# Patient Record
Sex: Female | Born: 1995 | Race: White | Hispanic: No | Marital: Single | Smoking: Never smoker
Health system: Southern US, Community
[De-identification: ages and names within clinical notes are randomized; demographics above are authoritative.]

## PROBLEM LIST (undated history)

## (undated) DIAGNOSIS — F419 Anxiety disorder, unspecified: Secondary | ICD-10-CM

## (undated) DIAGNOSIS — R609 Edema, unspecified: Secondary | ICD-10-CM

## (undated) DIAGNOSIS — R0602 Shortness of breath: Secondary | ICD-10-CM

## (undated) DIAGNOSIS — G43909 Migraine, unspecified, not intractable, without status migrainosus: Secondary | ICD-10-CM

## (undated) DIAGNOSIS — M549 Dorsalgia, unspecified: Secondary | ICD-10-CM

## (undated) DIAGNOSIS — F329 Major depressive disorder, single episode, unspecified: Secondary | ICD-10-CM

## (undated) DIAGNOSIS — M2550 Pain in unspecified joint: Secondary | ICD-10-CM

## (undated) DIAGNOSIS — F32A Depression, unspecified: Secondary | ICD-10-CM

## (undated) DIAGNOSIS — M25579 Pain in unspecified ankle and joints of unspecified foot: Secondary | ICD-10-CM

## (undated) HISTORY — DX: Edema, unspecified: R60.9

## (undated) HISTORY — DX: Shortness of breath: R06.02

## (undated) HISTORY — DX: Migraine, unspecified, not intractable, without status migrainosus: G43.909

## (undated) HISTORY — DX: Pain in unspecified joint: M25.50

## (undated) HISTORY — PX: WISDOM TOOTH EXTRACTION: SHX21

## (undated) HISTORY — DX: Major depressive disorder, single episode, unspecified: F32.9

## (undated) HISTORY — DX: Depression, unspecified: F32.A

## (undated) HISTORY — DX: Anxiety disorder, unspecified: F41.9

## (undated) HISTORY — DX: Pain in unspecified ankle and joints of unspecified foot: M25.579

---
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
## 2015-01-21 ENCOUNTER — Other Ambulatory Visit: Payer: Self-pay | Admitting: Family Medicine

## 2015-01-25 ENCOUNTER — Encounter: Payer: Self-pay | Admitting: Diagnostic Neuroimaging

## 2015-01-25 ENCOUNTER — Ambulatory Visit (INDEPENDENT_AMBULATORY_CARE_PROVIDER_SITE_OTHER): Payer: Federal, State, Local not specified - PPO | Admitting: Diagnostic Neuroimaging

## 2015-01-25 VITALS — BP 111/78 | HR 93 | Ht 67.0 in | Wt 248.0 lb

## 2015-01-25 DIAGNOSIS — G43009 Migraine without aura, not intractable, without status migrainosus: Secondary | ICD-10-CM

## 2015-01-25 MED ORDER — TOPIRAMATE 50 MG PO TABS: 50.0000 mg | ORAL_TABLET | Freq: Two times a day (BID) | ORAL | Status: DC

## 2015-01-25 NOTE — Patient Instructions (Signed)
Thank you for coming to see [place] at [facility]. I hope we have been able to provide you high quality care today.  You may receive a patient satisfaction survey over the next few weeks. We would appreciate your feedback and comments so that we may continue to improve ourselves and the health of our patients.  - continue current medication (topiramate)   ~~~~~~~~~~~~~~~~~~~~~~~~~~~~~~~~~~~~~~~~~~~~~~~~~~~~~~~~~~~~~~~~~  DR. PENUMALLI'S GUIDE TO HAPPY AND HEALTHY LIVING These are some of my general health and wellness recommendations. Some of them may apply to you better than others. Please use common sense as you try these suggestions and feel free to ask me any questions.   ACTIVITY/FITNESS Mental, social, emotional and physical stimulation are very important for brain and body health. Try learning a new activity (arts, music, language, sports, games).  Keep moving your body to the best of your abilities. You can do this at home, inside or outside, the park, community center, gym or anywhere you like. Consider a physical therapist or personal trainer to get started. Consider the app Sworkit. Fitness trackers such as smart-watches, smart-phones or Fitbits can help as well.   NUTRITION Eat more plants: colorful vegetables, nuts, seeds and berries.  Eat less sugar, salt, preservatives and processed foods.  Avoid toxins such as cigarettes and alcohol.  Drink water when you are thirsty. Warm water with a slice of lemon is an excellent morning drink to start the day.  Consider these websites for more information The Nutrition Source (https://www.henry-hernandez.biz/) Precision Nutrition (WindowBlog.ch)   RELAXATION Consider practicing mindfulness meditation or other relaxation techniques such as deep breathing, prayer, yoga, tai chi, massage. See website mindful.org or the apps Headspace or Calm to help get  started.   SLEEP Try to get at least 7-8+ hours sleep per day. Regular exercise and reduced caffeine will help you sleep better. Practice good sleep hygeine techniques. See website sleep.org for more information.   PLANNING Prepare estate planning, living will, healthcare POA documents. Sometimes this is best planned with the help of an attorney. Theconversationproject.org and agingwithdignity.org are excellent resources.

## 2015-01-25 NOTE — Progress Notes (Signed)
[facility]  PATIENT: Kylie Ware DOB: [birth year]  REFERRING CLINICIAN: Ruthe Mannan HISTORY FROM: patient REASON FOR VISIT: follow up    HISTORICAL  CHIEF COMPLAINT:  Chief Complaint  Patient presents with  . Migraine    rm 7, "HA related to lack of sleep"  . Follow-up    5 month    HISTORY OF PRESENT ILLNESS:   UPDATE 01/25/15: Since last visit, doing well. Occ has HA (1 every 2 weeks) but milder. Overall stable. Lack of sleep is still a trigger for HA. Currently home for winter break.  UPDATE 08/25/14: Since last visit, HA resolved, since increasing TPX to  BID. Some mild word finding diff. Overall doing well.   PRIOR HPI (07/13/14): 19 year old right-handed female here for evaluation of headaches. March 2016 patient had onset of daily headaches, bitemporal, retro-orbital, pressure and throbbing sensation with blurred vision, dizziness, balance difficulties. Her vision difficulties are described as if she is looking to the car windshield with rain, without using the windshield wipers. She reports some photophobia, phonophobia, nausea. No vomiting. Sometimes she sees "clear sparkles" during the headaches. Headaches may last all day or several hours at a time. She's had some headache at least on a daily basis since March. Patient had one episode of headache in ninth grade, with blurred vision and emergency room evaluation. She is not sure if she was ever diagnosed with migraine headache. She has family history of migraine in her father, who takes topiramate. January 2016 patient had onset of sleep disturbance, insomnia, related to change in her roommate sleeping pattern. Patient was then used to having a roommate, and when she was having to sleep alone, she had more difficulty. Typically she lays down around midnight, falls asleep around 2 AM, wakes up 2-3 times in the night, and then wakes up at 11:00 in the morning or 1:00 in the afternoon. Patient also  reports onset of depression and anxiety symptoms in August 2015. She is on medication and seeing a psychiatrist. No other changes in caffeine, stress, diet or exercise.   REVIEW OF SYSTEMS: Full 14 system review of systems performed and notable only for dizziness headache fatigue insomnia nausea blurred vision.    ALLERGIES: No Known Allergies  HOME MEDICATIONS: Outpatient Prescriptions Prior to Visit  Medication Sig Dispense Refill  . JUNEL 1/20 1-20 MG-MCG tablet TAKE 1 TABLET BY MOUTH DAILY 21 tablet 3  . sertraline (ZOLOFT) 25 MG tablet TAKE 1 TABLET BY MOUTH DAILY 30 tablet 9  . topiramate (TOPAMAX) 50 MG tablet Take 1 tablet (50 mg total) by mouth 2 (two) times daily. 180 tablet 4   No facility-administered medications prior to visit.    PAST MEDICAL HISTORY: Past Medical History  Diagnosis Date  . Migraines   . Depression   . Anxiety     PAST SURGICAL HISTORY: History reviewed. No pertinent past surgical history.  FAMILY HISTORY: Family History  Problem Relation Age of Onset  . Diabetes Father   . Cancer Maternal Grandmother 60    breast CA  . Cancer Mother     breast    SOCIAL HISTORY:  Social History   Social History  . Marital Status: Single    Spouse Name: N/A  . Number of Children: N/A  . Years of Education: N/A   Occupational History  . Not on file.   Social History Main Topics  . Smoking status: Never Smoker   . Smokeless tobacco: Not on file  . Alcohol Use:  No  . Drug Use: No  . Sexual Activity: Not on file   Other Topics Concern  . Not on file   Social History Narrative   Entering her senior year at [employer].   [employer].   Non smoking.      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     PHYSICAL EXAM  Filed Vitals:   01/25/15 1335  BP: 111/78  Pulse: 93  Height:  (1.702 m)  Weight: 248 lb (112.492 kg)   Wt Readings from Last 3 Encounters:  01/25/15 248 lb (112.492 kg) (99 %*, Z = 2.50)  08/25/14 232 lb (105.235 kg) (99 %*,  Z = 2.33)  07/13/14 237 lb 6.4 oz (107.684 kg) (99 %*, Z = 2.38)   * Growth percentiles are based on CDC 2-20 Years data.   Body mass index is 38.83 kg/(m^2).  GENERAL EXAM: Patient is in no distress; well developed, nourished and groomed; neck is supple; SOFT SPOKEN, CALM.  CARDIOVASCULAR: Regular rate and rhythm, no murmurs, no carotid bruits  NEUROLOGIC: MENTAL STATUS: awake, alert, language fluent, comprehension intact, naming intact, fund of knowledge appropriate CRANIAL NERVE: pupils equal and reactive to light, visual fields full to confrontation, extraocular muscles intact, no nystagmus, facial sensation and strength symmetric, hearing intact, palate elevates symmetrically, uvula midline, shoulder shrug symmetric, tongue midline. MOTOR: normal bulk and tone, full strength in the BUE, BLE SENSORY: normal and symmetric to light touch COORDINATION: finger-nose-finger, fine finger movements normal REFLEXES: deep tendon reflexes present and symmetric GAIT/STATION: narrow based gait; able to walk tandem; romberg is negative    DIAGNOSTIC DATA (LABS, IMAGING, TESTING) - I reviewed patient records, labs, notes, testing and imaging myself where available.  No results found for: WBC, HGB, HCT, MCV, PLT    Component Value Date/Time   NA 138 08/02/2012 1440   K 4.3 08/02/2012 1440   CL 104 08/02/2012 1440   CO2 28 08/02/2012 1440   GLUCOSE 89 08/02/2012 1440   BUN 13 08/02/2012 1440   CREATININE 0.60 08/02/2012 1440   CALCIUM 9.7 08/02/2012 1440   PROT 6.8 08/02/2012 1440   ALBUMIN 4.1 08/02/2012 1440   AST 12 08/02/2012 1440   ALT 15 08/02/2012 1440   ALKPHOS 85 08/02/2012 1440   BILITOT 0.2* 08/02/2012 1440   Lab Results  Component Value Date   CHOL 167 08/02/2012   HDL 68 08/02/2012   LDLCALC 86 08/02/2012   TRIG 64 08/02/2012   CHOLHDL 2.5 08/02/2012   Lab Results  Component Value Date   HGBA1C 5.4 08/02/2012   No results found for: VITAMINB12 No results  found for: TSH  11/26/09 CT head - normal [I reviewed images myself and agree with interpretation. -VRP]   08/25/14 Essentially normal MRI brain (with and without) demonstrating: 1. Minimal periventricular capping/gliosis and a single right frontal punctate focus of non-specific gliosis. These findings are non-specific and may be within normal limits.  2. No abnormal enhancing lesions. No acute findings.     ASSESSMENT AND PLAN  19 y.o. year old female here with new onset headaches since March 2016, with migraine features. Doing well on TPX  BID.  Dx: migraine with aura   PLAN: I spent 15 minutes of face to face time with patient. Greater than 50% of time was spent in counseling and coordination of care with patient. In summary we discussed:  - continue topiramate  BID - medicine benefits and side effects reviewed  Meds ordered this encounter  Medications  .  topiramate (TOPAMAX) 50 MG tablet    Sig: Take 1 tablet (50 mg total) by mouth 2 (two) times daily.    Dispense:  180 tablet    Refill:  4   Return in about 6 months (around 07/26/2015).    Suanne MarkerVIKRAM R. Shavonda Wiedman, MD 01/25/2015, 2:09 PM Certified in Neurology, Neurophysiology and Neuroimaging  [facility] [address], [address] [place], [place] [address] [phone]

## 2015-01-30 ENCOUNTER — Other Ambulatory Visit: Payer: Self-pay | Admitting: Family Medicine

## 2015-01-30 ENCOUNTER — Other Ambulatory Visit: Payer: Self-pay | Admitting: Diagnostic Neuroimaging

## 2015-03-15 ENCOUNTER — Other Ambulatory Visit: Payer: Self-pay | Admitting: Family Medicine

## 2015-03-16 NOTE — Telephone Encounter (Signed)
Lm on pts vm and informed her OV is required

## 2015-03-17 ENCOUNTER — Telehealth: Payer: Self-pay | Admitting: Family Medicine

## 2015-03-17 NOTE — Telephone Encounter (Signed)
Spoke to pt and informed her OV is required. Pt unable to schedule at the present time. Pt has not had f/u since 01/2014. #7 sent to pharmacy to last until she can be seen

## 2015-03-17 NOTE — Telephone Encounter (Signed)
Pts father (DPR not signed) left v/m requesting cb about pt not being able to get refill of zoloft until can come back in to office for appt; pt is student at [employer] and will have break in March.

## 2015-03-18 NOTE — Telephone Encounter (Signed)
Spoke to pt and advised father is not on [place]. Informed pt that i had spoken to Dr Dayton Martes, and a follow is required as indicated on her last Rx. Pt advised #7 was sent to pharmacy and once she has scheduled an appt, we can fill Rx until appt date, should she have to come in March. Pt not wanting to schedule an appt

## 2015-03-20 ENCOUNTER — Other Ambulatory Visit: Payer: Self-pay | Admitting: Family Medicine

## 2015-03-29 ENCOUNTER — Encounter: Payer: Self-pay | Admitting: Family Medicine

## 2015-03-29 ENCOUNTER — Ambulatory Visit (INDEPENDENT_AMBULATORY_CARE_PROVIDER_SITE_OTHER): Payer: Federal, State, Local not specified - PPO | Admitting: Family Medicine

## 2015-03-29 VITALS — BP 122/80 | HR 100 | Temp 98.4°F

## 2015-03-29 DIAGNOSIS — F329 Major depressive disorder, single episode, unspecified: Secondary | ICD-10-CM

## 2015-03-29 DIAGNOSIS — Z01419 Encounter for gynecological examination (general) (routine) without abnormal findings: Secondary | ICD-10-CM | POA: Insufficient documentation

## 2015-03-29 DIAGNOSIS — F419 Anxiety disorder, unspecified: Secondary | ICD-10-CM

## 2015-03-29 DIAGNOSIS — Z0000 Encounter for general adult medical examination without abnormal findings: Secondary | ICD-10-CM

## 2015-03-29 DIAGNOSIS — F418 Other specified anxiety disorders: Secondary | ICD-10-CM | POA: Diagnosis not present

## 2015-03-29 DIAGNOSIS — Z23 Encounter for immunization: Secondary | ICD-10-CM

## 2015-03-29 DIAGNOSIS — E669 Obesity, unspecified: Secondary | ICD-10-CM | POA: Diagnosis not present

## 2015-03-29 DIAGNOSIS — F32A Depression, unspecified: Secondary | ICD-10-CM

## 2015-03-29 DIAGNOSIS — Z3009 Encounter for other general counseling and advice on contraception: Secondary | ICD-10-CM

## 2015-03-29 LAB — TSH: TSH: 3.84 u[IU]/mL (ref 0.35–5.50)

## 2015-03-29 LAB — COMPREHENSIVE METABOLIC PANEL
ALBUMIN: 4.1 g/dL (ref 3.5–5.2)
ALK PHOS: 64 U/L (ref 39–117)
ALT: 18 U/L (ref 0–35)
AST: 13 U/L (ref 0–37)
BILIRUBIN TOTAL: 0.2 mg/dL (ref 0.2–1.2)
BUN: 10 mg/dL (ref 6–23)
CO2: 21 mEq/L (ref 19–32)
CREATININE: 0.66 mg/dL (ref 0.40–1.20)
Calcium: 9.4 mg/dL (ref 8.4–10.5)
Chloride: 109 mEq/L (ref 96–112)
GFR: 121.35 mL/min (ref >60.00)
GLUCOSE: 94 mg/dL (ref 70–99)
Potassium: 3.7 mEq/L (ref 3.5–5.1)
SODIUM: 138 meq/L (ref 135–145)
TOTAL PROTEIN: 7.2 g/dL (ref 6.0–8.3)

## 2015-03-29 LAB — CBC WITH DIFFERENTIAL/PLATELET
BASOS ABS: 0 10*3/uL (ref 0.0–0.1)
Basophils Relative: 0.5 % (ref 0.0–3.0)
EOS ABS: 0.2 10*3/uL (ref 0.0–0.7)
EOS PCT: 3.2 % (ref 0.0–5.0)
HCT: 39.3 % (ref 36.0–46.0)
HEMOGLOBIN: 12.9 g/dL (ref 12.0–15.0)
Lymphocytes Relative: 32.7 % (ref 12.0–46.0)
Lymphs Abs: 2.3 10*3/uL (ref 0.7–4.0)
MCHC: 32.9 g/dL (ref 30.0–36.0)
MCV: 83.6 fl (ref 78.0–100.0)
MONO ABS: 0.7 10*3/uL (ref 0.1–1.0)
Monocytes Relative: 9.7 % (ref 3.0–12.0)
NEUTROS PCT: 53.9 % (ref 43.0–77.0)
Neutro Abs: 3.9 10*3/uL (ref 1.4–7.7)
Platelets: 342 10*3/uL (ref 150.0–400.0)
RBC: 4.7 Mil/uL (ref 3.87–5.11)
RDW: 14.5 % (ref 11.5–14.6)
WBC: 7.2 10*3/uL (ref 4.5–10.5)

## 2015-03-29 LAB — LIPID PANEL
CHOLESTEROL: 212 mg/dL — AB (ref 0–200)
HDL: 76.9 mg/dL (ref >39.00)
LDL Cholesterol: 116 mg/dL — ABNORMAL HIGH (ref 0–99)
NONHDL: 135.52
Total CHOL/HDL Ratio: 3
Triglycerides: 98 mg/dL (ref 0.0–149.0)
VLDL: 19.6 mg/dL (ref 0.0–40.0)

## 2015-03-29 MED ORDER — SERTRALINE HCL 25 MG PO TABS: 25.0000 mg | ORAL_TABLET | Freq: Every day | ORAL | Status: DC

## 2015-03-29 NOTE — Addendum Note (Signed)
Addended by: Eual Fines on: 03/29/2015 09:44 AM   Modules accepted: Orders

## 2015-03-29 NOTE — Progress Notes (Signed)
Pre visit review using our clinic review tool, if applicable. No additional management support is needed unless otherwise documented below in the visit note. 

## 2015-03-29 NOTE — Progress Notes (Signed)
Subjective:    Patient ID: Kylie Ware, female    DOB: Jan 24, 1996, 20 y.o.   MRN: [id number]  HPI  Very pleasant 20 yo female here for CPX and follow up of chronic medical conditions.  Received gardasil series- see scanned immunization records under "media" tab.  She is taking Junel OCPs which have helped with her menorrhagia and cramping. Sexually active with her boyfriend.  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 Attending App- she enjoys it.  Obesity- has been an issue her entire life.  Admits to snacking too much and making poor food choices.  Does not like to exercise. Likes playing video games.  Migraines- followed by neurology. Last saw Dr. Marjory Lies on 01/24/15.  Note reviewed.  Advised to continue Topamax 50 mg twice daily for migraine prevention. Migraines are stress induced.  Improved.  Zoloft- symptoms are well controlled.  Denies any anxiety or depression.   Wt Readings from Last 3 Encounters:  01/25/15 248 lb (112.492 kg) (99 %*, Z = 2.50)  08/25/14 232 lb (105.235 kg) (99 %*, Z = 2.33)  07/13/14 237 lb 6.4 oz (107.684 kg) (99 %*, Z = 2.38)   * Growth percentiles are based on CDC 2-20 Years data.     Lab Results  Component Value Date   CHOL 167 08/02/2012   HDL 68 08/02/2012   LDLCALC 86 08/02/2012   TRIG 64 08/02/2012   CHOLHDL 2.5 08/02/2012   Lab Results  Component Value Date   CREATININE 0.60 08/02/2012   Lab Results  Component Value Date   NA 138 08/02/2012   K 4.3 08/02/2012   CL 104 08/02/2012   CO2 28 08/02/2012   Lab Results  Component Value Date   HGBA1C 5.4 08/02/2012    Patient Active Problem List   Diagnosis Date Noted  . Well woman exam 03/29/2015  . Anxiety and depression 01/20/2014  . Encounter for other general counseling or advice on contraception 01/20/2014  . Obesity, unspecified 08/02/2012   Past Medical History  Diagnosis Date  . Migraines   . Depression   . Anxiety    No past surgical history on file. Social  History  Substance Use Topics  . Smoking status: Never Smoker   . Smokeless tobacco: Never Used  . Alcohol Use: No   Family History  Problem Relation Age of Onset  . Diabetes Father   . Cancer Maternal Grandmother 60    breast CA  . Cancer Mother     breast   No Known Allergies Current Outpatient Prescriptions on File Prior to Visit  Medication Sig Dispense Refill  . JUNEL 1/20 1-20 MG-MCG tablet TAKE 1 TABLET BY MOUTH DAILY 21 tablet 3  . sertraline (ZOLOFT) 25 MG tablet Take 1 tablet (25 mg total) by mouth daily. OFFICE VISIT REQUIRED FOR ADDITIONAL REFILLS 7 tablet 0  . topiramate (TOPAMAX) 50 MG tablet TAKE 1 TABLET (50 MG TOTAL) BY MOUTH 2 (TWO) TIMES DAILY. 60 tablet 12   No current facility-administered medications on file prior to visit.   The PMH, PSH, Social History, Family History, Medications, and allergies have been reviewed in [facility], and have been updated if relevant.   Review of Systems  Constitutional: Negative.   HENT: Negative.   Respiratory: Negative.   Cardiovascular: Negative.   Gastrointestinal: Negative.   Endocrine: Negative.   Genitourinary: Negative.   Musculoskeletal: Negative.   Allergic/Immunologic: Negative.   Neurological: Negative.   Hematological: Negative.   Psychiatric/Behavioral: Negative.   All other systems reviewed  and are negative.      Objective:   Physical Exam BP 122/80 mmHg  Pulse 100  Temp(Src) 98.4 F (36.9 C) (Oral)  LMP 03/24/2015  General:  Obese, Well-developed,well-nourished,in no acute distress; alert,appropriate and cooperative throughout examination Head:  normocephalic and atraumatic.   Eyes:  vision grossly intact, pupils equal, pupils round, and pupils reactive to light.   Ears:  R ear normal and L ear normal.   Nose:  no external deformity.   Mouth:  good dentition.   Lungs:  Normal respiratory effort, chest expands symmetrically. Lungs are clear to auscultation, no crackles or wheezes. Heart:  Normal  rate and regular rhythm. S1 and S2 normal without gallop, murmur, click, rub or other extra sounds. Abdomen:  Bowel sounds positive,abdomen soft and non-tender without masses, organomegaly or hernias noted. Msk:  No deformity or scoliosis noted of thoracic or lumbar spine.   Extremities:  No clubbing, cyanosis, edema, or deformity noted with normal full range of motion of all joints.   Neurologic:  alert & oriented X3 and gait normal.   Skin:  Intact without suspicious lesions or rashes Cervical Nodes:  No lymphadenopathy noted Axillary Nodes:  No palpable lymphadenopathy Psych:  Cognition and judgment appear intact. Alert and cooperative with normal attention span and concentration. No apparent delusions, illusions, hallucinations    Assessment & Plan:

## 2015-03-29 NOTE — Assessment & Plan Note (Signed)
Well controlled on current dose of Zoloft. No changes made to rxs today.

## 2015-03-29 NOTE — Addendum Note (Signed)
Addended by: Dianne Dun on: 03/29/2015 09:39 AM   Modules accepted: Orders, SmartSet

## 2015-03-29 NOTE — Assessment & Plan Note (Signed)
Discussed dangers of smoking, alcohol, and drug abuse.  Also discussed sexual activity, pregnancy risk, and STD risk.  Encouraged to get regular exercise.  Labs today.

## 2015-03-29 NOTE — Patient Instructions (Signed)
Great to see you! Happy birthday! 

## 2015-03-30 ENCOUNTER — Encounter: Payer: Self-pay | Admitting: *Deleted

## 2015-05-24 ENCOUNTER — Other Ambulatory Visit: Payer: Self-pay | Admitting: Family Medicine

## 2015-07-01 ENCOUNTER — Ambulatory Visit (INDEPENDENT_AMBULATORY_CARE_PROVIDER_SITE_OTHER): Payer: Federal, State, Local not specified - PPO | Admitting: Family Medicine

## 2015-07-01 ENCOUNTER — Encounter: Payer: Self-pay | Admitting: Family Medicine

## 2015-07-01 VITALS — BP 114/72 | HR 93 | Temp 97.8°F | Wt 259.0 lb

## 2015-07-01 DIAGNOSIS — M62830 Muscle spasm of back: Secondary | ICD-10-CM

## 2015-07-01 MED ORDER — CYCLOBENZAPRINE HCL 5 MG PO TABS
5.0000 mg | ORAL_TABLET | Freq: Three times a day (TID) | ORAL | Status: DC | PRN
Start: 2015-07-01 — End: 2018-07-22

## 2015-07-01 NOTE — Patient Instructions (Signed)
Great to see you. You can take Ibuprofen up to 800 mg three times daily with food for next 1- 2 weeks. Flexeril as needed at bedtime for spasm.

## 2015-07-01 NOTE — Progress Notes (Signed)
SUBJECTIVE:  Kylie Ware is a 20 y.o. female who complains of an injury causing low back pain 1 week(s) ago. The pain is positional with bending or lifting, without radiation down the legs. Mechanism of injury: lifting heavy objects moving from her dorm. Symptoms have been intermittent since that time. Prior history of back problems: recurrent self limited episodes of low back pain in the past. There is no numbness in the legs.  Current Outpatient Prescriptions on File Prior to Visit  Medication Sig Dispense Refill  . JUNEL 1/20 1-20 MG-MCG tablet TAKE 1 TABLET BY MOUTH DAILY 21 tablet 3  . sertraline (ZOLOFT) 25 MG tablet Take 1 tablet (25 mg total) by mouth daily. 30 tablet 6  . topiramate (TOPAMAX) 50 MG tablet TAKE 1 TABLET (50 MG TOTAL) BY MOUTH 2 (TWO) TIMES DAILY. 60 tablet 12   No current facility-administered medications on file prior to visit.    No Known Allergies  Past Medical History  Diagnosis Date  . Migraines   . Depression   . Anxiety     No past surgical history on file.  Family History  Problem Relation Age of Onset  . Diabetes Father   . Cancer Maternal Grandmother 60    breast CA  . Cancer Mother     breast    Social History   Social History  . Marital Status: Single    Spouse Name: N/A  . Number of Children: N/A  . Years of Education: N/A   Occupational History  . Not on file.   Social History Main Topics  . Smoking status: Never Smoker   . Smokeless tobacco: Never Used  . Alcohol Use: No  . Drug Use: No  . Sexual Activity: Not on file   Other Topics Concern  . Not on file   Social History Narrative   Attending App   Sexually active   Non smoking.      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   The PMH, PSH, Social History, Family History, Medications, and [facility], and have been updated if relevant.  OBJECTIVE: BP 114/72 mmHg  Pulse 93  Temp(Src) 97.8 F (36.6 C) (Oral)  Wt 259 lb (117.482 kg)  SpO2  98%  Vital signs as noted above. Patient appears to be in mild to moderate pain, antalgic gait noted. Lumbosacral spine area reveals no local tenderness or mass. Painful and reduced LS ROM noted. Straight leg raise is negative bilaterally.  DTR's, motor strength and sensation normal, including heel and toe gait.  Peripheral pulses are palpable. Lumbar spine X-Ray: not indicated.   ASSESSMENT:  lumbar strain  PLAN: eRx sent for flexeril- discussed sedation precautions. For acute pain, rest, intermittent application of cold packs (later, may switch to heat, but do not sleep on heating pad), analgesics and muscle relaxants are recommended. Discussed longer term treatment plan of prn NSAID's and discussed a home back care exercise program with flexion exercise routine. Proper lifting with avoidance of heavy lifting discussed. Consider Physical Therapy and XRay studies if not improving. Call or return to clinic prn if these symptoms worsen or fail to improve as anticipated.

## 2015-07-01 NOTE — Progress Notes (Signed)
Pre visit review using our clinic review tool, if applicable. No additional management support is needed unless otherwise documented below in the visit note. 

## 2015-07-06 ENCOUNTER — Encounter: Payer: Self-pay | Admitting: Family Medicine

## 2015-07-07 ENCOUNTER — Other Ambulatory Visit: Payer: Self-pay | Admitting: Family Medicine

## 2015-07-07 DIAGNOSIS — M5416 Radiculopathy, lumbar region: Secondary | ICD-10-CM

## 2015-07-12 DIAGNOSIS — M545 Low back pain: Secondary | ICD-10-CM | POA: Diagnosis not present

## 2015-07-21 DIAGNOSIS — G8929 Other chronic pain: Secondary | ICD-10-CM | POA: Diagnosis not present

## 2015-07-21 DIAGNOSIS — M545 Low back pain: Secondary | ICD-10-CM | POA: Diagnosis not present

## 2015-07-26 ENCOUNTER — Encounter: Payer: Self-pay | Admitting: Diagnostic Neuroimaging

## 2015-07-26 ENCOUNTER — Ambulatory Visit (INDEPENDENT_AMBULATORY_CARE_PROVIDER_SITE_OTHER): Payer: Federal, State, Local not specified - PPO | Admitting: Diagnostic Neuroimaging

## 2015-07-26 VITALS — BP 110/70 | HR 96 | Ht 67.0 in | Wt 255.6 lb

## 2015-07-26 DIAGNOSIS — G43009 Migraine without aura, not intractable, without status migrainosus: Secondary | ICD-10-CM | POA: Diagnosis not present

## 2015-07-26 DIAGNOSIS — M5416 Radiculopathy, lumbar region: Secondary | ICD-10-CM | POA: Diagnosis not present

## 2015-07-26 MED ORDER — TOPIRAMATE 50 MG PO TABS: 50.0000 mg | ORAL_TABLET | Freq: Two times a day (BID) | ORAL | Status: DC

## 2015-07-26 NOTE — Progress Notes (Signed)
[facility]  PATIENT: Kylie Ware DOB: 29-Dec-1995  REFERRING CLINICIAN: Ruthe MannanAron, Talia HISTORY FROM: patient REASON FOR VISIT: follow up    HISTORICAL  CHIEF COMPLAINT:  Chief Complaint  Patient presents with  . Migraine    rm 7, "migraines less often now but my exams are over too, they were very stressful, caused nausea, vomiting, but I've worse in the past"  . Follow-up    6 month    HISTORY OF PRESENT ILLNESS:   UPDATE 07/26/15: Since last visit, doing well. Avg 1 HA per month. During classes and finals, HA were worse. Now doing better. Tolerating TPX.  UPDATE 01/25/15: Since last visit, doing well. Occ has HA (1 every 2 weeks) but milder. Overall stable. Lack of sleep is still a trigger for HA. Currently home for winter break.  UPDATE 08/25/14: Since last visit, HA resolved, since increasing TPX to 50mg  BID. Some mild word finding diff. Overall doing well.   PRIOR HPI (07/13/14): 20 year old right-handed female here for evaluation of headaches. March 2016 patient had onset of daily headaches, bitemporal, retro-orbital, pressure and throbbing sensation with blurred vision, dizziness, balance difficulties. Her vision difficulties are described as if she is looking to the car windshield with rain, without using the windshield wipers. She reports some photophobia, phonophobia, nausea. No vomiting. Sometimes she sees "clear sparkles" during the headaches. Headaches may last all day or several hours at a time. She's had some headache at least on a daily basis since March. Patient had one episode of headache in ninth grade, with blurred vision and emergency room evaluation. She is not sure if she was ever diagnosed with migraine headache. She has family history of migraine in her father, who takes topiramate. January 2016 patient had onset of sleep disturbance, insomnia, related to change in her roommate sleeping pattern. Patient was then used to having a roommate, and  when she was having to sleep alone, she had more difficulty. Typically she lays down around midnight, falls asleep around 2 AM, wakes up 2-3 times in the night, and then wakes up at 11:00 in the morning or 1:00 in the afternoon. Patient also reports onset of depression and anxiety symptoms in August 2015. She is on medication and seeing a psychiatrist. No other changes in caffeine, stress, diet or exercise.   REVIEW OF SYSTEMS: Full 14 system review of systems performed and negative except: loss of vision (with HA) memory loss dizzienss speech diff depression anxiety insomnia sleep talking.   ALLERGIES: No Known Allergies  HOME MEDICATIONS: Outpatient Prescriptions Prior to Visit  Medication Sig Dispense Refill  . cyclobenzaprine (FLEXERIL) 5 MG tablet Take 1 tablet (5 mg total) by mouth 3 (three) times daily as needed for muscle spasms. 30 tablet 1  . JUNEL 1/20 1-20 MG-MCG tablet TAKE 1 TABLET BY MOUTH DAILY 21 tablet 3  . sertraline (ZOLOFT) 25 MG tablet Take 1 tablet (25 mg total) by mouth daily. 30 tablet 6  . topiramate (TOPAMAX) 50 MG tablet TAKE 1 TABLET (50 MG TOTAL) BY MOUTH 2 (TWO) TIMES DAILY. 60 tablet 12   No facility-administered medications prior to visit.    PAST MEDICAL HISTORY: Past Medical History  Diagnosis Date  . Migraines   . Depression   . Anxiety     PAST SURGICAL HISTORY: No past surgical history on file.  FAMILY HISTORY: Family History  Problem Relation Age of Onset  . Diabetes Father   . Cancer Maternal Grandmother 60    breast CA  .  Cancer Mother     breast    SOCIAL HISTORY:  Social History   Social History  . Marital Status: Single    Spouse Name: N/A  . Number of Children: N/A  . Years of Education: N/A   Occupational History  . Not on file.   Social History Main Topics  . Smoking status: Never Smoker   . Smokeless tobacco: Never Used  . Alcohol Use: No  . Drug Use: No  . Sexual Activity: Not on file   Other Topics Concern    . Not on file   Social History Narrative   Attending App   Sexually active   Non smoking.      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     PHYSICAL EXAM  Filed Vitals:   07/26/15 1251  BP: 110/70  Pulse: 96  Height: 5\' 7"  (1.702 m)  Weight: 255 lb 9.6 oz (115.939 kg)   Wt Readings from Last 3 Encounters:  07/26/15 255 lb 9.6 oz (115.939 kg)  07/01/15 259 lb (117.482 kg)  01/25/15 248 lb (112.492 kg) (99 %*, Z = 2.50)   * Growth percentiles are based on CDC 2-20 Years data.   Body mass index is 40.02 kg/(m^2).  GENERAL EXAM: Patient is in no distress; well developed, nourished and groomed; neck is supple; SOFT SPOKEN, CALM.  CARDIOVASCULAR: Regular rate and rhythm, no murmurs, no carotid bruits  NEUROLOGIC: MENTAL STATUS: awake, alert, language fluent, comprehension intact, naming intact, fund of knowledge appropriate CRANIAL NERVE: pupils equal and reactive to light, visual fields full to confrontation, extraocular muscles intact, no nystagmus, facial sensation and strength symmetric, hearing intact, palate elevates symmetrically, uvula midline, shoulder shrug symmetric, tongue midline. MOTOR: normal bulk and tone, full strength in the BUE, BLE SENSORY: normal and symmetric to light touch COORDINATION: finger-nose-finger, fine finger movements normal REFLEXES: deep tendon reflexes present and symmetric GAIT/STATION: narrow based gait    DIAGNOSTIC DATA (LABS, IMAGING, TESTING) - I reviewed patient records, labs, notes, testing and imaging myself where available.  Lab Results  Component Value Date   WBC 7.2 03/29/2015   HGB 12.9 03/29/2015   HCT 39.3 03/29/2015   MCV 83.6 03/29/2015   PLT 342.0 03/29/2015      Component Value Date/Time   NA 138 03/29/2015 0954   K 3.7 03/29/2015 0954   CL 109 03/29/2015 0954   CO2 21 03/29/2015 0954   GLUCOSE 94 03/29/2015 0954   BUN 10 03/29/2015 0954   CREATININE 0.66 03/29/2015 0954   CREATININE 0.60 08/02/2012 1440    CALCIUM 9.4 03/29/2015 0954   PROT 7.2 03/29/2015 0954   ALBUMIN 4.1 03/29/2015 0954   AST 13 03/29/2015 0954   ALT 18 03/29/2015 0954   ALKPHOS 64 03/29/2015 0954   BILITOT 0.2 03/29/2015 0954   Lab Results  Component Value Date   CHOL 212* 03/29/2015   HDL 76.90 03/29/2015   LDLCALC 116* 03/29/2015   TRIG 98.0 03/29/2015   CHOLHDL 3 03/29/2015   Lab Results  Component Value Date   HGBA1C 5.4 08/02/2012   No results found for: VITAMINB12 Lab Results  Component Value Date   TSH 3.84 03/29/2015    11/26/09 CT head - normal [I reviewed images myself and agree with interpretation. -VRP]   08/25/14 Essentially normal MRI brain (with and without) demonstrating: 1. Minimal periventricular capping/gliosis and a single right frontal punctate focus of non-specific gliosis. These findings are non-specific and may be within normal limits.  2. No  abnormal enhancing lesions. No acute findings.     ASSESSMENT AND PLAN  20 y.o. year old female here with new onset headaches since March 2016, with migraine features. Doing well on TPX  BID.  Dx:   Migraine without aura and without status migrainosus, not intractable  Lumbar radiculopathy     PLAN: - continue topiramate  BID - medicine benefits and side effects reviewed - advised patient to continue PT for lumbar radiculopathy (to the right leg); also reviewed yoga stretching, nutrition and other techniques to alleviate pain  Meds ordered this encounter  Medications  . topiramate (TOPAMAX) 50 MG tablet    Sig: Take 1 tablet (50 mg total) by mouth 2 (two) times daily.    Dispense:  180 tablet    Refill:  4   Return in about 6 months (around 01/25/2016).    Suanne Marker, MD 07/26/2015, 1:10 PM Certified in Neurology, Neurophysiology and Neuroimaging  [facility] [address], [address] [place], [place] [address] [phone]

## 2015-07-26 NOTE — Patient Instructions (Signed)

## 2015-07-30 DIAGNOSIS — G8929 Other chronic pain: Secondary | ICD-10-CM | POA: Diagnosis not present

## 2015-07-30 DIAGNOSIS — M545 Low back pain: Secondary | ICD-10-CM | POA: Diagnosis not present

## 2015-08-06 DIAGNOSIS — M545 Low back pain: Secondary | ICD-10-CM | POA: Diagnosis not present

## 2015-08-06 DIAGNOSIS — G8929 Other chronic pain: Secondary | ICD-10-CM | POA: Diagnosis not present

## 2015-08-13 DIAGNOSIS — G8929 Other chronic pain: Secondary | ICD-10-CM | POA: Diagnosis not present

## 2015-08-13 DIAGNOSIS — M545 Low back pain: Secondary | ICD-10-CM | POA: Diagnosis not present

## 2015-08-17 DIAGNOSIS — G8929 Other chronic pain: Secondary | ICD-10-CM | POA: Diagnosis not present

## 2015-08-17 DIAGNOSIS — M545 Low back pain: Secondary | ICD-10-CM | POA: Diagnosis not present

## 2015-08-23 ENCOUNTER — Other Ambulatory Visit: Payer: Self-pay | Admitting: Sports Medicine

## 2015-08-23 DIAGNOSIS — M545 Low back pain: Secondary | ICD-10-CM

## 2015-08-24 DIAGNOSIS — M545 Low back pain: Secondary | ICD-10-CM | POA: Diagnosis not present

## 2015-08-24 DIAGNOSIS — G8929 Other chronic pain: Secondary | ICD-10-CM | POA: Diagnosis not present

## 2015-08-31 DIAGNOSIS — M545 Low back pain: Secondary | ICD-10-CM | POA: Diagnosis not present

## 2015-08-31 DIAGNOSIS — G8929 Other chronic pain: Secondary | ICD-10-CM | POA: Diagnosis not present

## 2015-09-01 DIAGNOSIS — M545 Low back pain: Secondary | ICD-10-CM | POA: Diagnosis not present

## 2015-09-01 DIAGNOSIS — G8929 Other chronic pain: Secondary | ICD-10-CM | POA: Diagnosis not present

## 2015-09-07 DIAGNOSIS — M545 Low back pain: Secondary | ICD-10-CM | POA: Diagnosis not present

## 2015-09-08 DIAGNOSIS — M5126 Other intervertebral disc displacement, lumbar region: Secondary | ICD-10-CM | POA: Diagnosis not present

## 2015-09-08 DIAGNOSIS — M545 Low back pain: Secondary | ICD-10-CM | POA: Diagnosis not present

## 2015-10-05 ENCOUNTER — Other Ambulatory Visit: Payer: Self-pay | Admitting: Family Medicine

## 2015-10-16 ENCOUNTER — Other Ambulatory Visit: Payer: Self-pay | Admitting: Family Medicine

## 2015-11-18 DIAGNOSIS — K080 Exfoliation of teeth due to systemic causes: Secondary | ICD-10-CM | POA: Diagnosis not present

## 2016-01-26 ENCOUNTER — Ambulatory Visit (INDEPENDENT_AMBULATORY_CARE_PROVIDER_SITE_OTHER): Payer: Federal, State, Local not specified - PPO | Admitting: Diagnostic Neuroimaging

## 2016-01-26 ENCOUNTER — Encounter: Payer: Self-pay | Admitting: Diagnostic Neuroimaging

## 2016-01-26 VITALS — BP 124/85 | HR 95 | Wt 266.8 lb

## 2016-01-26 DIAGNOSIS — M5416 Radiculopathy, lumbar region: Secondary | ICD-10-CM

## 2016-01-26 DIAGNOSIS — G43009 Migraine without aura, not intractable, without status migrainosus: Secondary | ICD-10-CM | POA: Diagnosis not present

## 2016-01-26 MED ORDER — RIZATRIPTAN BENZOATE 10 MG PO TBDP: 10.0000 mg | ORAL_TABLET | ORAL | 11 refills | Status: DC | PRN

## 2016-01-26 MED ORDER — TOPIRAMATE 50 MG PO TABS: 50.0000 mg | ORAL_TABLET | Freq: Two times a day (BID) | ORAL | 4 refills | Status: DC

## 2016-01-26 NOTE — Progress Notes (Signed)
[facility]  PATIENT: Kylie Ware DOB: 1995/05/15  REFERRING CLINICIAN: Ruthe MannanAron, Talia HISTORY FROM: patient REASON FOR VISIT: follow up    HISTORICAL  CHIEF COMPLAINT:  Chief Complaint  Patient presents with  . Migraine    rm 6, "headaches, migraines are about the same. I get them from lack of sleep and sress."  . Follow-up    6 month    HISTORY OF PRESENT ILLNESS:   UPDATE 01/26/16: Since last visit, now with more HA (now every other day), related to school stress. Overall tolerating TPX. Using ibuprofen or naproxen for migraine rescue, but not as effective anymore.   UPDATE 07/26/15: Since last visit, doing well. Avg 1 HA per month. During classes and finals, HA were worse. Now doing better. Tolerating TPX.  UPDATE 01/25/15: Since last visit, doing well. Occ has HA (1 every 2 weeks) but milder. Overall stable. Lack of sleep is still a trigger for HA. Currently home for winter break.  UPDATE 08/25/14: Since last visit, HA resolved, since increasing TPX to 50mg  BID. Some mild word finding diff. Overall doing well.   PRIOR HPI (07/13/14): 20 year old right-handed female here for evaluation of headaches. March 2016 patient had onset of daily headaches, bitemporal, retro-orbital, pressure and throbbing sensation with blurred vision, dizziness, balance difficulties. Her vision difficulties are described as if she is looking to the car windshield with rain, without using the windshield wipers. She reports some photophobia, phonophobia, nausea. No vomiting. Sometimes she sees "clear sparkles" during the headaches. Headaches may last all day or several hours at a time. She's had some headache at least on a daily basis since March. Patient had one episode of headache in ninth grade, with blurred vision and emergency room evaluation. She is not sure if she was ever diagnosed with migraine headache. She has family history of migraine in her father, who takes topiramate.  January 2016 patient had onset of sleep disturbance, insomnia, related to change in her roommate sleeping pattern. Patient was then used to having a roommate, and when she was having to sleep alone, she had more difficulty. Typically she lays down around midnight, falls asleep around 2 AM, wakes up 2-3 times in the night, and then wakes up at 11:00 in the morning or 1:00 in the afternoon. Patient also reports onset of depression and anxiety symptoms in August 2015. She is on medication and seeing a psychiatrist. No other changes in caffeine, stress, diet or exercise.   REVIEW OF SYSTEMS: Full 14 system review of systems performed and negative except: loss of vision (with HA) memory loss dizzienss speech diff depression anxiety insomnia sleep talking.   ALLERGIES: No Known Allergies  HOME MEDICATIONS: Outpatient Medications Prior to Visit  Medication Sig Dispense Refill  . cyclobenzaprine (FLEXERIL) 5 MG tablet Take 1 tablet (5 mg total) by mouth 3 (three) times daily as needed for muscle spasms. 30 tablet 1  . JUNEL 1/20 1-20 MG-MCG tablet TAKE 1 TABLET BY MOUTH DAILY 21 tablet 5  . naproxen (NAPROSYN) 500 MG tablet 500 mg.  2  . sertraline (ZOLOFT) 25 MG tablet TAKE 1 TABLET BY MOUTH DAILY 90 tablet 1  . topiramate (TOPAMAX) 50 MG tablet Take 1 tablet (50 mg total) by mouth 2 (two) times daily. 180 tablet 4   No facility-administered medications prior to visit.     PAST MEDICAL HISTORY: Past Medical History:  Diagnosis Date  . Anxiety   . Depression   . Migraines     PAST  SURGICAL HISTORY: History reviewed. No pertinent surgical history.  FAMILY HISTORY: Family History  Problem Relation Age of Onset  . Diabetes Father   . Cancer Mother     breast  . Cancer Maternal Grandmother 65    breast CA    SOCIAL HISTORY:  Social History   Social History  . Marital status: Single    Spouse name: N/A  . Number of children: N/A  . Years of education: N/A   Occupational History   . Not on file.   Social History Main Topics  . Smoking status: Never Smoker  . Smokeless tobacco: Never Used  . Alcohol use No  . Drug use: No  . Sexual activity: Not on file   Other Topics Concern  . Not on file   Social History Narrative   Attending App   Sexually active   Non smoking.      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     PHYSICAL EXAM  Vitals:   01/26/16 1132  BP: 124/85  Pulse: 95  Weight: 266 lb 12.8 oz (121 kg)   Wt Readings from Last 3 Encounters:  01/26/16 266 lb 12.8 oz (121 kg)  07/26/15 255 lb 9.6 oz (115.9 kg)  07/01/15 259 lb (117.5 kg)   Body mass index is 41.79 kg/m.  GENERAL EXAM: Patient is in no distress; well developed, nourished and groomed; neck is supple; SOFT SPOKEN, CALM.  CARDIOVASCULAR: Regular rate and rhythm, no murmurs, no carotid bruits  NEUROLOGIC: MENTAL STATUS: awake, alert, language fluent, comprehension intact, naming intact, fund of knowledge appropriate CRANIAL NERVE: pupils equal and reactive to light, visual fields full to confrontation, extraocular muscles intact, no nystagmus, facial sensation and strength symmetric, hearing intact, palate elevates symmetrically, uvula midline, shoulder shrug symmetric, tongue midline. MOTOR: normal bulk and tone, full strength in the BUE, BLE SENSORY: normal and symmetric to light touch COORDINATION: finger-nose-finger, fine finger movements normal REFLEXES: deep tendon reflexes present and symmetric GAIT/STATION: narrow based gait    DIAGNOSTIC DATA (LABS, IMAGING, TESTING) - I reviewed patient records, labs, notes, testing and imaging myself where available.  Lab Results  Component Value Date   WBC 7.2 03/29/2015   HGB 12.9 03/29/2015   HCT 39.3 03/29/2015   MCV 83.6 03/29/2015   PLT 342.0 03/29/2015      Component Value Date/Time   NA 138 03/29/2015 0954   K 3.7 03/29/2015 0954   CL 109 03/29/2015 0954   CO2 21 03/29/2015 0954   GLUCOSE 94 03/29/2015 0954   BUN  10 03/29/2015 0954   CREATININE 0.66 03/29/2015 0954   CREATININE 0.60 08/02/2012 1440   CALCIUM 9.4 03/29/2015 0954   PROT 7.2 03/29/2015 0954   ALBUMIN 4.1 03/29/2015 0954   AST 13 03/29/2015 0954   ALT 18 03/29/2015 0954   ALKPHOS 64 03/29/2015 0954   BILITOT 0.2 03/29/2015 0954   Lab Results  Component Value Date   CHOL 212 (H) 03/29/2015   HDL 76.90 03/29/2015   LDLCALC 116 (H) 03/29/2015   TRIG 98.0 03/29/2015   CHOLHDL 3 03/29/2015   Lab Results  Component Value Date   HGBA1C 5.4 08/02/2012   No results found for: VITAMINB12 Lab Results  Component Value Date   TSH 3.84 03/29/2015    11/26/09 CT head - normal [I reviewed images myself and agree with interpretation. -VRP]   08/25/14 Essentially normal MRI brain (with and without) demonstrating: 1. Minimal periventricular capping/gliosis and a single right frontal punctate focus of  non-specific gliosis. These findings are non-specific and may be within normal limits.  2. No abnormal enhancing lesions. No acute findings.     ASSESSMENT AND PLAN  20 y.o. year old female here with new onset headaches since March 2016, with migraine features. Was doing well on TPX 50mg  twice a day.   Dx:   Migraine without aura and without status migrainosus, not intractable  Lumbar radiculopathy    PLAN: - MIGRAINE PREVENTION: continue topiramate 50mg  twice a day - MIGRAINE RESCUE: ibuprofen as needed; trial of rizatriptan as needed - medicine benefits and side effects reviewed - continue PT for lumbar radiculopathy (to the right leg); also reviewed yoga stretching, nutrition and other techniques to alleviate pain  Meds ordered this encounter  Medications  . topiramate (TOPAMAX) 50 MG tablet    Sig: Take 1 tablet (50 mg total) by mouth 2 (two) times daily.    Dispense:  180 tablet    Refill:  4  . rizatriptan (MAXALT-MLT) 10 MG disintegrating tablet    Sig: Take 1 tablet (10 mg total) by mouth as needed for migraine. May  repeat in 2 hours if needed    Dispense:  9 tablet    Refill:  11   Return in about 6 months (around 07/26/2016).    Suanne MarkerVIKRAM R. Keisha Amer, MD 01/26/2016, 12:09 PM Certified in Neurology, Neurophysiology and Neuroimaging  [facility] [address], [address] [place], [place] [address] [phone]

## 2016-01-26 NOTE — Patient Instructions (Signed)
-   continue topiramate 50mg  twice a day; drink plenty of water  - rizatriptan 10mg  as needed for breakthrough headache; may repeat x 1 after 2 hours; max 2 tabs per day or 8 per month  - To prevent or relieve headaches, try the following:   Cool Compress. Lie down and place a cool compress on your head.   Avoid headache triggers. If certain foods or odors seem to have triggered your migraines in the past, avoid them. A headache diary might help you identify triggers.   Include physical activity in your daily routine.   Manage stress. Find healthy ways to cope with the stressors, such as delegating tasks on your to-do list.   Practice relaxation techniques. Try deep breathing, yoga, massage and visualization.   Eat regularly. Eating regularly scheduled meals and maintaining a healthy diet might help prevent headaches. Also, drink plenty of fluids.   Follow a regular sleep schedule. Sleep deprivation might contribute to headaches  Consider biofeedback. With this mind-body technique, you learn to control certain bodily functions - such as muscle tension, heart rate and blood pressure - to prevent headaches or reduce headache pain.

## 2016-01-27 DIAGNOSIS — G8929 Other chronic pain: Secondary | ICD-10-CM | POA: Diagnosis not present

## 2016-01-27 DIAGNOSIS — M5126 Other intervertebral disc displacement, lumbar region: Secondary | ICD-10-CM | POA: Diagnosis not present

## 2016-01-27 DIAGNOSIS — M545 Low back pain: Secondary | ICD-10-CM | POA: Diagnosis not present

## 2016-02-02 ENCOUNTER — Ambulatory Visit: Payer: Federal, State, Local not specified - PPO | Admitting: Diagnostic Neuroimaging

## 2016-02-03 DIAGNOSIS — G8929 Other chronic pain: Secondary | ICD-10-CM | POA: Diagnosis not present

## 2016-02-03 DIAGNOSIS — M545 Low back pain: Secondary | ICD-10-CM | POA: Diagnosis not present

## 2016-02-09 DIAGNOSIS — M545 Low back pain: Secondary | ICD-10-CM | POA: Diagnosis not present

## 2016-02-09 DIAGNOSIS — G8929 Other chronic pain: Secondary | ICD-10-CM | POA: Diagnosis not present

## 2016-02-11 DIAGNOSIS — G8929 Other chronic pain: Secondary | ICD-10-CM | POA: Diagnosis not present

## 2016-02-11 DIAGNOSIS — M545 Low back pain: Secondary | ICD-10-CM | POA: Diagnosis not present

## 2016-02-15 DIAGNOSIS — M545 Low back pain: Secondary | ICD-10-CM | POA: Diagnosis not present

## 2016-02-15 DIAGNOSIS — G8929 Other chronic pain: Secondary | ICD-10-CM | POA: Diagnosis not present

## 2016-02-17 DIAGNOSIS — M545 Low back pain: Secondary | ICD-10-CM | POA: Diagnosis not present

## 2016-02-17 DIAGNOSIS — G8929 Other chronic pain: Secondary | ICD-10-CM | POA: Diagnosis not present

## 2016-02-21 DIAGNOSIS — M545 Low back pain: Secondary | ICD-10-CM | POA: Diagnosis not present

## 2016-02-21 DIAGNOSIS — G8929 Other chronic pain: Secondary | ICD-10-CM | POA: Diagnosis not present

## 2016-03-17 ENCOUNTER — Other Ambulatory Visit: Payer: Self-pay | Admitting: Family Medicine

## 2016-04-08 ENCOUNTER — Other Ambulatory Visit: Payer: Self-pay | Admitting: Family Medicine

## 2016-04-13 ENCOUNTER — Other Ambulatory Visit: Payer: Self-pay | Admitting: Family Medicine

## 2016-05-05 ENCOUNTER — Other Ambulatory Visit: Payer: Self-pay | Admitting: Family Medicine

## 2016-05-09 ENCOUNTER — Ambulatory Visit (INDEPENDENT_AMBULATORY_CARE_PROVIDER_SITE_OTHER): Payer: Federal, State, Local not specified - PPO | Admitting: Family Medicine

## 2016-05-09 DIAGNOSIS — R1013 Epigastric pain: Secondary | ICD-10-CM | POA: Insufficient documentation

## 2016-05-09 LAB — COMPREHENSIVE METABOLIC PANEL
ALT: 14 U/L (ref 0–35)
AST: 10 U/L (ref 0–37)
Albumin: 3.8 g/dL (ref 3.5–5.2)
Alkaline Phosphatase: 60 U/L (ref 39–117)
BUN: 9 mg/dL (ref 6–23)
CHLORIDE: 109 meq/L (ref 96–112)
CO2: 22 mEq/L (ref 19–32)
Calcium: 9.3 mg/dL (ref 8.4–10.5)
Creatinine, Ser: 0.63 mg/dL (ref 0.40–1.20)
GFR: 126.64 mL/min (ref >60.00)
GLUCOSE: 94 mg/dL (ref 70–99)
POTASSIUM: 3.6 meq/L (ref 3.5–5.1)
SODIUM: 138 meq/L (ref 135–145)
Total Bilirubin: 0.2 mg/dL (ref 0.2–1.2)
Total Protein: 7 g/dL (ref 6.0–8.3)

## 2016-05-09 LAB — CBC WITH DIFFERENTIAL/PLATELET
Basophils Absolute: 0 10*3/uL (ref 0.0–0.1)
Basophils Relative: 0.4 % (ref 0.0–3.0)
EOS PCT: 2.5 % (ref 0.0–5.0)
Eosinophils Absolute: 0.2 10*3/uL (ref 0.0–0.7)
HCT: 38 % (ref 36.0–46.0)
Hemoglobin: 12.4 g/dL (ref 12.0–15.0)
LYMPHS ABS: 4 10*3/uL (ref 0.7–4.0)
Lymphocytes Relative: 45.4 % (ref 12.0–46.0)
MCHC: 32.7 g/dL (ref 30.0–36.0)
MCV: 83.3 fl (ref 78.0–100.0)
MONOS PCT: 6.6 % (ref 3.0–12.0)
Monocytes Absolute: 0.6 10*3/uL (ref 0.1–1.0)
NEUTROS ABS: 4 10*3/uL (ref 1.4–7.7)
NEUTROS PCT: 45.1 % (ref 43.0–77.0)
PLATELETS: 370 10*3/uL (ref 150.0–400.0)
RBC: 4.56 Mil/uL (ref 3.87–5.11)
RDW: 14.3 % (ref 11.5–15.5)
WBC: 8.8 10*3/uL (ref 4.0–10.5)

## 2016-05-09 LAB — LIPASE: Lipase: 29 U/L (ref 11.0–59.0)

## 2016-05-09 LAB — H. PYLORI ANTIBODY, IGG: H PYLORI IGG: NEGATIVE

## 2016-05-09 MED ORDER — ESOMEPRAZOLE MAGNESIUM 40 MG PO CPDR: 40.0000 mg | DELAYED_RELEASE_CAPSULE | Freq: Every day | ORAL | 3 refills | Status: DC

## 2016-05-09 NOTE — Assessment & Plan Note (Signed)
New- ? GERD vs esophageal spasms. Will check labs today- including H pylori and lipase. Start on Nexium nightly x 2 weeks- samples given. The patient indicates understanding of these issues and agrees with the plan. May need endoscopy if symptoms persist. Orders Placed This Encounter  Procedures  . H. pylori antibody, IgG  . Comprehensive metabolic panel  . Lipase  . CBC With Differential

## 2016-05-09 NOTE — Progress Notes (Signed)
Subjective:   Patient ID: Kylie Ware, female    DOB: 1995-11-13, 21 y.o.   MRN: [id number]  Kylie Ware is a pleasant 21 y.o. year old female who presents to clinic today with Acute Visit (episgastric pain)  on 05/09/2016  HPI:  For one month- having epigastric pressure- especially when she lays down at night. Drinking warm liquids is the only thing she has tried for it- it does help at times.  Never wakes her up from sleep.  Denies nausea, vomiting or feeling acid in her throat.  She is not sure if it is worsened by certain foods.  She does feel like it is worsened with stress.  She does not drink ETOH or smoke.  No difficulty swallowing. Current Outpatient Prescriptions on File Prior to Visit  Medication Sig Dispense Refill  . cyclobenzaprine (FLEXERIL) 5 MG tablet Take 1 tablet (5 mg total) by mouth 3 (three) times daily as needed for muscle spasms. 30 tablet 1  . JUNEL 1/20 1-20 MG-MCG tablet TAKE 1 TABLET BY MOUTH DAILY. COMPLETE PHYSICAL EXAM REQUIRED FOR ADDITIONAL REFILLS 21 tablet 0  . naproxen (NAPROSYN) 500 MG tablet 500 mg.  2  . rizatriptan (MAXALT-MLT) 10 MG disintegrating tablet Take 1 tablet (10 mg total) by mouth as needed for migraine. May repeat in 2 hours if needed 9 tablet 11  . sertraline (ZOLOFT) 25 MG tablet TAKE 1 TABLET BY MOUTH DAILY 90 tablet 0  . topiramate (TOPAMAX) 50 MG tablet Take 1 tablet (50 mg total) by mouth 2 (two) times daily. 180 tablet 4   No current facility-administered medications on file prior to visit.     No Known Allergies  Past Medical History:  Diagnosis Date  . Anxiety   . Depression   . Migraines     No past surgical history on file.  Family History  Problem Relation Age of Onset  . Diabetes Father   . Cancer Mother     breast  . Cancer Maternal Grandmother 44    breast CA    Social History   Social History  . Marital status: Single    Spouse name: N/A  . Number of children: N/A  . Years of  education: N/A   Occupational History  . Not on file.   Social History Main Topics  . Smoking status: Never Smoker  . Smokeless tobacco: Never Used  . Alcohol use No  . Drug use: No  . Sexual activity: Not on file   Other Topics Concern  . Not on file   Social History Narrative   Attending App   Sexually active   Non smoking.      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   The PMH, PSH, Social History, Family History, Medications, and allergies have been reviewed in [facility], and have been updated if relevant.   Review of Systems  Constitutional: Negative.   HENT: Negative.   Gastrointestinal: Positive for abdominal pain. Negative for abdominal distention, anal bleeding, blood in stool, constipation, diarrhea, nausea, rectal pain and vomiting.  Skin: Negative.   Neurological: Negative.   Hematological: Negative.   Psychiatric/Behavioral: Negative.   All other systems reviewed and are negative.      Objective:    BP 116/80   Pulse 80   Wt 275 lb (124.7 kg)   SpO2 97%   BMI 43.07 kg/m    Physical Exam  Constitutional: She is oriented to person, place, and time. She appears well-developed and well-nourished. No distress.  HENT:  Head: Normocephalic and atraumatic.  Eyes: Conjunctivae are normal.  Cardiovascular: Normal rate.   Pulmonary/Chest: Effort normal.  Abdominal: Soft. Bowel sounds are normal. She exhibits no distension and no mass. There is no tenderness. There is no rebound and no guarding.  Musculoskeletal: Normal range of motion. She exhibits no edema.  Neurological: She is alert and oriented to person, place, and time. No cranial nerve deficit.  Skin: Skin is warm and dry. She is not diaphoretic.  Psychiatric: She has a normal mood and affect. Her behavior is normal. Judgment and thought content normal.  Nursing note and vitals reviewed.         Assessment & Plan:   Epigastric pain - Plan: H. pylori antibody, IgG, Comprehensive metabolic panel, Lipase,  CBC With Differential No Follow-up on file.

## 2016-05-09 NOTE — Addendum Note (Signed)
Addended by: Alvina Chou on: 05/09/2016 12:13 PM   Modules accepted: Orders

## 2016-05-09 NOTE — Patient Instructions (Signed)
Good to see you.I will call you with your lab results and you can see them online.  Please take nexium every evening before bed for 2 weeks.

## 2016-06-18 ENCOUNTER — Other Ambulatory Visit: Payer: Self-pay | Admitting: Family Medicine

## 2016-06-28 ENCOUNTER — Encounter: Payer: Self-pay | Admitting: Family Medicine

## 2016-06-28 ENCOUNTER — Ambulatory Visit (INDEPENDENT_AMBULATORY_CARE_PROVIDER_SITE_OTHER): Payer: Federal, State, Local not specified - PPO | Admitting: Family Medicine

## 2016-06-28 VITALS — BP 110/86 | HR 68 | Ht 67.0 in | Wt 272.0 lb

## 2016-06-28 DIAGNOSIS — F32A Depression, unspecified: Secondary | ICD-10-CM

## 2016-06-28 DIAGNOSIS — F419 Anxiety disorder, unspecified: Secondary | ICD-10-CM | POA: Diagnosis not present

## 2016-06-28 DIAGNOSIS — F329 Major depressive disorder, single episode, unspecified: Secondary | ICD-10-CM

## 2016-06-28 DIAGNOSIS — Z0000 Encounter for general adult medical examination without abnormal findings: Secondary | ICD-10-CM

## 2016-06-28 DIAGNOSIS — Z01419 Encounter for gynecological examination (general) (routine) without abnormal findings: Secondary | ICD-10-CM

## 2016-06-28 MED ORDER — NORETHINDRONE ACET-ETHINYL EST 1-20 MG-MCG PO TABS: 1.0000 | ORAL_TABLET | Freq: Every day | ORAL | 11 refills | Status: DC

## 2016-06-28 NOTE — Assessment & Plan Note (Signed)
-   Well controlled on zoloft

## 2016-06-28 NOTE — Assessment & Plan Note (Signed)
Reviewed preventive care protocols, scheduled due services, and updated immunizations Discussed nutrition, exercise, diet, and healthy lifestyle.  Declines Pap today.

## 2016-06-28 NOTE — Progress Notes (Signed)
Subjective:    Patient ID: Kylie KhanElizabeth Ware, female    DOB: 1995/10/20, 21 y.o.   MRN: [id number]  HPI  Very pleasant 21 yo female here for CPX and follow up of chronic medical conditions.   She is taking Junel OCPs which have helped with her menorrhagia and cramping. Sexually active with her boyfriend.  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  Obesity- has been an issue her entire life.  Admits to snacking too much and making poor food choices.  Does not like to exercise. Likes playing video games.  Migraines- followed by neurology. Last saw Dr. Marjory LiesPenumalli on 01/26/16.  Note reviewed.  Advised to continue Topamax 50 mg twice daily for migraine prevention.  Trial of rizatriptan as needed Migraines are stress induced.  Improved.  Zoloft- symptoms are well controlled.  Denies any anxiety or depression.   Wt Readings from Last 3 Encounters:  06/28/16 272 lb (123.4 kg)  05/09/16 275 lb (124.7 kg)  01/26/16 266 lb 12.8 oz (121 kg)     Lab Results  Component Value Date   CHOL 212 (H) 03/29/2015   HDL 76.90 03/29/2015   LDLCALC 116 (H) 03/29/2015   TRIG 98.0 03/29/2015   CHOLHDL 3 03/29/2015   Lab Results  Component Value Date   CREATININE 0.63 05/09/2016   Lab Results  Component Value Date   NA 138 05/09/2016   K 3.6 05/09/2016   CL 109 05/09/2016   CO2 22 05/09/2016   Lab Results  Component Value Date   HGBA1C 5.4 08/02/2012    Patient Active Problem List   Diagnosis Date Noted  . Epigastric pain 05/09/2016  . Anxiety and depression 01/20/2014  . Encounter for other general counseling or advice on contraception 01/20/2014   Past Medical History:  Diagnosis Date  . Anxiety   . Depression   . Migraines    No past surgical history on file. Social History  Substance Use Topics  . Smoking status: Never Smoker  . Smokeless tobacco: Never Used  . Alcohol use No   Family History  Problem Relation Age of Onset  . Diabetes Father   .  Cancer Mother        breast  . Cancer Maternal Grandmother [phone]       breast CA   No Known Allergies Current Outpatient Prescriptions on File Prior to Visit  Medication Sig Dispense Refill  . cyclobenzaprine (FLEXERIL) 5 MG tablet Take 1 tablet (5 mg total) by mouth 3 (three) times daily as needed for muscle spasms. 30 tablet 1  . esomeprazole (NEXIUM) 40 MG capsule Take 1 capsule (40 mg total) by mouth daily. 30 capsule 3  . JUNEL 1/20 1-20 MG-MCG tablet TAKE 1 TABLET BY MOUTH DAILY. COMPLETE PHYSICAL EXAM REQUIRED FOR ADDITIONAL REFILLS 21 tablet 0  . naproxen (NAPROSYN) 500 MG tablet 500 mg.  2  . rizatriptan (MAXALT-MLT) 10 MG disintegrating tablet Take 1 tablet (10 mg total) by mouth as needed for migraine. May repeat in 2 hours if needed 9 tablet 11  . sertraline (ZOLOFT) 25 MG tablet TAKE 1 TABLET BY MOUTH DAILY 90 tablet 0  . topiramate (TOPAMAX) 50 MG tablet Take 1 tablet (50 mg total) by mouth 2 (two) times daily. 180 tablet 4   No current facility-administered medications on file prior to visit.    The PMH, PSH, Social History, Family History, Medications, and allergies [facility], and have been updated if relevant.  Review of Systems  Constitutional: Negative.   HENT: Negative.   Respiratory: Negative.   Cardiovascular: Negative.   Gastrointestinal: Negative.   Endocrine: Negative.   Genitourinary: Negative.   Musculoskeletal: Negative.   Allergic/Immunologic: Negative.   Neurological: Negative.   Hematological: Negative.   Psychiatric/Behavioral: Negative.   All other systems reviewed and are negative.      Objective:   Physical Exam BP 110/86   Pulse 68   Ht 5\' 7"  (1.702 m)   Wt 272 lb (123.4 kg)   SpO2 98%   BMI 42.60 kg/m   General:  Obese, Well-developed,well-nourished,in no acute distress; alert,appropriate and cooperative throughout examination Head:  normocephalic and atraumatic.   Eyes:  vision grossly intact, pupils equal, pupils  round, and pupils reactive to light.   Ears:  R ear normal and L ear normal.   Nose:  no external deformity.   Mouth:  good dentition.   Lungs:  Normal respiratory effort, chest expands symmetrically. Lungs are clear to auscultation, no crackles or wheezes. Heart:  Normal rate and regular rhythm. S1 and S2 normal without gallop, murmur, click, rub or other extra sounds. Abdomen:  Bowel sounds positive,abdomen soft and non-tender without masses, organomegaly or hernias noted. Msk:  No deformity or scoliosis noted of thoracic or lumbar spine.   Extremities:  No clubbing, cyanosis, edema, or deformity noted with normal full range of motion of all joints.   Neurologic:  alert & oriented X3 and gait normal.   Skin:  Intact without suspicious lesions or rashes Cervical Nodes:  No lymphadenopathy noted Axillary Nodes:  No palpable lymphadenopathy Psych:  Cognition and judgment appear intact. Alert and cooperative with normal attention span and concentration. No apparent delusions, illusions, hallucinations    Assessment & Plan:

## 2016-06-28 NOTE — Patient Instructions (Signed)
Great to see you!  Please make an appointment for a pap smear this year.

## 2016-07-13 ENCOUNTER — Other Ambulatory Visit: Payer: Self-pay | Admitting: Family Medicine

## 2016-07-13 NOTE — Telephone Encounter (Signed)
Last refill 04/13/16 #90, last OV 06/28/16

## 2016-07-26 ENCOUNTER — Ambulatory Visit: Payer: Federal, State, Local not specified - PPO | Admitting: Diagnostic Neuroimaging

## 2016-08-29 ENCOUNTER — Encounter: Payer: Self-pay | Admitting: Diagnostic Neuroimaging

## 2016-08-29 ENCOUNTER — Ambulatory Visit (INDEPENDENT_AMBULATORY_CARE_PROVIDER_SITE_OTHER): Payer: Federal, State, Local not specified - PPO | Admitting: Diagnostic Neuroimaging

## 2016-08-29 VITALS — BP 126/82 | HR 84 | Ht 67.0 in | Wt 277.2 lb

## 2016-08-29 DIAGNOSIS — G43009 Migraine without aura, not intractable, without status migrainosus: Secondary | ICD-10-CM | POA: Diagnosis not present

## 2016-08-29 MED ORDER — DICLOFENAC POTASSIUM(MIGRAINE) 50 MG PO PACK: 50.0000 mg | PACK | ORAL | 3 refills | Status: DC | PRN

## 2016-08-29 MED ORDER — PROPRANOLOL HCL 20 MG PO TABS: 20.0000 mg | ORAL_TABLET | Freq: Two times a day (BID) | ORAL | 6 refills | Status: DC

## 2016-08-29 NOTE — Patient Instructions (Signed)
MIGRAINE PREVENTION - stop topiramate  - start propranolol 20mg  twice a day - consider aimovig [place] injection in future  MIGRAINE RESCUE - start diclofenac 50mg  as needed for migraine - continue rizatriptan as needed

## 2016-08-29 NOTE — Progress Notes (Signed)
[facility]  PATIENT: Kylie Ware DOB: Jun 23, 1995  REFERRING CLINICIAN: Ruthe MannanAron, Talia HISTORY FROM: patient REASON FOR VISIT: follow up    HISTORICAL  CHIEF COMPLAINT:  Chief Complaint  Patient presents with  . Follow-up  . Migraine    Pt continues with migraines (last week 2).  Had one yesterday level 6-7, took medication maxalt and did not help.  Today Level 2.  Is taking topiramate 25mg  po daily only, due to she was having memory issues with larger dose.  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     HISTORY OF PRESENT ILLNESS:   UPDATE 08/29/16: Since last visit, was doing well with HA, but then noted more memory issues, and therefore decr her TPX to 25mg  at bedtime. Now HA are 2x / week.   UPDATE 01/26/16: Since last visit, now with more HA (now every other day), related to school stress. Overall tolerating TPX. Using ibuprofen or naproxen for migraine rescue, but not as effective anymore.   UPDATE 07/26/15: Since last visit, doing well. Avg 1 HA per month. During classes and finals, HA were worse. Now doing better. Tolerating TPX.  UPDATE 01/25/15: Since last visit, doing well. Occ has HA (1 every 2 weeks) but milder. Overall stable. Lack of sleep is still a trigger for HA. Currently home for winter break.  UPDATE 08/25/14: Since last visit, HA resolved, since increasing TPX to 50mg  BID. Some mild word finding diff. Overall doing well.   PRIOR HPI (07/13/14): 21 year old right-handed female here for evaluation of headaches. March 2016 patient had onset of daily headaches, bitemporal, retro-orbital, pressure and throbbing sensation with blurred vision, dizziness, balance difficulties. Her vision difficulties are described as if she is looking to the car windshield with rain, without using the windshield wipers. She reports some photophobia, phonophobia, nausea. No vomiting. Sometimes she sees "clear sparkles" during the headaches. Headaches may last all day or  several hours at a time. She's had some headache at least on a daily basis since March. Patient had one episode of headache in ninth grade, with blurred vision and emergency room evaluation. She is not sure if she was ever diagnosed with migraine headache. She has family history of migraine in her father, who takes topiramate. January 2016 patient had onset of sleep disturbance, insomnia, related to change in her roommate sleeping pattern. Patient was then used to having a roommate, and when she was having to sleep alone, she had more difficulty. Typically she lays down around midnight, falls asleep around 2 AM, wakes up 2-3 times in the night, and then wakes up at 11:00 in the morning or 1:00 in the afternoon. Patient also reports onset of depression and anxiety symptoms in August 2015. She is on medication and seeing a psychiatrist. No other changes in caffeine, stress, diet or exercise.   REVIEW OF SYSTEMS: Full 14 system review of systems performed and negative except: loss of vision (with HA) memory loss dizziness speech diff depression anxiety.   ALLERGIES: No Known Allergies  HOME MEDICATIONS: Outpatient Medications Prior to Visit  Medication Sig Dispense Refill  . cyclobenzaprine (FLEXERIL) 5 MG tablet Take 1 tablet (5 mg total) by mouth 3 (three) times daily as needed for muscle spasms. 30 tablet 1  . naproxen (NAPROSYN) 500 MG tablet 500 mg.  2  . norethindrone-ethinyl estradiol (JUNEL 1/20) 1-20 MG-MCG tablet Take 1 tablet by mouth daily. 21 tablet 11  . rizatriptan (MAXALT-MLT) 10 MG disintegrating tablet Take 1 tablet (10 mg total)  by mouth as needed for migraine. May repeat in 2 hours if needed 9 tablet 11  . sertraline (ZOLOFT) 25 MG tablet TAKE 1 TABLET BY MOUTH DAILY 90 tablet 0  . topiramate (TOPAMAX) 50 MG tablet Take 1 tablet (50 mg total) by mouth 2 (two) times daily. 180 tablet 4  . esomeprazole (NEXIUM) 40 MG capsule Take 1 capsule (40 mg total) by mouth daily. (Patient not  taking: Reported on 08/29/2016) 30 capsule 3   No facility-administered medications prior to visit.     PAST MEDICAL HISTORY: Past Medical History:  Diagnosis Date  . Anxiety   . Depression   . Migraines     PAST SURGICAL HISTORY: No past surgical history on file.  FAMILY HISTORY: Family History  Problem Relation Age of Onset  . Diabetes Father   . Cancer Mother        breast  . Cancer Maternal Grandmother 35       breast CA    SOCIAL HISTORY:  Social History   Social History  . Marital status: Single    Spouse name: N/A  . Number of children: N/A  . Years of education: N/A   Occupational History  . Not on file.   Social History Main Topics  . Smoking status: Never Smoker  . Smokeless tobacco: Never Used  . Alcohol use No  . Drug use: No  . Sexual activity: Not on file   Other Topics Concern  . Not on file   Social History Narrative   Attending App   Sexually active   Non smoking.      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     PHYSICAL EXAM  Vitals:   08/29/16 1450  BP: 126/82  Pulse: 84  Weight: 277 lb 3.2 oz (125.7 kg)  Height: 5\' 7"  (1.702 m)   Wt Readings from Last 3 Encounters:  08/29/16 277 lb 3.2 oz (125.7 kg)  06/28/16 272 lb (123.4 kg)  05/09/16 275 lb (124.7 kg)   Body mass index is 43.42 kg/m.  GENERAL EXAM: Patient is in no distress; well developed, nourished and groomed; neck is supple; SOFT SPOKEN, CALM.  CARDIOVASCULAR: Regular rate and rhythm, no murmurs, no carotid bruits  NEUROLOGIC: MENTAL STATUS: awake, alert, language fluent, comprehension intact, naming intact, fund of knowledge appropriate CRANIAL NERVE: pupils equal and reactive to light, visual fields full to confrontation, extraocular muscles intact, no nystagmus, facial sensation and strength symmetric, hearing intact, palate elevates symmetrically, uvula midline, shoulder shrug symmetric, tongue midline. MOTOR: normal bulk and tone, full strength in the BUE,  BLE SENSORY: normal and symmetric to light touch COORDINATION: finger-nose-finger, fine finger movements normal REFLEXES: deep tendon reflexes present and symmetric GAIT/STATION: narrow based gait    DIAGNOSTIC DATA (LABS, IMAGING, TESTING) - I reviewed patient records, labs, notes, testing and imaging myself where available.  Lab Results  Component Value Date   WBC 8.8 05/09/2016   HGB 12.4 05/09/2016   HCT 38.0 05/09/2016   MCV 83.3 05/09/2016   PLT 370.0 05/09/2016      Component Value Date/Time   NA 138 05/09/2016 0944   K 3.6 05/09/2016 0944   CL 109 05/09/2016 0944   CO2 22 05/09/2016 0944   GLUCOSE 94 05/09/2016 0944   BUN 9 05/09/2016 0944   CREATININE 0.63 05/09/2016 0944   CREATININE 0.60 08/02/2012 1440   CALCIUM 9.3 05/09/2016 0944   PROT 7.0 05/09/2016 0944   ALBUMIN 3.8 05/09/2016 0944   AST 10  05/09/2016 0944   ALT 14 05/09/2016 0944   ALKPHOS 60 05/09/2016 0944   BILITOT 0.2 05/09/2016 0944   Lab Results  Component Value Date   CHOL 212 (H) 03/29/2015   HDL 76.90 03/29/2015   LDLCALC 116 (H) 03/29/2015   TRIG 98.0 03/29/2015   CHOLHDL 3 03/29/2015   Lab Results  Component Value Date   HGBA1C 5.4 08/02/2012   No results found for: VITAMINB12 Lab Results  Component Value Date   TSH 3.84 03/29/2015    11/26/09 CT head - normal [I reviewed images myself and agree with interpretation. -[facility]P]   08/25/14 Essentially normal MRI brain (with and without) demonstrating: 1. Minimal periventricular capping/gliosis and a single right frontal punctate focus of non-specific gliosis. These findings are non-specific and may be within normal limits.  2. No abnormal enhancing lesions. No acute findings.     ASSESSMENT AND PLAN  21 y.o. year old female here with new onset headaches since March 2016, with migraine features. Was doing well on TPX 50mg  twice a day in the past, but now more side effects.   Meds tried and failed: topiramate, rizatriptan,  ibuprofen   Dx:   Migraine without aura and without status migrainosus, not intractable    PLAN:  I spent 25 minutes of face to face time with patient. Greater than 50% of time was spent in counseling and coordination of care with patient. In summary we discussed:   MIGRAINE PREVENTION - stop topiramate  - start propranolol 20mg  twice a day - consider aimovig [place] injection in future  MIGRAINE RESCUE - start diclofenac 50mg  as needed for migraine - continue rizatriptan as needed - medicine benefits and side effects reviewed  RIGHT LUMBAR RADICULOPATHY - continue PT for lumbar radiculopathy (to the right leg); also reviewed yoga stretching, nutrition and other techniques to alleviate pain  Meds ordered this encounter  Medications  . propranolol (INDERAL) 20 MG tablet    Sig: Take 1 tablet (20 mg total) by mouth 2 (two) times daily.    Dispense:  60 tablet    Refill:  6  . Diclofenac Potassium (CAMBIA) 50 MG PACK    Sig: Take 50 mg by mouth as needed (for migraine; max 1 packet per day).    Dispense:  9 each    Refill:  3   Return in about 6 months (around 03/01/2017).    Suanne Marker, MD 08/29/2016, 3:45 PM Certified in Neurology, Neurophysiology and Neuroimaging  [facility] [address] [place], [place] [address] [phone]

## 2016-08-30 ENCOUNTER — Telehealth: Payer: Self-pay | Admitting: *Deleted

## 2016-08-30 MED ORDER — DICLOFENAC POTASSIUM 50 MG PO TABS: 50.0000 mg | ORAL_TABLET | Freq: Three times a day (TID) | ORAL | 3 refills | Status: DC

## 2016-08-30 NOTE — Telephone Encounter (Signed)
[facility] [place] [place] [facility] camb[place].  Too expensive  $305. 45.  Please advise.

## 2016-08-30 NOTE — Telephone Encounter (Signed)
Spoke to pt and let her know that sent in same medication but as a tablet.  Explained that can use when has migraine coming on (may take 1 tablet tid).  Gave only 30 tablets with 3 refills.  She verbalized understanding.

## 2016-10-14 DIAGNOSIS — J029 Acute pharyngitis, unspecified: Secondary | ICD-10-CM | POA: Diagnosis not present

## 2016-10-15 ENCOUNTER — Other Ambulatory Visit: Payer: Self-pay | Admitting: Family Medicine

## 2016-11-19 DIAGNOSIS — R197 Diarrhea, unspecified: Secondary | ICD-10-CM | POA: Diagnosis not present

## 2017-02-07 DIAGNOSIS — K080 Exfoliation of teeth due to systemic causes: Secondary | ICD-10-CM | POA: Diagnosis not present

## 2017-02-08 ENCOUNTER — Other Ambulatory Visit (HOSPITAL_COMMUNITY)
Admission: RE | Admit: 2017-02-08 | Discharge: 2017-02-08 | Disposition: A | Discharge status: Home / Self Care | Payer: Federal, State, Local not specified - PPO | Source: Ambulatory Visit | Attending: Family Medicine | Admitting: Family Medicine

## 2017-02-08 ENCOUNTER — Encounter: Payer: Self-pay | Admitting: Family Medicine

## 2017-02-08 ENCOUNTER — Ambulatory Visit: Payer: Federal, State, Local not specified - PPO | Admitting: Family Medicine

## 2017-02-08 VITALS — BP 132/86 | HR 100 | Temp 98.4°F | Ht 66.75 in | Wt 287.4 lb

## 2017-02-08 DIAGNOSIS — Z01419 Encounter for gynecological examination (general) (routine) without abnormal findings: Secondary | ICD-10-CM

## 2017-02-08 DIAGNOSIS — Z23 Encounter for immunization: Secondary | ICD-10-CM

## 2017-02-08 NOTE — Progress Notes (Signed)
Subjective:   Patient ID: Kylie Ware, female    DOB: [birth year], 22 y.o.   MRN: [id number]  Kylie Ware is a pleasant 22 y.o. year old female who presents to clinic today with Gynecologic Exam (Patient is here today for a PAP.  She had a CPE on 5.23.2018 without a PAP.  She would also like to get the Tdap vaccine.)  on 02/08/2017  HPI:  She is taking Junel OCPs which have helped with her menorrhagia and cramping. Sexually active with her boyfriend.  Has never had a pap smear.  Current Outpatient Medications on File Prior to Visit  Medication Sig Dispense Refill  . cyclobenzaprine (FLEXERIL) 5 MG tablet Take 1 tablet (5 mg total) by mouth 3 (three) times daily as needed for muscle spasms. 30 tablet 1  . diclofenac (CATAFLAM) 50 MG tablet Take 1 tablet (50 mg total) by mouth 3 (three) times daily. 30 tablet 3  . naproxen (NAPROSYN) 500 MG tablet 500 mg.  2  . norethindrone-ethinyl estradiol (JUNEL 1/20) 1-20 MG-MCG tablet Take 1 tablet by mouth daily. 21 tablet 11  . propranolol (INDERAL) 20 MG tablet Take 1 tablet (20 mg total) by mouth 2 (two) times daily. 60 tablet 6  . rizatriptan (MAXALT-MLT) 10 MG disintegrating tablet Take 1 tablet (10 mg total) by mouth as needed for migraine. May repeat in 2 hours if needed 9 tablet 11  . sertraline (ZOLOFT) 25 MG tablet TAKE 1 TABLET BY MOUTH DAILY 90 tablet 1   No current facility-administered medications on file prior to visit.     No Known Allergies  Past Medical History:  Diagnosis Date  . Anxiety   . Depression   . Migraines     No past surgical history on file.  Family History  Problem Relation Age of Onset  . Diabetes Father   . Cancer Mother        breast  . Cancer Maternal Grandmother 48       breast CA    Social History   Socioeconomic History  . Marital status: Single    Spouse name: Not on file  . Number of children: Not on file  . Years of education: Not on file  . Highest education level: Not on  file  Social Needs  . Financial resource strain: Not on file  . Food insecurity - worry: Not on file  . Food insecurity - inability: Not on file  . Transportation needs - medical: Not on file  . Transportation needs - non-medical: Not on file  Occupational History  . Not on file  Tobacco Use  . Smoking status: Never Smoker  . Smokeless tobacco: Never Used  Substance and Sexual Activity  . Alcohol use: No    Alcohol/week: 0.0 oz  . Drug use: No  . Sexual activity: Not on file  Other Topics Concern  . Not on file  Social History Narrative   Attending App   Sexually active   Non smoking.      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   The PMH, PSH, Social History, Family History, Medications, and allergies have been reviewed in [facility], and have been updated if relevant.  Review of Systems  Genitourinary: Negative.  Negative for decreased urine volume, difficulty urinating, dyspareunia, dysuria, enuresis, flank pain, frequency, genital sores, hematuria, menstrual problem, pelvic pain, urgency, vaginal bleeding, vaginal discharge and vaginal pain.  All other systems reviewed and are negative.      Objective:  BP 132/86 (BP Location: Left Arm, Patient Position: Sitting, Cuff Size: Large)   Pulse 100   Temp 98.4 F (36.9 C) (Oral)   Ht 5' 6.75" (1.695 m)   Wt 287 lb 6.4 oz (130.4 kg)   LMP 11/19/2016   SpO2 97%   BMI 45.35 kg/m    Physical Exam   General:  Well-developed,well-nourished,in no acute distress; alert,appropriate and cooperative throughout examination Head:  normocephalic and atraumatic.   Nose:  no external deformity.   Mouth:  good dentition.   Neck:  No deformities, masses, or tenderness noted. Breasts:  No mass, nodules, thickening, tenderness, bulging, retraction, inflamation, nipple discharge or skin changes noted.   Abdomen:  Bowel sounds positive,abdomen soft and non-tender without masses, organomegaly or hernias noted. Rectal:  no external abnormalities.    Genitalia:  Pelvic Exam:        External: normal female genitalia without lesions or masses        Vagina: normal without lesions or masses        Cervix: normal without lesions or masses        Adnexa: normal bimanual exam without masses or fullness        Uterus: normal by palpation        Pap smear: performed Extremities:  No clubbing, cyanosis, edema, or deformity noted with normal full range of motion of all joints.   Neurologic:  alert & oriented X3 and gait normal.   Skin:  Intact without suspicious lesions or rashes Psych:  Cognition and judgment appear intact. Alert and cooperative with normal attention span and concentration. No apparent delusions, illusions, hallucinations       Assessment & Plan:   Need for Tdap vaccination - Plan: Tdap vaccine greater than or equal to 7yo IM  Encounter for gynecological examination without abnormal finding - Plan: Cytology - PAP No Follow-up on file.

## 2017-02-08 NOTE — Patient Instructions (Signed)
Great to see you. Happy New Year and have a Happy Birthday!

## 2017-02-09 DIAGNOSIS — M5126 Other intervertebral disc displacement, lumbar region: Secondary | ICD-10-CM | POA: Diagnosis not present

## 2017-02-09 DIAGNOSIS — M545 Low back pain: Secondary | ICD-10-CM | POA: Diagnosis not present

## 2017-02-09 LAB — CYTOLOGY - PAP
ADEQUACY: ABSENT
Bacterial vaginitis: NEGATIVE
CANDIDA VAGINITIS: NEGATIVE
CHLAMYDIA, DNA PROBE: NEGATIVE
DIAGNOSIS: NEGATIVE
HPV: NOT DETECTED
Neisseria Gonorrhea: NEGATIVE
Trichomonas: NEGATIVE

## 2017-02-12 LAB — CERVICOVAGINAL ANCILLARY ONLY: Herpes: NEGATIVE

## 2017-02-13 ENCOUNTER — Ambulatory Visit: Payer: Federal, State, Local not specified - PPO | Admitting: Diagnostic Neuroimaging

## 2017-02-13 ENCOUNTER — Encounter: Payer: Self-pay | Admitting: Diagnostic Neuroimaging

## 2017-02-13 VITALS — BP 146/94 | HR 89 | Ht 66.75 in | Wt 287.8 lb

## 2017-02-13 DIAGNOSIS — G43009 Migraine without aura, not intractable, without status migrainosus: Secondary | ICD-10-CM | POA: Diagnosis not present

## 2017-02-13 DIAGNOSIS — M545 Low back pain: Secondary | ICD-10-CM | POA: Diagnosis not present

## 2017-02-13 MED ORDER — PROPRANOLOL HCL 20 MG PO TABS: 20.0000 mg | ORAL_TABLET | Freq: Two times a day (BID) | ORAL | 4 refills | Status: DC

## 2017-02-13 MED ORDER — DICLOFENAC POTASSIUM 50 MG PO TABS: 50.0000 mg | ORAL_TABLET | Freq: Every day | ORAL | 3 refills | Status: DC | PRN

## 2017-02-13 NOTE — Progress Notes (Signed)
[facility]  PATIENT: Kylie Ware DOB: 1995-10-21  REFERRING CLINICIAN: Ruthe MannanAron, Talia HISTORY FROM: patient REASON FOR VISIT: follow up    HISTORICAL  CHIEF COMPLAINT:  Chief Complaint  Patient presents with  . Follow-up  . Migraine    Stopped propranolol for while since not sleeping well due to stressors, is back on now. has migraine level 3 now.  Diclofenac works better then PACCAR Incmaxalt.      HISTORY OF PRESENT ILLNESS:   UPDATE (02/13/17, VRP): Since last visit, was doing well with addition of propranolol 20mg  twice a day and HA resolved, until exams during December 2018. Also forgot to take propranolol for 2 weeks during stressful time and HA worsened. Now back on propranolol and doing well. Tolerating diclofenac (effective). Aggravating factors are stress and lack of sleep. Has mild HA today.   UPDATE 08/29/16: Since last visit, was doing well with HA, but then noted more memory issues, and therefore decr her TPX to 25mg  at bedtime. Now HA are 2x / week.   UPDATE 01/26/16: Since last visit, now with more HA (now every other day), related to school stress. Overall tolerating TPX. Using ibuprofen or naproxen for migraine rescue, but not as effective anymore.   UPDATE 07/26/15: Since last visit, doing well. Avg 1 HA per month. During classes and finals, HA were worse. Now doing better. Tolerating TPX.  UPDATE 01/25/15: Since last visit, doing well. Occ has HA (1 every 2 weeks) but milder. Overall stable. Lack of sleep is still a trigger for HA. Currently home for winter break.  UPDATE 08/25/14: Since last visit, HA resolved, since increasing TPX to 50mg  BID. Some mild word finding diff. Overall doing well.   PRIOR HPI (07/13/14): 22 year old right-handed female here for evaluation of headaches. March 2016 patient had onset of daily headaches, bitemporal, retro-orbital, pressure and throbbing sensation with blurred vision, dizziness, balance difficulties. Her vision  difficulties are described as if she is looking to the car windshield with rain, without using the windshield wipers. She reports some photophobia, phonophobia, nausea. No vomiting. Sometimes she sees "clear sparkles" during the headaches. Headaches may last all day or several hours at a time. She's had some headache at least on a daily basis since March. Patient had one episode of headache in ninth grade, with blurred vision and emergency room evaluation. She is not sure if she was ever diagnosed with migraine headache. She has family history of migraine in her father, who takes topiramate. January 2016 patient had onset of sleep disturbance, insomnia, related to change in her roommate sleeping pattern. Patient was then used to having a roommate, and when she was having to sleep alone, she had more difficulty. Typically she lays down around midnight, falls asleep around 2 AM, wakes up 2-3 times in the night, and then wakes up at 11:00 in the morning or 1:00 in the afternoon. Patient also reports onset of depression and anxiety symptoms in August 2015. She is on medication and seeing a psychiatrist. No other changes in caffeine, stress, diet or exercise.   REVIEW OF SYSTEMS: Full 14 system review of systems performed and negative except: memory loss depression dizziness blurred vision.    ALLERGIES: No Known Allergies  HOME MEDICATIONS: Outpatient Medications Prior to Visit  Medication Sig Dispense Refill  . cyclobenzaprine (FLEXERIL) 5 MG tablet Take 1 tablet (5 mg total) by mouth 3 (three) times daily as needed for muscle spasms. 30 tablet 1  . diclofenac (CATAFLAM) 50 MG tablet Take  1 tablet (50 mg total) by mouth 3 (three) times daily. 30 tablet 3  . naproxen (NAPROSYN) 500 MG tablet 500 mg.  2  . norethindrone-ethinyl estradiol (JUNEL 1/20) 1-20 MG-MCG tablet Take 1 tablet by mouth daily. 21 tablet 11  . propranolol (INDERAL) 20 MG tablet Take 1 tablet (20 mg total) by mouth 2 (two) times daily.  60 tablet 6  . rizatriptan (MAXALT-MLT) 10 MG disintegrating tablet Take 1 tablet (10 mg total) by mouth as needed for migraine. May repeat in 2 hours if needed 9 tablet 11  . sertraline (ZOLOFT) 25 MG tablet TAKE 1 TABLET BY MOUTH DAILY 90 tablet 1   No facility-administered medications prior to visit.     PAST MEDICAL HISTORY: Past Medical History:  Diagnosis Date  . Anxiety   . Depression   . Migraines     PAST SURGICAL HISTORY: No past surgical history on file.  FAMILY HISTORY: Family History  Problem Relation Age of Onset  . Diabetes Father   . Cancer Mother        breast  . Cancer Maternal Grandmother 90       breast CA    SOCIAL HISTORY:  Social History   Socioeconomic History  . Marital status: Single    Spouse name: Not on file  . Number of children: Not on file  . Years of education: Not on file  . Highest education level: Not on file  Social Needs  . Financial resource strain: Not on file  . Food insecurity - worry: Not on file  . Food insecurity - inability: Not on file  . Transportation needs - medical: Not on file  . Transportation needs - non-medical: Not on file  Occupational History  . Not on file  Tobacco Use  . Smoking status: Never Smoker  . Smokeless tobacco: Never Used  Substance and Sexual Activity  . Alcohol use: No    Alcohol/week: 0.0 oz  . Drug use: No  . Sexual activity: Not on file  Other Topics Concern  . Not on file  Social History Narrative   Attending App   Sexually active   Non smoking.      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     PHYSICAL EXAM  Vitals:   02/13/17 1007  BP: (!) 146/94  Pulse: 89  Weight: 287 lb 12.8 oz (130.5 kg)  Height: 5' 6.75" (1.695 m)   Wt Readings from Last 3 Encounters:  02/13/17 287 lb 12.8 oz (130.5 kg)  02/08/17 287 lb 6.4 oz (130.4 kg)  08/29/16 277 lb 3.2 oz (125.7 kg)   Body mass index is 45.41 kg/m.  GENERAL EXAM: Patient is in no distress; well developed, nourished and  groomed; neck is supple; SOFT SPOKEN, CALM.  CARDIOVASCULAR: Regular rate and rhythm, no murmurs, no carotid bruits  NEUROLOGIC: MENTAL STATUS: awake, alert, language fluent, comprehension intact, naming intact, fund of knowledge appropriate CRANIAL NERVE: pupils equal and reactive to light, visual fields full to confrontation, extraocular muscles intact, no nystagmus, facial sensation and strength symmetric, hearing intact, palate elevates symmetrically, uvula midline, shoulder shrug symmetric, tongue midline. MOTOR: normal bulk and tone, full strength in the BUE, BLE SENSORY: normal and symmetric to light touch COORDINATION: finger-nose-finger, fine finger movements normal REFLEXES: deep tendon reflexes present and symmetric GAIT/STATION: narrow based gait    DIAGNOSTIC DATA (LABS, IMAGING, TESTING) - I reviewed patient records, labs, notes, testing and imaging myself where available.  Lab Results  Component Value Date  WBC 8.8 05/09/2016   HGB 12.4 05/09/2016   HCT 38.0 05/09/2016   MCV 83.3 05/09/2016   PLT 370.0 05/09/2016      Component Value Date/Time   NA 138 05/09/2016 0944   K 3.6 05/09/2016 0944   CL 109 05/09/2016 0944   CO2 22 05/09/2016 0944   GLUCOSE 94 05/09/2016 0944   BUN 9 05/09/2016 0944   CREATININE 0.63 05/09/2016 0944   CREATININE 0.60 08/02/2012 1440   CALCIUM 9.3 05/09/2016 0944   PROT 7.0 05/09/2016 0944   ALBUMIN 3.8 05/09/2016 0944   AST 10 05/09/2016 0944   ALT 14 05/09/2016 0944   ALKPHOS 60 05/09/2016 0944   BILITOT 0.2 05/09/2016 0944   Lab Results  Component Value Date   CHOL 212 (H) 03/29/2015   HDL 76.90 03/29/2015   LDLCALC 116 (H) 03/29/2015   TRIG 98.0 03/29/2015   CHOLHDL 3 03/29/2015   Lab Results  Component Value Date   HGBA1C 5.4 08/02/2012   No results found for: VITAMINB12 Lab Results  Component Value Date   TSH 3.84 03/29/2015    11/26/09 CT head - normal [I reviewed images myself and agree with  interpretation. -VRP]   08/25/14 Essentially normal MRI brain (with and without) demonstrating: 1. Minimal periventricular capping/gliosis and a single right frontal punctate focus of non-specific gliosis. These findings are non-specific and may be within normal limits.  2. No abnormal enhancing lesions. No acute findings.     ASSESSMENT AND PLAN  22 y.o. year old female here with new onset headaches since March 2016, with migraine features. Was doing well on TPX 50mg  twice a day in the past, but now more side effects. Now better with propranolol.   Meds tried and failed: topiramate, rizatriptan, ibuprofen   Dx:   Migraine without aura and without status migrainosus, not intractable    PLAN:  I spent 15 minutes of face to face time with patient. Greater than 50% of time was spent in counseling and coordination of care with patient. In summary we discussed:   MIGRAINE PREVENTION - continue propranolol 20mg  twice a day - consider aimovig Watson injection in future  MIGRAINE RESCUE - diclofenac 50mg  as needed for migraine - medicine benefits and side effects reviewed  Meds ordered this encounter  Medications  . propranolol (INDERAL) 20 MG tablet    Sig: Take 1 tablet (20 mg total) by mouth 2 (two) times daily.    Dispense:  180 tablet    Refill:  4  . diclofenac (CATAFLAM) 50 MG tablet    Sig: Take 1 tablet (50 mg total) by mouth daily as needed (for headache).    Dispense:  30 tablet    Refill:  3   Return in about 6 months (around 08/13/2017) for with NP/PA or Lauris Keepers.    Suanne Marker, MD 02/13/2017, 10:31 AM Certified in Neurology, Neurophysiology and Neuroimaging  [facility] [address] [place], [place] [address] [phone]

## 2017-02-16 DIAGNOSIS — M545 Low back pain: Secondary | ICD-10-CM | POA: Diagnosis not present

## 2017-05-01 ENCOUNTER — Other Ambulatory Visit: Payer: Self-pay | Admitting: Family Medicine

## 2017-08-07 ENCOUNTER — Ambulatory Visit: Payer: Federal, State, Local not specified - PPO | Admitting: Adult Health

## 2017-08-07 DIAGNOSIS — K080 Exfoliation of teeth due to systemic causes: Secondary | ICD-10-CM | POA: Diagnosis not present

## 2017-08-08 ENCOUNTER — Encounter: Payer: Self-pay | Admitting: Adult Health

## 2017-08-08 ENCOUNTER — Ambulatory Visit: Payer: Federal, State, Local not specified - PPO | Admitting: Adult Health

## 2017-08-08 VITALS — BP 137/83 | HR 93 | Ht 67.0 in | Wt 281.0 lb

## 2017-08-08 DIAGNOSIS — G43009 Migraine without aura, not intractable, without status migrainosus: Secondary | ICD-10-CM

## 2017-08-08 MED ORDER — DICLOFENAC POTASSIUM 50 MG PO TABS: 50.0000 mg | ORAL_TABLET | Freq: Every day | ORAL | 3 refills | Status: DC | PRN

## 2017-08-08 NOTE — Progress Notes (Signed)
I reviewed note and agree with plan.   Suanne MarkerVIKRAM R. Faye Strohman, MD 08/08/2017, 2:38 PM Certified in Neurology, Neurophysiology and Neuroimaging  [facility] [address], [address] [place], [place] [phone]

## 2017-08-08 NOTE — Patient Instructions (Signed)
Your Plan:  Continue propranolol and Diclofenac If your symptoms worsen or you develop new symptoms please let us know.       Thank you for coming to see us at [facility]. I hope we have been able to provide you high quality care today.  You may receive a patient satisfaction survey over the next few weeks. We would appreciate your feedback and comments so that we may continue to improve ourselves and the health of our patients.

## 2017-08-08 NOTE — Progress Notes (Signed)
Kylie Ware: Kylie Ware DOB: [birth year]  REASON FOR VISIT: follow up HISTORY FROM: Kylie Ware  HISTORY OF PRESENT ILLNESS: Today 08/08/17: Kylie Ware is a 22 year old female with a history of migraine headaches.  She returns today for follow-up.  She is currently on propranolol and diclofenac.  She reports that she has not had a migraine in several months.  She states that she has to take diclofenac 1-2 times a week that she will occasionally wake up with a dull headache.  He states that this is due because she does not sleep for 4 hours some nights due to her work schedule.  She is currently in school at [employer].  She returns today for evaluation.   UPDATE (02/13/17, VRP): Since last visit, was doing well with addition of propranolol 20mg  twice a day and HA resolved, until exams during December 2018. Also forgot to take propranolol for 2 weeks during stressful time and HA worsened. Now back on propranolol and doing well. Tolerating diclofenac (effective). Aggravating factors are stress and lack of sleep. Has mild HA today.   UPDATE 08/29/16: Since last visit, was doing well with HA, but then noted more memory issues, and therefore decr her TPX to 25mg  at bedtime. Now HA are 2x / week.   UPDATE 01/26/16: Since last visit, now with more HA (now every other day), related to school stress. Overall tolerating TPX. Using ibuprofen or naproxen for migraine rescue, but not as effective anymore.   UPDATE 07/26/15: Since last visit, doing well. Avg 1 HA per month. During classes and finals, HA were worse. Now doing better. Tolerating TPX.  UPDATE 01/25/15: Since last visit, doing well. Occ has HA (1 every 2 weeks) but milder. Overall stable. Lack of sleep is still a trigger for HA. Currently home for winter break.  UPDATE 08/25/14: Since last visit, HA resolved, since increasing TPX to 50mg  BID. Some mild word finding diff. Overall doing well.   PRIOR HPI (07/13/14): 22 year old  right-handed female here for evaluation of headaches. March 2016 Kylie Ware had onset of daily headaches, bitemporal, retro-orbital, pressure and throbbing sensation with blurred vision, dizziness, balance difficulties. Her vision difficulties are described as if she is looking to the car windshield with rain, without using the windshield wipers. She reports some photophobia, phonophobia, nausea. No vomiting. Sometimes she sees "clear sparkles" during the headaches. Headaches may last all day or several hours at a time. She's had some headache at least on a daily basis since March. Kylie Ware had one episode of headache in ninth grade, with blurred vision and emergency room evaluation. She is not sure if she was ever diagnosed with migraine headache. She has family history of migraine in her father, who takes topiramate. January 2016 Kylie Ware had onset of sleep disturbance, insomnia, related to change in her roommate sleeping pattern. Kylie Ware was then used to having a roommate, and when she was having to sleep alone, she had more difficulty. Typically she lays down around midnight, falls asleep around 2 AM, wakes up 2-3 times in the night, and then wakes up at 11:00 in the morning or 1:00 in the afternoon. Kylie Ware also reports onset of depression and anxiety symptoms in August 2015. She is on medication and seeing a psychiatrist. No other changes in caffeine, stress, diet or exercise.    REVIEW OF SYSTEMS: Out of a complete 14 system review of symptoms, the Kylie Ware complains only of the following symptoms, and all other reviewed systems are negative.  See HPI  ALLERGIES: No Known Allergies  HOME MEDICATIONS: Outpatient Medications Prior to Visit  Medication Sig Dispense Refill  . cyclobenzaprine (FLEXERIL) 5 MG tablet Take 1 tablet (5 mg total) by mouth 3 (three) times daily as needed for muscle spasms. 30 tablet 1  . diclofenac (CATAFLAM) 50 MG tablet Take 1 tablet (50 mg total) by mouth daily as needed  (for headache). 30 tablet 3  . naproxen (NAPROSYN) 500 MG tablet 500 mg.  2  . norethindrone-ethinyl estradiol (JUNEL 1/20) 1-20 MG-MCG tablet Take 1 tablet by mouth daily. 21 tablet 11  . propranolol (INDERAL) 20 MG tablet Take 1 tablet (20 mg total) by mouth 2 (two) times daily. 180 tablet 4  . sertraline (ZOLOFT) 25 MG tablet TAKE 1 TABLET BY MOUTH DAILY 90 tablet 1   No facility-administered medications prior to visit.     PAST MEDICAL HISTORY: Past Medical History:  Diagnosis Date  . Anxiety   . Depression   . Migraines     PAST SURGICAL HISTORY: No past surgical history on file.  FAMILY HISTORY: Family History  Problem Relation Age of Onset  . Diabetes Father   . Cancer Mother        breast  . Cancer Maternal Grandmother 51       breast CA    SOCIAL HISTORY: Social History   Socioeconomic History  . Marital status: Single    Spouse name: Not on file  . Number of children: Not on file  . Years of education: Not on file  . Highest education level: Not on file  Occupational History  . Not on file  Social Needs  . Financial resource strain: Not on file  . Food insecurity:    Worry: Not on file    Inability: Not on file  . Transportation needs:    Medical: Not on file    Non-medical: Not on file  Tobacco Use  . Smoking status: Never Smoker  . Smokeless tobacco: Never Used  Substance and Sexual Activity  . Alcohol use: No    Alcohol/week: 0.0 oz  . Drug use: No  . Sexual activity: Not on file  Lifestyle  . Physical activity:    Days per week: Not on file    Minutes per session: Not on file  . Stress: Not on file  Relationships  . Social connections:    Talks on phone: Not on file    Gets together: Not on file    Attends religious service: Not on file    Active member of club or organization: Not on file    Attends meetings of clubs or organizations: Not on file    Relationship status: Not on file  . Intimate partner violence:    Fear of current  or ex partner: Not on file    Emotionally abused: Not on file    Physically abused: Not on file    Forced sexual activity: Not on file  Other Topics Concern  . Not on file  Social History Narrative   Attending App   Sexually active   Non smoking.      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      PHYSICAL EXAM  Vitals:   08/08/17 1400  BP: 137/83  Pulse: 93  Weight: 281 lb (127.5 kg)  Height: 5\' 7"  (1.702 m)   Body mass index is 44.01 kg/m.  Generalized: Well developed, in no acute distress   Neurological examination  Mentation: Alert oriented to time, place, history  taking. Follows all commands speech and language fluent Cranial nerve II-XII: Pupils were equal round reactive to light. Extraocular movements were full, visual field were full on confrontational test. Facial sensation and strength were normal. Uvula tongue midline. Head turning and shoulder shrug  were normal and symmetric. Motor: The motor testing reveals 5 over 5 strength of all 4 extremities. Good symmetric motor tone is noted throughout.  Sensory: Sensory testing is intact to soft touch on all 4 extremities. No evidence of extinction is noted.  Coordination: Cerebellar testing reveals good finger-nose-finger and heel-to-shin bilaterally.  Gait and station: Gait is normal. Reflexes: Deep tendon reflexes are symmetric and normal bilaterally.   DIAGNOSTIC DATA (LABS, IMAGING, TESTING) - I reviewed Kylie Ware records, labs, notes, testing and imaging myself where available.  Lab Results  Component Value Date   WBC 8.8 05/09/2016   HGB 12.4 05/09/2016   HCT 38.0 05/09/2016   MCV 83.3 05/09/2016   PLT 370.0 05/09/2016      Component Value Date/Time   NA 138 05/09/2016 0944   K 3.6 05/09/2016 0944   CL 109 05/09/2016 0944   CO2 22 05/09/2016 0944   GLUCOSE 94 05/09/2016 0944   BUN 9 05/09/2016 0944   CREATININE 0.63 05/09/2016 0944   CREATININE 0.60 08/02/2012 1440   CALCIUM 9.3 05/09/2016 0944   PROT 7.0  05/09/2016 0944   ALBUMIN 3.8 05/09/2016 0944   AST 10 05/09/2016 0944   ALT 14 05/09/2016 0944   ALKPHOS 60 05/09/2016 0944   BILITOT 0.2 05/09/2016 0944   Lab Results  Component Value Date   CHOL 212 (H) 03/29/2015   HDL 76.90 03/29/2015   LDLCALC 116 (H) 03/29/2015   TRIG 98.0 03/29/2015   CHOLHDL 3 03/29/2015   Lab Results  Component Value Date   HGBA1C 5.4 08/02/2012   No results found for: [id number] Lab Results  Component Value Date   TSH 3.84 03/29/2015      ASSESSMENT AND PLAN 22 y.o. year old female  has a past medical history of Anxiety, Depression, and Migraines. here with:  1.  Migraine headaches  Overall the Kylie Ware is doing well.  She will continue on propranolol and diclofenac.  She is advised that if her symptoms worsen or she develops new symptoms she should let us know.  She will follow-up in 6 months or sooner if needed.  I spent 15 minutes with the Kylie Ware. 50% of this time was spent discussing her medication   Butch PennyMegan Tamecia Mcdougald, MSN, NP-C 08/08/2017, 1:21 PM [facility] [address], [address] [place], [place] [id number] [phone]

## 2017-11-05 ENCOUNTER — Telehealth: Payer: Self-pay | Admitting: Adult Health

## 2017-11-05 ENCOUNTER — Encounter: Payer: Self-pay | Admitting: Adult Health

## 2017-11-05 NOTE — Telephone Encounter (Signed)
error 

## 2017-11-07 NOTE — Telephone Encounter (Signed)
Used link provided and received the information for letter needed by pt.

## 2017-11-12 NOTE — Telephone Encounter (Signed)
Spoke to pt and made appt for her on Tuesday 11-20-17 when she is home for spring break.  It is at 0800, be here 0730.  She verbalized understanding.

## 2017-11-20 ENCOUNTER — Encounter: Payer: Self-pay | Admitting: Adult Health

## 2017-11-20 ENCOUNTER — Ambulatory Visit: Payer: Federal, State, Local not specified - PPO | Admitting: Adult Health

## 2017-11-20 VITALS — BP 127/85 | HR 84 | Ht 67.0 in | Wt 273.6 lb

## 2017-11-20 DIAGNOSIS — G43009 Migraine without aura, not intractable, without status migrainosus: Secondary | ICD-10-CM | POA: Diagnosis not present

## 2017-11-20 MED ORDER — DICLOFENAC POTASSIUM 50 MG PO TABS: 50.0000 mg | ORAL_TABLET | Freq: Every day | ORAL | 3 refills | Status: DC | PRN

## 2017-11-20 MED ORDER — PROPRANOLOL HCL 20 MG PO TABS: 20.0000 mg | ORAL_TABLET | Freq: Two times a day (BID) | ORAL | 4 refills | Status: DC

## 2017-11-20 NOTE — Patient Instructions (Signed)
Your Plan:  Continue propranolol and Diclofenac Must take propranolol consistently and do not stop abruptly  If your symptoms worsen or you develop new symptoms please let us know.   Thank you for coming to see [place] at [facility]. I hope we have been able to provide you high quality care today.  You may receive a patient satisfaction survey over the next few weeks. We would appreciate your feedback and comments so that we may continue to improve ourselves and the health of our patients.

## 2017-11-20 NOTE — Progress Notes (Signed)
Kylie Ware: Kylie Ware DOB: Nov 22, 1995  REASON FOR VISIT: follow up HISTORY FROM: Kylie Ware  HISTORY OF PRESENT ILLNESS: Today 11/20/17: Kylie Ware is a 22 year old female with a history of migraine headaches.  She returns today for follow-up.  She reports that she is having approximately 3 headaches a week.  She states that her headaches typically occur in the temporal region bilaterally or behind the right eye.  He states that typically at the beginning or near the middle of her headache she will have visual disturbance.  Typically what appears to be a visual loss in her visual field.  She states that she is not completely without vision during this event.  She does have some photophobia and phonophobia.  She will also have nausea and vomiting.  She denies any numbness and tingling in the arms or legs.  She states that she discontinued propranolol approximately 1 month ago.  This is approximately when her headaches worsened.  She states that she stopped the medication because her headaches were doing well.  She returns today for evaluation.   HISTORY 08/08/17: Kylie Ware is a 22 year old female with a history of migraine headaches.  She returns today for follow-up.  She is currently on propranolol and diclofenac.  She reports that she has not had a migraine in several months.  She states that she has to take diclofenac 1-2 times a week that she will occasionally wake up with a dull headache.  He states that this is due because she does not sleep for 4 hours some nights due to her work schedule.  She is currently in school at [employer].  She returns today for evaluation.   UPDATE (02/13/17, VRP): Since last visit,was doing well with addition of propranolol 20mg twice a dayand HA resolved, until exams during December 2018. Also forgot to take propranolol for 2 weeks during stressful time and HA worsened.Now back on propranolol and doing well.Toleratingdiclofenac  (effective).Aggravating factorsare stress and lack of sleep. Has mild HA today.  UPDATE 08/29/16: Since last visit, was doing well with HA, but then noted more memory issues, and therefore decr her TPX to 25mg  at bedtime. Now HA are 2x / week.   UPDATE 01/26/16: Since last visit, now with more HA (now every other day), related to school stress. Overall tolerating TPX. Using ibuprofen or naproxen for migraine rescue, but not as effective anymore.   UPDATE 07/26/15: Since last visit, doing well. Avg 1 HA per month. During classes and finals, HA were worse. Now doing better. Tolerating TPX.  UPDATE 01/25/15: Since last visit, doing well. Occ has HA (1 every 2 weeks) but milder. Overall stable. Lack of sleep is still a trigger for HA. Currently home for winter break.  UPDATE 08/25/14: Since last visit, HA resolved, since increasing TPX to 50mg  BID. Some mild word finding diff. Overall doing well.   PRIOR HPI (07/13/14): 22 year old right-handed female here for evaluation of headaches. March 2016 Kylie Ware had onset of daily headaches, bitemporal, retro-orbital, pressure and throbbing sensation with blurred vision, dizziness, balance difficulties. Her vision difficulties are described as if she is looking to the car windshield with rain, without using the windshield wipers. She reports some photophobia, phonophobia, nausea. No vomiting. Sometimes she sees "clear sparkles" during the headaches. Headaches may last all day or several hours at a time. She's had some headache at least on a daily basis since March. Kylie Ware had one episode of headache in ninth grade, with blurred vision and emergency room evaluation.  She is not sure if she was ever diagnosed with migraine headache. She has family history of migraine in her father, who takes topiramate. January 2016 Kylie Ware had onset of sleep disturbance, insomnia, related to change in her roommate sleeping pattern. Kylie Ware was then used to having a roommate, and  when she was having to sleep alone, she had more difficulty. Typically she lays down around midnight, falls asleep around 2 AM, wakes up 2-3 times in the night, and then wakes up at 11:00 in the morning or 1:00 in the afternoon. Kylie Ware also reports onset of depression and anxiety symptoms in August 2015. She is on medication and seeing a psychiatrist. No other changes in caffeine, stress, diet or exercise.     REVIEW OF SYSTEMS: Out of a complete 14 system review of symptoms, the Kylie Ware complains only of the following symptoms, and all other reviewed systems are negative.  Light, loss of vision, blurred vision, appetite change, nausea, back pain, dizziness, headaches  ALLERGIES: No Known Allergies  HOME MEDICATIONS: Outpatient Medications Prior to Visit  Medication Sig Dispense Refill  . cyclobenzaprine (FLEXERIL) 5 MG tablet Take 1 tablet (5 mg total) by mouth 3 (three) times daily as needed for muscle spasms. 30 tablet 1  . diclofenac (CATAFLAM) 50 MG tablet Take 1 tablet (50 mg total) by mouth daily as needed (for headache). 30 tablet 3  . naproxen (NAPROSYN) 500 MG tablet 500 mg.  2  . norethindrone-ethinyl estradiol (JUNEL 1/20) 1-20 MG-MCG tablet Take 1 tablet by mouth daily. 21 tablet 11  . propranolol (INDERAL) 20 MG tablet Take 1 tablet (20 mg total) by mouth 2 (two) times daily. 180 tablet 4  . sertraline (ZOLOFT) 25 MG tablet TAKE 1 TABLET BY MOUTH DAILY 90 tablet 1   No facility-administered medications prior to visit.     PAST MEDICAL HISTORY: Past Medical History:  Diagnosis Date  . Anxiety   . Depression   . Migraines     PAST SURGICAL HISTORY: History reviewed. No pertinent surgical history.  FAMILY HISTORY: Family History  Problem Relation Age of Onset  . Diabetes Father   . Cancer Mother        breast  . Cancer Maternal Grandmother 44       breast CA    SOCIAL HISTORY: Social History   Socioeconomic History  . Marital status: Single    Spouse  name: Not on file  . Number of children: Not on file  . Years of education: Not on file  . Highest education level: Not on file  Occupational History  . Not on file  Social Needs  . Financial resource strain: Not on file  . Food insecurity:    Worry: Not on file    Inability: Not on file  . Transportation needs:    Medical: Not on file    Non-medical: Not on file  Tobacco Use  . Smoking status: Never Smoker  . Smokeless tobacco: Never Used  Substance and Sexual Activity  . Alcohol use: No    Alcohol/week: 0.0 standard drinks  . Drug use: No  . Sexual activity: Not on file  Lifestyle  . Physical activity:    Days per week: Not on file    Minutes per session: Not on file  . Stress: Not on file  Relationships  . Social connections:    Talks on phone: Not on file    Gets together: Not on file    Attends religious service: Not on file  Active member of club or organization: Not on file    Attends meetings of clubs or organizations: Not on file    Relationship status: Not on file  . Intimate partner violence:    Fear of current or ex partner: Not on file    Emotionally abused: Not on file    Physically abused: Not on file    Forced sexual activity: Not on file  Other Topics Concern  . Not on file  Social History Narrative   Attending App   Sexually active   Non smoking.      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      PHYSICAL EXAM  Vitals:   11/20/17 0745  BP: 127/85  Pulse: 84  Weight: 273 lb 9.6 oz (124.1 kg)  Height: 5\' 7"  (1.702 m)   Body mass index is 42.85 kg/m.  Generalized: Well developed, in no acute distress   Neurological examination  Mentation: Alert oriented to time, place, history taking. Follows all commands speech and language fluent Cranial nerve II-XII: Pupils were equal round reactive to light. Extraocular movements were full, visual field were full on confrontational test. Facial sensation and strength were normal. Uvula tongue midline.  Head turning and shoulder shrug  were normal and symmetric. Motor: The motor testing reveals 5 over 5 strength of all 4 extremities. Good symmetric motor tone is noted throughout.  Sensory: Sensory testing is intact to soft touch on all 4 extremities. No evidence of extinction is noted.  Coordination: Cerebellar testing reveals good finger-nose-finger and heel-to-shin bilaterally.  Gait and station: Gait is normal. Reflexes: Deep tendon reflexes are symmetric and normal bilaterally.   DIAGNOSTIC DATA (LABS, IMAGING, TESTING) - I reviewed Kylie Ware records, labs, notes, testing and imaging myself where available.  Lab Results  Component Value Date   WBC 8.8 05/09/2016   HGB 12.4 05/09/2016   HCT 38.0 05/09/2016   MCV 83.3 05/09/2016   PLT 370.0 05/09/2016      Component Value Date/Time   NA 138 05/09/2016 0944   K 3.6 05/09/2016 0944   CL 109 05/09/2016 0944   CO2 22 05/09/2016 0944   GLUCOSE 94 05/09/2016 0944   BUN 9 05/09/2016 0944   CREATININE 0.63 05/09/2016 0944   CREATININE 0.60 08/02/2012 1440   CALCIUM 9.3 05/09/2016 0944   PROT 7.0 05/09/2016 0944   ALBUMIN 3.8 05/09/2016 0944   AST 10 05/09/2016 0944   ALT 14 05/09/2016 0944   ALKPHOS 60 05/09/2016 0944   BILITOT 0.2 05/09/2016 0944   Lab Results  Component Value Date   CHOL 212 (H) 03/29/2015   HDL 76.90 03/29/2015   LDLCALC 116 (H) 03/29/2015   TRIG 98.0 03/29/2015   CHOLHDL 3 03/29/2015   Lab Results  Component Value Date   HGBA1C 5.4 08/02/2012   No results found for: [id number] Lab Results  Component Value Date   TSH 3.84 03/29/2015      ASSESSMENT AND PLAN 21 y.o. year old female  has a past medical history of Anxiety, Depression, and Migraines. here with:  1.  Migraine headaches  The Kylie Ware will restart propranolol 20 mg twice a day.  She will continue using diclofenac if needed for her headache.  She is advised that she should take medication consistently.  She is also advised against  stopping this medication abruptly.  If her headaches do not improve she will let us know.  At this time I will not provide a letter to her college asking for accommodations  for her headaches.  Her headaches were well controlled when she was taking propranolol consistently.  If she restarts propranolol and her headaches continue we will reconsider.  Kylie Ware voiced understanding.  She will follow-up in 6 months or sooner if needed.   Butch Penny, MSN, NP-C 11/20/2017, 8:03 AM [facility] [address], [address] [place], [place] [address] [phone]

## 2017-11-21 NOTE — Progress Notes (Signed)
I reviewed note and agree with plan.   Suanne Marker, MD 11/21/2017, 7:19 PM Certified in Neurology, Neurophysiology and Neuroimaging  [facility] [address], [address] [place], [place] [address] [phone]

## 2017-12-24 ENCOUNTER — Encounter: Payer: Self-pay | Admitting: Adult Health

## 2017-12-26 ENCOUNTER — Encounter: Payer: Self-pay | Admitting: Adult Health

## 2017-12-26 NOTE — Telephone Encounter (Signed)
Letter printed and signed. Please contact patient.

## 2018-05-27 ENCOUNTER — Telehealth: Payer: Self-pay | Admitting: Family Medicine

## 2018-05-27 NOTE — Telephone Encounter (Signed)
Called pt on behalf of Dr Dayton Martes because it has been a while since last visit and was making sure she knew we were offering virtual appts

## 2018-07-18 ENCOUNTER — Telehealth: Payer: Self-pay

## 2018-07-18 NOTE — Telephone Encounter (Signed)
Spoke with the patient and they have given verbal consent to file insurance and to do a mychart video visit. Mychart video link has been sent to the patient.   

## 2018-07-22 ENCOUNTER — Telehealth (INDEPENDENT_AMBULATORY_CARE_PROVIDER_SITE_OTHER): Payer: Federal, State, Local not specified - PPO | Admitting: Family Medicine

## 2018-07-22 ENCOUNTER — Other Ambulatory Visit: Payer: Self-pay

## 2018-07-22 ENCOUNTER — Encounter: Payer: Self-pay | Admitting: Family Medicine

## 2018-07-22 DIAGNOSIS — G43009 Migraine without aura, not intractable, without status migrainosus: Secondary | ICD-10-CM

## 2018-07-22 NOTE — Progress Notes (Signed)
PATIENT: Kylie Ware DOB: 1995/08/26  REASON FOR VISIT: follow up HISTORY FROM: patient  Virtual Visit via Telephone Note  I connected with Kylie Ware on 07/22/18 at 11:30 AM EDT by telephone and verified that I am speaking with the correct person using two identifiers.   I discussed the limitations, risks, security and privacy concerns of performing an evaluation and management service by telephone and the availability of in person appointments. I also discussed with the patient that there may be a patient responsible charge related to this service. The patient expressed understanding and agreed to proceed.   History of Present Illness:  07/22/18 Kylie Ware is a 23 y.o. female here today for follow up of migraines.  She continues to do well on propranolol 20 mg twice daily.  She is using diclofenac 50 mg as needed for abortive therapy.  She is also taking Zoloft.  She feels that her migraines are well managed.  She rarely has to use abortive therapy.  She states that since the pandemic her stress levels have decreased significantly and she feels this is helped with her headaches.   HISTORY (copied from Kylie Ware note on 11/20/2017)  Ms. Kylie Ware is a 23 year old female with a history of migraine headaches.  She returns today for follow-up.  She reports that she is having approximately 3 headaches a week.  She states that her headaches typically occur in the temporal region bilaterally or behind the right eye.  He states that typically at the beginning or near the middle of her headache she will have visual disturbance. Typically what appears to be a visual loss in her visual field.  She states that she is not completely without vision during this event.  She does have some photophobia and phonophobia.  She will also have nausea and vomiting.  She denies any numbness and tingling in the arms or legs.  She states that she discontinued propranolol approximately 1 month  ago.  This is approximately when her headaches worsened.  She states that she stopped the medication because her headaches were doing well.  She returns today for evaluation.   HISTORY 08/08/17: Kylie Ware is a 23 year old female with a history of migraine headaches. She returns today for follow-up. She is currently on propranololand diclofenac. She reports that she has not had a migraine in several months. She states that she has to take diclofenac 1-2 times a week that she will occasionally wake up with a dull headache. He states that this is due because she does not sleep for 4 hours some nights due to her work schedule. She is currently in school at [employer]. She returns today for evaluation.  UPDATE (02/13/17, VRP): Since last visit,was doing well with addition of propranolol 20mg twice a dayand HA resolved, until exams during December 2018. Also forgot to take propranolol for 2 weeks during stressful time and HA worsened.Now back on propranolol and doing well.Toleratingdiclofenac (effective).Aggravating factorsare stress and lack of sleep. Has mild HA today.  UPDATE 08/29/16: Since last visit, was doing well with HA, but then noted more memory issues, and therefore decr her TPX to 25mg  at bedtime. Now HA are 2x / week.   UPDATE 01/26/16: Since last visit, now with more HA (now every other day), related to school stress. Overall tolerating TPX. Using ibuprofen or naproxen for migraine rescue, but not as effective anymore.   UPDATE 07/26/15: Since last visit, doing well. Avg 1 HA per month. During classes and finals, HA were  worse. Now doing better. Tolerating TPX.  UPDATE 01/25/15: Since last visit, doing well. Occ has HA (1 every 2 weeks) but milder. Overall stable. Lack of sleep is still a trigger for HA. Currently home for winter break.  UPDATE 08/25/14: Since last visit, HA resolved, since increasing TPX to 50mg  BID. Some mild word finding diff. Overall  doing well.   PRIOR HPI (07/13/14): 23 year old right-handed female here for evaluation of headaches. March 2016 patient had onset of daily headaches, bitemporal, retro-orbital, pressure and throbbing sensation with blurred vision, dizziness, balance difficulties. Her vision difficulties are described as if she is looking to the car windshield with rain, without using the windshield wipers. She reports some photophobia, phonophobia, nausea. No vomiting. Sometimes she sees "clear sparkles" during the headaches. Headaches may last all day or several hours at a time. She's had some headache at least on a daily basis since March. Patient had one episode of headache in ninth grade, with blurred vision and emergency room evaluation. She is not sure if she was ever diagnosed with migraine headache. She has family history of migraine in her father, who takes topiramate. January 2016 patient had onset of sleep disturbance, insomnia, related to change in her roommate sleeping pattern. Patient was then used to having a roommate, and when she was having to sleep alone, she had more difficulty. Typically she lays down around midnight, falls asleep around 2 AM, wakes up 2-3 times in the night, and then wakes up at 11:00 in the morning or 1:00 in the afternoon. Patient also reports onset of depression and anxiety symptoms in August 2015. She is on medication and seeing a psychiatrist. No other changes in caffeine, stress, diet or exercise.    Observations/Objective:  Generalized: Well developed, in no acute distress  Mentation: Alert oriented to time, place, history taking. Follows all commands speech and language fluent   Assessment and Plan:  23 y.o. year old female  has a past medical history of Anxiety, Depression, and Migraines. here with    ICD-10-CM   1. Migraine without aura and without status migrainosus, not intractable  G43.009    Kylie Ware continues to do well with propranolol 20 mg twice daily and  diclofenac 50 mg as abortive therapy.  She has had very few migraines since her last visit with us 6 months ago.  We will continue current therapy.  I have encouraged her to continue with a low stress environment and adequate hydration.  She will follow-up with us in 1 year, sooner if needed.  She verbalizes understanding and agreement with this plan.  Appointment has been scheduled.  No orders of the defined types were placed in this encounter.   No orders of the defined types were placed in this encounter.    Follow Up Instructions:  I discussed the assessment and treatment plan with the patient. The patient was provided an opportunity to ask questions and all were answered. The patient agreed with the plan and demonstrated an understanding of the instructions.   The patient was advised to call back or seek an in-person evaluation if the symptoms worsen or if the condition fails to improve as anticipated.  I provided 25 minutes of non-face-to-face time during this encounter.  Patient is located at her place of residence during teleconference.  Video conferencing unavailable due to technological issues.  Provider is located at her place of residence.  Rozell SearingBrittany Duff, CMA helped to facilitate visit.   Shawnie DapperAmy Ivette Castronova, NP

## 2018-08-05 NOTE — Progress Notes (Signed)
I reviewed note and agree with plan.   Penni Bombard, MD 8/85/0277, 4:12 PM Certified in Neurology, Neurophysiology and Neuroimaging  [facility] [address], [place], [place] [address] [phone]

## 2018-08-12 ENCOUNTER — Ambulatory Visit: Payer: Federal, State, Local not specified - PPO | Admitting: Adult Health

## 2019-04-04 DIAGNOSIS — Z20822 Contact with and (suspected) exposure to covid-19: Secondary | ICD-10-CM | POA: Diagnosis not present

## 2019-04-04 DIAGNOSIS — Z20828 Contact with and (suspected) exposure to other viral communicable diseases: Secondary | ICD-10-CM | POA: Diagnosis not present

## 2019-04-07 DIAGNOSIS — Z20822 Contact with and (suspected) exposure to covid-19: Secondary | ICD-10-CM | POA: Diagnosis not present

## 2019-04-07 DIAGNOSIS — Z20828 Contact with and (suspected) exposure to other viral communicable diseases: Secondary | ICD-10-CM | POA: Diagnosis not present

## 2019-05-04 ENCOUNTER — Emergency Department (HOSPITAL_BASED_OUTPATIENT_CLINIC_OR_DEPARTMENT_OTHER): Payer: Federal, State, Local not specified - PPO

## 2019-05-04 ENCOUNTER — Other Ambulatory Visit: Payer: Self-pay

## 2019-05-04 ENCOUNTER — Emergency Department (HOSPITAL_BASED_OUTPATIENT_CLINIC_OR_DEPARTMENT_OTHER)
Admission: EM | Admit: 2019-05-04 | Discharge: 2019-05-05 | Disposition: A | Discharge status: Home / Self Care | Payer: Federal, State, Local not specified - PPO | Attending: Emergency Medicine | Admitting: Emergency Medicine

## 2019-05-04 ENCOUNTER — Encounter (HOSPITAL_BASED_OUTPATIENT_CLINIC_OR_DEPARTMENT_OTHER): Payer: Self-pay | Admitting: Emergency Medicine

## 2019-05-04 DIAGNOSIS — S82892A Other fracture of left lower leg, initial encounter for closed fracture: Secondary | ICD-10-CM | POA: Insufficient documentation

## 2019-05-04 DIAGNOSIS — Z79899 Other long term (current) drug therapy: Secondary | ICD-10-CM | POA: Diagnosis not present

## 2019-05-04 DIAGNOSIS — Y93A1 Activity, exercise machines primarily for cardiorespiratory conditioning: Secondary | ICD-10-CM | POA: Diagnosis not present

## 2019-05-04 DIAGNOSIS — S99912A Unspecified injury of left ankle, initial encounter: Secondary | ICD-10-CM | POA: Diagnosis not present

## 2019-05-04 DIAGNOSIS — Z793 Long term (current) use of hormonal contraceptives: Secondary | ICD-10-CM | POA: Insufficient documentation

## 2019-05-04 DIAGNOSIS — W1789XA Other fall from one level to another, initial encounter: Secondary | ICD-10-CM | POA: Diagnosis not present

## 2019-05-04 DIAGNOSIS — Y929 Unspecified place or not applicable: Secondary | ICD-10-CM | POA: Diagnosis not present

## 2019-05-04 DIAGNOSIS — S82452A Displaced comminuted fracture of shaft of left fibula, initial encounter for closed fracture: Secondary | ICD-10-CM | POA: Diagnosis not present

## 2019-05-04 DIAGNOSIS — Y999 Unspecified external cause status: Secondary | ICD-10-CM | POA: Diagnosis not present

## 2019-05-04 DIAGNOSIS — W19XXXA Unspecified fall, initial encounter: Secondary | ICD-10-CM

## 2019-05-04 DIAGNOSIS — S82842A Displaced bimalleolar fracture of left lower leg, initial encounter for closed fracture: Secondary | ICD-10-CM | POA: Diagnosis not present

## 2019-05-04 DIAGNOSIS — S9305XA Dislocation of left ankle joint, initial encounter: Secondary | ICD-10-CM

## 2019-05-04 DIAGNOSIS — S82832A Other fracture of upper and lower end of left fibula, initial encounter for closed fracture: Secondary | ICD-10-CM | POA: Diagnosis not present

## 2019-05-04 LAB — HCG, SERUM, QUALITATIVE: Preg, Serum: NEGATIVE

## 2019-05-04 MED ORDER — FENTANYL CITRATE (PF) 100 MCG/2ML IJ SOLN
INTRAMUSCULAR | Status: AC | PRN
  Administered 2019-05-04: 100 ug via INTRAVENOUS

## 2019-05-04 MED ORDER — ETOMIDATE 2 MG/ML IV SOLN
10.0000 mg | Freq: Once | INTRAVENOUS | Status: AC
  Administered 2019-05-04: 10 mg via INTRAVENOUS
  Filled 2019-05-04: qty 10

## 2019-05-04 MED ORDER — FENTANYL CITRATE (PF) 100 MCG/2ML IJ SOLN
INTRAMUSCULAR | Status: AC
  Filled 2019-05-04: qty 2

## 2019-05-04 MED ORDER — MORPHINE SULFATE (PF) 4 MG/ML IV SOLN
4.0000 mg | Freq: Once | INTRAVENOUS | Status: AC
  Administered 2019-05-04: 4 mg via INTRAVENOUS
  Filled 2019-05-04: qty 1

## 2019-05-04 MED ORDER — ONDANSETRON HCL 4 MG/2ML IJ SOLN
4.0000 mg | Freq: Once | INTRAMUSCULAR | Status: AC
  Administered 2019-05-04: 21:00:00 4 mg via INTRAVENOUS
  Filled 2019-05-04: qty 2

## 2019-05-04 MED ORDER — ETOMIDATE 2 MG/ML IV SOLN
INTRAVENOUS | Status: AC | PRN
  Administered 2019-05-04: 5 mg via INTRAVENOUS

## 2019-05-04 NOTE — Sedation Documentation (Signed)
Reduction complete, splint underway

## 2019-05-04 NOTE — ED Triage Notes (Signed)
Left ankle pain and deformity after falling off the tread mill.

## 2019-05-04 NOTE — ED Provider Notes (Signed)
[facility] EMERGENCY DEPARTMENT Provider Note   CSN: [id number] Arrival date & time: 05/04/19  2104     History Chief Complaint  Patient presents with  . Ankle Injury    Kylie Ware is a 24 y.o. female with past medical history of anxiety, depression, migraines, who presents today for evaluation of left ankle pain.  She reports that shortly prior to arrival she fell off a treadmill with immediate onset of pain in her left ankle.  She did temporarily have left proximal lower leg pain.  She denies any numbness.  She has been unable to bear weight.  Her last solid intake was at 6 PM when she had dinner.  Drink water at about 8 PM.  She denies striking her head or passing out.  No other injuries reported.  HPI     Past Medical History:  Diagnosis Date  . Anxiety   . Depression   . Migraines     Patient Active Problem List   Diagnosis Date Noted  . Migraine without aura and without status migrainosus, not intractable 07/22/2018  . Visit for routine gyn exam 02/08/2017  . Anxiety and depression 01/20/2014  . Encounter for other general counseling or advice on contraception 01/20/2014    History reviewed. No pertinent surgical history.   OB History   No obstetric history on file.     Family History  Problem Relation Age of Onset  . Diabetes Father   . Cancer Mother        breast  . Cancer Maternal Grandmother 2       breast CA    Social History   Tobacco Use  . Smoking status: Never Smoker  . Smokeless tobacco: Never Used  Substance Use Topics  . Alcohol use: No    Alcohol/week: 0.0 standard drinks  . Drug use: No    Home Medications Prior to Admission medications   Medication Sig Start Date End Date Taking? Authorizing Provider  diclofenac (CATAFLAM) 50 MG tablet Take 1 tablet (50 mg total) by mouth daily as needed (for headache). 11/20/17   Butch Penny, NP  norethindrone-ethinyl estradiol (JUNEL 1/20) 1-20 MG-MCG tablet Take 1 tablet  by mouth daily. 06/28/16   Dianne Dun, MD  oxyCODONE-acetaminophen (PERCOCET/ROXICET) 5-325 MG tablet Take 1 tablet by mouth every 6 (six) hours as needed for severe pain. 05/05/19   Cristina Gong, PA-C  propranolol (INDERAL) 20 MG tablet Take 1 tablet (20 mg total) by mouth 2 (two) times daily. 11/20/17   Butch Penny, NP  sertraline (ZOLOFT) 25 MG tablet TAKE 1 TABLET BY MOUTH DAILY 05/01/17   Dianne Dun, MD    Allergies    Patient has no known allergies.  Review of Systems   Review of Systems  Constitutional: Negative for chills and fever.  Respiratory: Negative for chest tightness and shortness of breath.   Musculoskeletal: Positive for joint swelling. Negative for neck pain.  Neurological: Negative for syncope and headaches.  All other systems reviewed and are negative.   Physical Exam Updated Vital Signs BP 133/85   Pulse (!) 103   Temp 98.9 F (37.2 C) (Oral) Comment: unable to obtain due to ongoing procedure  Resp 20   Ht 5\' 7"  (1.702 m)   Wt 110.2 kg   SpO2 97% Comment: room air  BMI 38.06 kg/m   Physical Exam Vitals and nursing note reviewed.  Constitutional:      General: She is not in acute distress.  Appearance: She is well-developed. She is not diaphoretic.  HENT:     Head: Normocephalic and atraumatic.  Eyes:     General: No scleral icterus.       Right eye: No discharge.        Left eye: No discharge.     Conjunctiva/sclera: Conjunctivae normal.  Cardiovascular:     Rate and Rhythm: Normal rate and regular rhythm.     Pulses: Normal pulses.     Comments: Left foot with 2+ DP pulse, unable to palpate PT pulse secondary to poor patient tolerance.  Capilary refills to toes on left foot is under 2 seconds. Pulmonary:     Effort: Pulmonary effort is normal. No respiratory distress.     Breath sounds: No stridor.  Abdominal:     General: There is no distension.  Musculoskeletal:        General: No deformity.     Cervical back: Normal  range of motion.     Comments: There is obvious deformity of the left ankle with the foot externally rotated.  She is able to move the toes on her left foot.  She has mild tenderness to palpation in the left proximal lower leg.   Skin:    General: Skin is warm and dry.  Neurological:     Mental Status: She is alert.     Sensory: No sensory deficit (Sensation intact to light touch to left ankle and foot.).     Motor: No abnormal muscle tone.  Psychiatric:        Behavior: Behavior normal.     ED Results / Procedures / Treatments   Labs (all labs ordered are listed, but only abnormal results are displayed) Labs Reviewed  HCG, SERUM, QUALITATIVE    EKG None  Radiology DG Tibia/Fibula Left  Result Date: 05/04/2019 CLINICAL DATA:  Treadmill injury, left lower leg deformity EXAM: LEFT TIBIA AND FIBULA - 2 VIEW COMPARISON:  None. FINDINGS: Frontal and lateral views of the left tibia and fibula are obtained. There is an oblique fracture of the distal fibular diaphysis with valgus angulation. There is lateral dislocation of the talus from the ankle mortise. Comminuted fracture is seen involving the posterolateral aspect of the tibial plafond. There is diffuse soft tissue edema surrounding the left ankle. The proximal tibia and fibula are unremarkable. The left knee is well aligned. IMPRESSION: 1. Fracture dislocation of the left ankle. Lateral dislocation of the talus, with fractures of the posterolateral aspect of the tibial plafond as well as the distal fibular diaphysis. Electronically Signed   By: Randa Ngo M.D.   On: 05/04/2019 21:55   DG Ankle 2 Views Left  Result Date: 05/04/2019 CLINICAL DATA:  Status post reduction EXAM: LEFT ANKLE - 2 VIEW COMPARISON:  Films from earlier in the same day. FINDINGS: The distal fibular and tibial fractures are again identified. The talus has been reduced. The fracture angulation has also been reduced. Splinting material is noted in place. IMPRESSION:  Status post reduction of prior fracture dislocation of the ankle. Electronically Signed   By: Inez Catalina M.D.   On: 05/04/2019 23:18   CT Ankle Left Wo Contrast  Result Date: 05/05/2019 CLINICAL DATA:  Fracture dislocation left ankle status post reduction, fell off treadmill EXAM: CT OF THE LEFT ANKLE WITHOUT CONTRAST TECHNIQUE: Multidetector CT imaging of the left ankle was performed according to the standard protocol. Multiplanar CT image reconstructions were also generated. COMPARISON:  05/04/2019 FINDINGS: Bones/Joint/Cartilage There is a comminuted fracture  of the distal fibular diaphysis, in near anatomic alignment. There is a comminuted fracture in the coronal plane through the posterior malleolus, minimally displaced. There is mild persistent lateral translation of the talus relative to the tibial plafond. No dislocation. Ligaments Suboptimally assessed by CT. Muscles and Tendons No evidence of muscular injury. Soft tissues Soft tissue swelling of the distal left lower leg and left ankle. No fluid collection or hematoma. IMPRESSION: 1. Near anatomic alignment of the comminuted distal left fibular diaphyseal fracture. 2. Near anatomic alignment of the comminuted posterior malleolar fracture. 3. Lateral translation of the talus relative to the tibial plafond. No dislocation. 4. Soft tissue edema. Electronically Signed   By: Sharlet Salina M.D.   On: 05/05/2019 00:03    Procedures .Ortho Injury Treatment Performed by: Cristina Gong, PA-C Authorized by: Cristina Gong, PA-C   Consent:    Consent obtained:  Written and verbal   Consent given by:  Patient   Risks discussed:  Fracture, irreducible dislocation, nerve damage, recurrent dislocation, restricted joint movement, stiffness and vascular damage   Alternatives discussed:  No treatment, immobilization, referral and delayed treatmentInjury location: lower leg Location details: left lower leg Injury type:  fracture-dislocation Pre-procedure neurovascular assessment: neurovascularly intact Pre-procedure distal perfusion: normal Pre-procedure neurological function: normal Pre-procedure range of motion: reduced  Anesthesia: Local anesthesia used: no  Patient sedated: Yes. Refer to sedation procedure documentation for details of sedation. Manipulation performed: yes Reduction successful: yes X-ray confirmed reduction: yes Immobilization: splint Splint type: short leg Supplies used: cotton padding,  Ortho-Glass and elastic bandage Post-procedure neurovascular assessment: post-procedure neurovascularly intact Post-procedure distal perfusion: normal Post-procedure neurological function: normal Post-procedure range of motion comment: Splinted    (including critical care time)  Medications Ordered in ED Medications  fentaNYL (SUBLIMAZE) 100 MCG/2ML injection (has no administration in time range)  oxyCODONE-acetaminophen (PERCOCET/ROXICET) 5-325 MG per tablet 1 tablet (has no administration in time range)  acetaminophen (TYLENOL) tablet 650 mg (has no administration in time range)  morphine 4 MG/ML injection 4 mg (4 mg Intravenous Given 05/04/19 2120)  ondansetron (ZOFRAN) injection 4 mg (4 mg Intravenous Given 05/04/19 2120)  etomidate (AMIDATE) injection 10 mg (10 mg Intravenous Given 05/04/19 2250)  etomidate (AMIDATE) injection (5 mg Intravenous Given 05/04/19 2254)  fentaNYL (SUBLIMAZE) injection (100 mcg Intravenous Given 05/04/19 2259)    ED Course  I have reviewed the triage vital signs and the nursing notes.  Pertinent labs & imaging results that were available during my care of the patient were reviewed by me and considered in my medical decision making (see chart for details).    MDM Rules/Calculators/A&P                     Patient is a 24 year old woman who presents today for evaluation of an isolated left lower leg injury after she fell off a treadmill.  On exam she has  obvious deformity of the left ankle.  Sensation intact.  Palpable pulses without evidence of vascular compromise.  X-rays were obtained showing a complex fracture dislocation of the left ankle. I spoke with Dr. Roda Shutters of orthopedics who recommended sedation reduction in the emergency room followed up with splint, postreduction films, CT scan of the ankle and outpatient follow-up. Sedation and reduction were performed, please see procedure notes. Patient tolerated sedation and reduction well. We discussed the importance of conservative care especially elevation.  She is given crutches and instructed to be nonweightbearing.  [facility] [place] PMP is consulted and she  is given a prescription for Percocet to help with pain control at home.  Work note is given. Recommended ibuprofen and Tylenol in addition for pain control.  Patient was able to eat and ambulate with crutches in the emergency room prior to discharge. She is given information on post sedation care and has someone who will be with her for the next 24 hours.  Return precautions were discussed with patient who states their understanding.  At the time of discharge patient denied any unaddressed complaints or concerns.  Patient is agreeable for discharge home.  Note: Portions of this report may have been transcribed using voice recognition software. Every effort was made to ensure accuracy; however, inadvertent computerized transcription errors may be present   Final Clinical Impression(s) / ED Diagnoses Final diagnoses:  Closed fracture of left ankle, initial encounter  Closed dislocation of left ankle, initial encounter  Fall, initial encounter    Rx / DC Orders ED Discharge Orders         Ordered    oxyCODONE-acetaminophen (PERCOCET/ROXICET) 5-325 MG tablet  Every 6 hours PRN     05/05/19 0028           Sundi, Slevin, PA-C 05/05/19 2306    Rolan Bucco, MD 05/09/19 0740

## 2019-05-04 NOTE — Sedation Documentation (Signed)
Pt awake & alert. Reports pain is well controlled.

## 2019-05-04 NOTE — ED Notes (Signed)
Imaging after left ankle reduction.

## 2019-05-04 NOTE — Sedation Documentation (Addendum)
Post reduction XR underway. VSS pt in NSR and alert.

## 2019-05-04 NOTE — ED Provider Notes (Signed)
.  Sedation  Date/Time: 05/04/2019 11:36 PM Performed by: Rolan Bucco, MD Authorized by: Rolan Bucco, MD   Consent:    Consent obtained:  Written   Consent given by:  Patient   Risks discussed:  Allergic reaction, inadequate sedation, vomiting, prolonged sedation necessitating reversal, prolonged hypoxia resulting in organ damage, respiratory compromise necessitating ventilatory assistance and intubation and nausea   Alternatives discussed:  Analgesia without sedation Universal protocol:    Procedure explained and questions answered to patient or proxy's satisfaction: yes     Relevant documents present and verified: yes     Test results available and properly labeled: yes     Imaging studies available: yes     Required blood products, implants, devices, and special equipment available: yes     Site/side marked: yes     Immediately prior to procedure a time out was called: yes     Patient identity confirmation method:  Arm band Indications:    Procedure performed:  Fracture reduction   Procedure necessitating sedation performed by:  Different physician Pre-sedation assessment:    Time since last food or drink:  4 hours   ASA classification: class 1 - normal, healthy patient     Neck mobility: normal     Mouth opening:  3 or more finger widths   Thyromental distance:  3 finger widths   Mallampati score:  II - soft palate, uvula, fauces visible   Pre-sedation assessments completed and reviewed: airway patency, cardiovascular function, hydration status, mental status, nausea/vomiting, pain level, respiratory function and temperature     Pre-sedation assessment completed:  05/04/2019 10:00 PM Immediate pre-procedure details:    Reassessment: Patient reassessed immediately prior to procedure     Reviewed: vital signs     Verified: bag valve mask available, emergency equipment available, intubation equipment available, IV patency confirmed, oxygen available and suction available    Procedure details (see MAR for exact dosages):    Preoxygenation:  Nasal cannula   Sedation:  Etomidate   Intended level of sedation: deep   Analgesia:  Fentanyl   Intra-procedure monitoring:  Blood pressure monitoring, cardiac monitor, continuous capnometry, continuous pulse oximetry, frequent LOC assessments and frequent vital sign checks   Intra-procedure events: none     Total Provider sedation time (minutes):  15 Post-procedure details:    Post-sedation assessment completed:  05/04/2019 11:37 PM   Attendance: Constant attendance by certified staff until patient recovered     Recovery: Patient returned to pre-procedure baseline     Post-sedation assessments completed and reviewed: airway patency, cardiovascular function, hydration status, mental status, nausea/vomiting, pain level and respiratory function     Patient is stable for discharge or admission: yes     Patient tolerance:  Tolerated well, no immediate complications      Rolan Bucco, MD 05/04/19 2338

## 2019-05-05 MED ORDER — ACETAMINOPHEN 325 MG PO TABS
650.0000 mg | ORAL_TABLET | Freq: Once | ORAL | Status: AC
  Administered 2019-05-05: 650 mg via ORAL
  Filled 2019-05-05: qty 2

## 2019-05-05 MED ORDER — OXYCODONE-ACETAMINOPHEN 5-325 MG PO TABS: 1.0000 | ORAL_TABLET | Freq: Four times a day (QID) | ORAL | 0 refills | Status: DC | PRN

## 2019-05-05 MED ORDER — OXYCODONE-ACETAMINOPHEN 5-325 MG PO TABS
1.0000 | ORAL_TABLET | Freq: Once | ORAL | Status: AC
  Administered 2019-05-05: 1 via ORAL
  Filled 2019-05-05: qty 1

## 2019-05-05 NOTE — ED Notes (Signed)
ED Provider at bedside. 

## 2019-05-05 NOTE — Discharge Instructions (Addendum)
It is very important that you elevate your leg.  Your foot needs to be elevated above your heart at all times when possible.  This will help significantly with the swelling which will also help with the heartbeat/throbbing type pain.   Please do not put any weight on your left leg.   Today you received medications that may make you sleepy or impair your ability to make decisions.  For the next 24 hours please do not drive, operate heavy machinery, care for a small child with out another adult present, or perform any activities that may cause harm to you or someone else if you were to fall asleep or be impaired.   You are being prescribed a medication which may make you sleepy. Please follow up of listed precautions for at least 24 hours after taking one dose.

## 2019-05-05 NOTE — ED Notes (Signed)
Per physician's order per procedural sedation patient placed on ETCO2 at 4L [place]. Patient tolerated well.

## 2019-05-07 ENCOUNTER — Ambulatory Visit: Payer: Self-pay

## 2019-05-07 ENCOUNTER — Inpatient Hospital Stay: Payer: Federal, State, Local not specified - PPO | Admitting: Physician Assistant

## 2019-05-07 ENCOUNTER — Ambulatory Visit (INDEPENDENT_AMBULATORY_CARE_PROVIDER_SITE_OTHER): Payer: Federal, State, Local not specified - PPO | Admitting: Orthopaedic Surgery

## 2019-05-07 ENCOUNTER — Encounter (HOSPITAL_BASED_OUTPATIENT_CLINIC_OR_DEPARTMENT_OTHER): Payer: Self-pay | Admitting: Orthopaedic Surgery

## 2019-05-07 ENCOUNTER — Encounter: Payer: Self-pay | Admitting: Physician Assistant

## 2019-05-07 ENCOUNTER — Other Ambulatory Visit: Payer: Self-pay

## 2019-05-07 DIAGNOSIS — M25572 Pain in left ankle and joints of left foot: Secondary | ICD-10-CM | POA: Diagnosis not present

## 2019-05-07 NOTE — Progress Notes (Signed)
Office Visit Note   Patient: Kylie Ware           Date of Birth: [birth year]           MRN: [id number] Visit Date: 05/07/2019              Requested by: Lucille Passy, MD No address on file PCP: Lucille Passy, MD   Assessment & Plan: Visit Diagnoses:  1. Pain in left ankle and joints of left foot     Plan: Impression is left bimalleolar ankle fracture and possible syndesmosis disruption.  After removing the splint and assessing the skin, a new splint was applied.  New x-rays were obtained which still did not exhibit anatomic alignment of the ankle joint.  We once again remove the splint and then applied a plaster splint with pressure.  Dr. Marlou Sa assisted with this.  Post splint x-rays were obtained which showed more of a reduction.  At this point, we have recommended surgical fixation for this type of fracture dislocation.  Risk, benefits and poss complications reviewed.  Rehab and recovery time discussed.  All questions were answered.  We have tentatively scheduled this for next Thursday.  Dr. Marlou Sa would like to have the patient come in Monday for repeat x-rays.  In the meantime, she will elevate as much as possible.  She will continue to be nonweightbearing utilizing crutches.  We will start her on a baby aspirin to take daily until 3 days preoperatively. Follow-Up Instructions: Return in about 5 days (around 05/12/2019) for post-op.   Orders:  Orders Placed This Encounter  Procedures  . XR Ankle Complete Left   No orders of the defined types were placed in this encounter.     Procedures: No procedures performed   Clinical Data: No additional findings.   Subjective: Chief Complaint  Patient presents with  . Left Ankle - Pain    HPI patient is a pleasant 24 year old female who comes in today following an injury to her left ankle.  Injury occurred on 05/04/2019, just 3 days ago.  She was on a treadmill that was not running when the belt rolled causing her to invert the  left ankle.  She was seen in the ED for this.  X-rays demonstrated a fracture dislocation.  She was reduced and placed in a short leg splint.  She comes in today for further evaluation treatment recommendation.  She has been nonweightbearing.  She has been elevating for pain and swelling.  She has been taking oxycodone as needed which does seem to help.  Of note, her brother does have a history of osteogenesis imperfecta.  Review of Systems as detailed in HPI.  All others reviewed and are negative.   Objective: Vital Signs: LMP 04/07/2019 (Exact Date)   Physical Exam well-developed well-nourished female no acute distress.  Alert and oriented x3.  Ortho Exam examination of the left ankle reveals significant swelling throughout.  She does have moderate ecchymosis to the medial aspect.  Her toes are warm and well-perfused.  She has full sensation distally.  Specialty Comments:  No specialty comments available.  Imaging: XR Ankle Complete Left  Result Date: 05/07/2019 X-rays following the plaster splint application show near full reduction of the dislocation.  She does exhibit distal fibula and tibia fractures.    PMFS History: Patient Active Problem List   Diagnosis Date Noted  . Migraine without aura and without status migrainosus, not intractable 07/22/2018  . Visit for routine gyn exam 02/08/2017  .  Anxiety and depression 01/20/2014  . Encounter for other general counseling or advice on contraception 01/20/2014   Past Medical History:  Diagnosis Date  . Anxiety   . Depression   . Migraines     Family History  Problem Relation Age of Onset  . Diabetes Father   . Cancer Mother        breast  . Cancer Maternal Grandmother 43       breast CA    Past Surgical History:  Procedure Laterality Date  . WISDOM TOOTH EXTRACTION     Social History   Occupational History  . Not on file  Tobacco Use  . Smoking status: Never Smoker  . Smokeless tobacco: Never Used  Substance  and Sexual Activity  . Alcohol use: No    Alcohol/week: 0.0 standard drinks  . Drug use: No  . Sexual activity: Not on file

## 2019-05-08 ENCOUNTER — Other Ambulatory Visit: Payer: Self-pay | Admitting: Physician Assistant

## 2019-05-08 ENCOUNTER — Telehealth: Payer: Self-pay | Admitting: Orthopaedic Surgery

## 2019-05-08 MED ORDER — OXYCODONE-ACETAMINOPHEN 5-325 MG PO TABS: 1.0000 | ORAL_TABLET | Freq: Four times a day (QID) | ORAL | 0 refills | Status: DC | PRN

## 2019-05-08 NOTE — Telephone Encounter (Signed)
Called into pharm  

## 2019-05-08 NOTE — Telephone Encounter (Signed)
Pt called in stating she only has 2 percocet's left and wanted to see if we could go ahead and call a refill in for her. Pt would like a call when this is done.   [phone]

## 2019-05-08 NOTE — Telephone Encounter (Signed)
Sent in

## 2019-05-08 NOTE — Telephone Encounter (Signed)
Patient aware.

## 2019-05-12 ENCOUNTER — Telehealth: Payer: Self-pay

## 2019-05-12 ENCOUNTER — Ambulatory Visit: Payer: Federal, State, Local not specified - PPO

## 2019-05-12 ENCOUNTER — Other Ambulatory Visit (HOSPITAL_COMMUNITY)
Admission: RE | Admit: 2019-05-12 | Discharge: 2019-05-12 | Disposition: A | Discharge status: Home / Self Care | Payer: Federal, State, Local not specified - PPO | Source: Ambulatory Visit | Attending: Orthopaedic Surgery | Admitting: Orthopaedic Surgery

## 2019-05-12 ENCOUNTER — Other Ambulatory Visit: Payer: Self-pay

## 2019-05-12 ENCOUNTER — Ambulatory Visit: Payer: Self-pay

## 2019-05-12 DIAGNOSIS — Z20822 Contact with and (suspected) exposure to covid-19: Secondary | ICD-10-CM | POA: Insufficient documentation

## 2019-05-12 DIAGNOSIS — Z01812 Encounter for preprocedural laboratory examination: Secondary | ICD-10-CM | POA: Diagnosis not present

## 2019-05-12 DIAGNOSIS — M25572 Pain in left ankle and joints of left foot: Secondary | ICD-10-CM

## 2019-05-12 LAB — SARS CORONAVIRUS 2 (TAT 6-24 HRS): SARS Coronavirus 2: NEGATIVE

## 2019-05-12 NOTE — Telephone Encounter (Signed)
Patient came into the office today for repeat xrays as you had requested. Can you please review and advise. Thanks.

## 2019-05-12 NOTE — Progress Notes (Signed)
Thanks for seeing her with Mardella Layman while I was gone.

## 2019-05-12 NOTE — Progress Notes (Signed)

## 2019-05-12 NOTE — Progress Notes (Signed)
Patient comes in today for repeat xrays per Dr. August Saucer. He will take a look at them this PM.

## 2019-05-12 NOTE — Telephone Encounter (Signed)
I called her and the x-rays look fine.  She should be good to go for surgery Thursday

## 2019-05-15 ENCOUNTER — Ambulatory Visit (HOSPITAL_COMMUNITY): Payer: Federal, State, Local not specified - PPO

## 2019-05-15 ENCOUNTER — Other Ambulatory Visit: Payer: Self-pay

## 2019-05-15 ENCOUNTER — Ambulatory Visit (HOSPITAL_BASED_OUTPATIENT_CLINIC_OR_DEPARTMENT_OTHER): Payer: Federal, State, Local not specified - PPO | Admitting: Anesthesiology

## 2019-05-15 ENCOUNTER — Ambulatory Visit (HOSPITAL_BASED_OUTPATIENT_CLINIC_OR_DEPARTMENT_OTHER)
Admission: RE | Admit: 2019-05-15 | Discharge: 2019-05-15 | Disposition: A | Discharge status: Home / Self Care | Payer: Federal, State, Local not specified - PPO | Attending: Orthopaedic Surgery | Admitting: Orthopaedic Surgery

## 2019-05-15 ENCOUNTER — Encounter (HOSPITAL_BASED_OUTPATIENT_CLINIC_OR_DEPARTMENT_OTHER)
Admission: RE | Disposition: A | Discharge status: Home / Self Care | Payer: Self-pay | Source: Home / Self Care | Attending: Orthopaedic Surgery

## 2019-05-15 ENCOUNTER — Encounter (HOSPITAL_BASED_OUTPATIENT_CLINIC_OR_DEPARTMENT_OTHER): Payer: Self-pay | Admitting: Orthopaedic Surgery

## 2019-05-15 DIAGNOSIS — Z6838 Body mass index (BMI) 38.0-38.9, adult: Secondary | ICD-10-CM | POA: Insufficient documentation

## 2019-05-15 DIAGNOSIS — E669 Obesity, unspecified: Secondary | ICD-10-CM | POA: Diagnosis not present

## 2019-05-15 DIAGNOSIS — F418 Other specified anxiety disorders: Secondary | ICD-10-CM | POA: Diagnosis not present

## 2019-05-15 DIAGNOSIS — S82832A Other fracture of upper and lower end of left fibula, initial encounter for closed fracture: Secondary | ICD-10-CM | POA: Diagnosis not present

## 2019-05-15 DIAGNOSIS — Z419 Encounter for procedure for purposes other than remedying health state, unspecified: Secondary | ICD-10-CM

## 2019-05-15 DIAGNOSIS — S82842A Displaced bimalleolar fracture of left lower leg, initial encounter for closed fracture: Secondary | ICD-10-CM

## 2019-05-15 DIAGNOSIS — G8918 Other acute postprocedural pain: Secondary | ICD-10-CM | POA: Diagnosis not present

## 2019-05-15 DIAGNOSIS — X58XXXA Exposure to other specified factors, initial encounter: Secondary | ICD-10-CM | POA: Diagnosis not present

## 2019-05-15 DIAGNOSIS — S93432A Sprain of tibiofibular ligament of left ankle, initial encounter: Secondary | ICD-10-CM

## 2019-05-15 DIAGNOSIS — G43009 Migraine without aura, not intractable, without status migrainosus: Secondary | ICD-10-CM | POA: Diagnosis not present

## 2019-05-15 HISTORY — DX: Displaced bimalleolar fracture of left lower leg, initial encounter for closed fracture: S82.842A

## 2019-05-15 HISTORY — DX: Dorsalgia, unspecified: M54.9

## 2019-05-15 HISTORY — PX: ORIF ANKLE FRACTURE: SHX5408

## 2019-05-15 SURGERY — OPEN REDUCTION INTERNAL FIXATION (ORIF) ANKLE FRACTURE
Anesthesia: Regional | Site: Ankle | Laterality: Left

## 2019-05-15 MED ORDER — ONDANSETRON HCL 4 MG PO TABS: 4.0000 mg | ORAL_TABLET | Freq: Three times a day (TID) | ORAL | 0 refills | Status: DC | PRN

## 2019-05-15 MED ORDER — DEXAMETHASONE SODIUM PHOSPHATE 10 MG/ML IJ SOLN
INTRAMUSCULAR | Status: DC | PRN
  Administered 2019-05-15: 5 mg via INTRAVENOUS

## 2019-05-15 MED ORDER — ONDANSETRON HCL 4 MG/2ML IJ SOLN
INTRAMUSCULAR | Status: DC | PRN
  Administered 2019-05-15: 4 mg via INTRAVENOUS

## 2019-05-15 MED ORDER — FENTANYL CITRATE (PF) 100 MCG/2ML IJ SOLN
INTRAMUSCULAR | Status: AC
  Filled 2019-05-15: qty 2

## 2019-05-15 MED ORDER — PROPOFOL 10 MG/ML IV BOLUS
INTRAVENOUS | Status: DC | PRN
  Administered 2019-05-15: 200 mg via INTRAVENOUS

## 2019-05-15 MED ORDER — LACTATED RINGERS IV SOLN: INTRAVENOUS | Status: DC

## 2019-05-15 MED ORDER — CEFAZOLIN SODIUM-DEXTROSE 2-4 GM/100ML-% IV SOLN
2.0000 g | INTRAVENOUS | Status: AC
  Administered 2019-05-15: 2 g via INTRAVENOUS

## 2019-05-15 MED ORDER — OXYCODONE HCL ER 10 MG PO T12A: 10.0000 mg | EXTENDED_RELEASE_TABLET | Freq: Two times a day (BID) | ORAL | 0 refills | Status: AC

## 2019-05-15 MED ORDER — FENTANYL CITRATE (PF) 100 MCG/2ML IJ SOLN
50.0000 ug | INTRAMUSCULAR | Status: DC | PRN
  Administered 2019-05-15: 50 ug via INTRAVENOUS

## 2019-05-15 MED ORDER — MIDAZOLAM HCL 2 MG/2ML IJ SOLN
1.0000 mg | INTRAMUSCULAR | Status: DC | PRN
  Administered 2019-05-15: 2 mg via INTRAVENOUS

## 2019-05-15 MED ORDER — ASPIRIN EC 81 MG PO TBEC: 81.0000 mg | DELAYED_RELEASE_TABLET | Freq: Every day | ORAL | 0 refills | Status: DC

## 2019-05-15 MED ORDER — OXYCODONE HCL 5 MG/5ML PO SOLN: 5.0000 mg | Freq: Once | ORAL | Status: DC | PRN

## 2019-05-15 MED ORDER — DEXAMETHASONE SODIUM PHOSPHATE 10 MG/ML IJ SOLN
INTRAMUSCULAR | Status: DC | PRN
  Administered 2019-05-15: 10 mg

## 2019-05-15 MED ORDER — CEFAZOLIN SODIUM-DEXTROSE 2-4 GM/100ML-% IV SOLN
INTRAVENOUS | Status: AC
  Filled 2019-05-15: qty 100

## 2019-05-15 MED ORDER — HYDROMORPHONE HCL 1 MG/ML IJ SOLN: 0.2500 mg | INTRAMUSCULAR | Status: DC | PRN

## 2019-05-15 MED ORDER — ROPIVACAINE HCL 5 MG/ML IJ SOLN
INTRAMUSCULAR | Status: DC | PRN
  Administered 2019-05-15: 40 mL via PERINEURAL

## 2019-05-15 MED ORDER — OXYCODONE HCL 5 MG PO TABS: 5.0000 mg | ORAL_TABLET | Freq: Three times a day (TID) | ORAL | 0 refills | Status: DC | PRN

## 2019-05-15 MED ORDER — MEPERIDINE HCL 25 MG/ML IJ SOLN: 6.2500 mg | INTRAMUSCULAR | Status: DC | PRN

## 2019-05-15 MED ORDER — PROMETHAZINE HCL 25 MG/ML IJ SOLN: 6.2500 mg | INTRAMUSCULAR | Status: DC | PRN

## 2019-05-15 MED ORDER — FENTANYL CITRATE (PF) 100 MCG/2ML IJ SOLN
INTRAMUSCULAR | Status: DC | PRN
  Administered 2019-05-15 (×2): 50 ug via INTRAVENOUS

## 2019-05-15 MED ORDER — EPHEDRINE SULFATE 50 MG/ML IJ SOLN
INTRAMUSCULAR | Status: DC | PRN
  Administered 2019-05-15: 5 mg via INTRAVENOUS
  Administered 2019-05-15: 10 mg via INTRAVENOUS
  Administered 2019-05-15: 5 mg via INTRAVENOUS

## 2019-05-15 MED ORDER — ACETAMINOPHEN 500 MG PO TABS: 1000.0000 mg | ORAL_TABLET | Freq: Once | ORAL | Status: DC

## 2019-05-15 MED ORDER — 0.9 % SODIUM CHLORIDE (POUR BTL) OPTIME
TOPICAL | Status: DC | PRN
  Administered 2019-05-15: 3000 mL

## 2019-05-15 MED ORDER — LIDOCAINE HCL (CARDIAC) PF 100 MG/5ML IV SOSY
PREFILLED_SYRINGE | INTRAVENOUS | Status: DC | PRN
  Administered 2019-05-15: 20 mg via INTRAVENOUS

## 2019-05-15 MED ORDER — ACETAMINOPHEN 500 MG PO TABS
ORAL_TABLET | ORAL | Status: AC
  Filled 2019-05-15: qty 2

## 2019-05-15 MED ORDER — MIDAZOLAM HCL 2 MG/2ML IJ SOLN
INTRAMUSCULAR | Status: AC
  Filled 2019-05-15: qty 2

## 2019-05-15 MED ORDER — KETOROLAC TROMETHAMINE 30 MG/ML IJ SOLN: 30.0000 mg | Freq: Once | INTRAMUSCULAR | Status: DC | PRN

## 2019-05-15 MED ORDER — SCOPOLAMINE 1 MG/3DAYS TD PT72
MEDICATED_PATCH | TRANSDERMAL | Status: AC
  Filled 2019-05-15: qty 1

## 2019-05-15 MED ORDER — SCOPOLAMINE 1 MG/3DAYS TD PT72
1.0000 | MEDICATED_PATCH | TRANSDERMAL | Status: DC
  Administered 2019-05-15: 1.5 mg via TRANSDERMAL

## 2019-05-15 MED ORDER — OXYCODONE HCL 5 MG PO TABS: 5.0000 mg | ORAL_TABLET | Freq: Once | ORAL | Status: DC | PRN

## 2019-05-15 SURGICAL SUPPLY — 92 items
BANDAGE ESMARK 6X9 LF (GAUZE/BANDAGES/DRESSINGS) ×1 IMPLANT
BIT DRILL 125X2.7XAO QCK (BIT) ×1 IMPLANT
BIT DRILL 2.7 (BIT) ×2
BIT DRL 125X2.7XAO QCK (BIT) ×1
BLADE HEX COATED 2.75 (ELECTRODE) ×2 IMPLANT
BLADE SURG 15 STRL LF DISP TIS (BLADE) ×2 IMPLANT
BLADE SURG 15 STRL SS (BLADE) ×4
BNDG CMPR 9X6 STRL LF SNTH (GAUZE/BANDAGES/DRESSINGS) ×1
BNDG COHESIVE 6X5 TAN STRL LF (GAUZE/BANDAGES/DRESSINGS) ×2 IMPLANT
BNDG ELASTIC 4X5.8 VLCR STR LF (GAUZE/BANDAGES/DRESSINGS) ×1 IMPLANT
BNDG ELASTIC 6X5.8 VLCR STR LF (GAUZE/BANDAGES/DRESSINGS) ×3 IMPLANT
BNDG ESMARK 6X9 LF (GAUZE/BANDAGES/DRESSINGS) ×2
BRUSH SCRUB EZ PLAIN DRY (MISCELLANEOUS) ×2 IMPLANT
CANISTER SUCT 1200ML W/VALVE (MISCELLANEOUS) ×2 IMPLANT
COVER BACK TABLE 60X90IN (DRAPES) ×2 IMPLANT
COVER MAYO STAND STRL (DRAPES) IMPLANT
COVER WAND RF STERILE (DRAPES) IMPLANT
CUFF TOURN SGL QUICK 34 (TOURNIQUET CUFF)
CUFF TRNQT CYL 34X4.125X (TOURNIQUET CUFF) IMPLANT
DECANTER SPIKE VIAL GLASS SM (MISCELLANEOUS) IMPLANT
DRAPE C-ARM 42X72 X-RAY (DRAPES) ×2 IMPLANT
DRAPE C-ARMOR (DRAPES) ×2 IMPLANT
DRAPE EXTREMITY T 121X128X90 (DISPOSABLE) ×2 IMPLANT
DRAPE IMP U-DRAPE 54X76 (DRAPES) ×2 IMPLANT
DRAPE INCISE IOBAN 66X45 STRL (DRAPES) IMPLANT
DRAPE SURG 17X23 STRL (DRAPES) ×4 IMPLANT
DRSG PAD ABDOMINAL 8X10 ST (GAUZE/BANDAGES/DRESSINGS) ×4 IMPLANT
DURAPREP 26ML APPLICATOR (WOUND CARE) ×4 IMPLANT
ELECT REM PT RETURN 9FT ADLT (ELECTROSURGICAL) ×2
ELECTRODE REM PT RTRN 9FT ADLT (ELECTROSURGICAL) ×1 IMPLANT
GAUZE SPONGE 4X4 12PLY STRL (GAUZE/BANDAGES/DRESSINGS) ×2 IMPLANT
GAUZE XEROFORM 1X8 LF (GAUZE/BANDAGES/DRESSINGS) ×2 IMPLANT
GLOVE BIOGEL PI IND STRL 6.5 (GLOVE) IMPLANT
GLOVE BIOGEL PI IND STRL 7.0 (GLOVE) ×2 IMPLANT
GLOVE BIOGEL PI INDICATOR 6.5 (GLOVE) ×1
GLOVE BIOGEL PI INDICATOR 7.0 (GLOVE) ×2
GLOVE ECLIPSE 6.5 STRL STRAW (GLOVE) ×1 IMPLANT
GLOVE ECLIPSE 7.0 STRL STRAW (GLOVE) ×2 IMPLANT
GLOVE ECLIPSE 7.5 STRL STRAW (GLOVE) ×1 IMPLANT
GLOVE SKINSENSE NS SZ7.5 (GLOVE) ×1
GLOVE SKINSENSE STRL SZ7.5 (GLOVE) ×1 IMPLANT
GLOVE SURG SYN 7.5  E (GLOVE) ×4
GLOVE SURG SYN 7.5 E (GLOVE) ×2 IMPLANT
GLOVE SURG SYN 7.5 PF PI (GLOVE) ×1 IMPLANT
GOWN STRL REIN XL XLG (GOWN DISPOSABLE) ×2 IMPLANT
GOWN STRL REUS W/ TWL LRG LVL3 (GOWN DISPOSABLE) ×1 IMPLANT
GOWN STRL REUS W/ TWL XL LVL3 (GOWN DISPOSABLE) ×1 IMPLANT
GOWN STRL REUS W/TWL LRG LVL3 (GOWN DISPOSABLE) ×2
GOWN STRL REUS W/TWL XL LVL3 (GOWN DISPOSABLE) ×2
IMPL TIGHTROP W/DRV K-LESS (Anchor) ×2 IMPLANT
IMPLANT TIGHTROPE W/DRV K-LESS (Anchor) ×4 IMPLANT
K-WIRE 1.6 (WIRE) ×4
K-WIRE FX150X1.6XTROC TIP (WIRE) ×2
KWIRE FX 150X1.6 TROC TIP (WIRE) IMPLANT
MANIFOLD NEPTUNE II (INSTRUMENTS) ×2 IMPLANT
NEEDLE HYPO 22GX1.5 SAFETY (NEEDLE) IMPLANT
NS IRRIG 1000ML POUR BTL (IV SOLUTION) ×2 IMPLANT
PACK BASIN DAY SURGERY FS (CUSTOM PROCEDURE TRAY) ×2 IMPLANT
PAD CAST 3X4 CTTN HI CHSV (CAST SUPPLIES) IMPLANT
PAD CAST 4YDX4 CTTN HI CHSV (CAST SUPPLIES) ×2 IMPLANT
PADDING CAST COTTON 3X4 STRL (CAST SUPPLIES)
PADDING CAST COTTON 4X4 STRL (CAST SUPPLIES) ×4
PADDING CAST COTTON 6X4 STRL (CAST SUPPLIES) IMPLANT
PADDING CAST SYN 6 (CAST SUPPLIES) ×1
PADDING CAST SYNTHETIC 4 (CAST SUPPLIES) ×5
PADDING CAST SYNTHETIC 4X4 STR (CAST SUPPLIES) ×1 IMPLANT
PADDING CAST SYNTHETIC 6X4 NS (CAST SUPPLIES) ×1 IMPLANT
PENCIL SMOKE EVACUATOR (MISCELLANEOUS) ×2 IMPLANT
PLATE TUB ANTHEM OT 10H 120 (Plate) ×1 IMPLANT
SCREW CORT 12X3.5XANKL SS (Screw) IMPLANT
SCREW CORTEX 3.5X12MM (Screw) ×10 IMPLANT
SCREW NONLOCK 3.5X10 (Screw) ×4 IMPLANT
SHEET MEDIUM DRAPE 40X70 STRL (DRAPES) ×2 IMPLANT
SLEEVE SCD COMPRESS KNEE MED (MISCELLANEOUS) ×2 IMPLANT
SPLINT FIBERGLASS 4X30 (CAST SUPPLIES) IMPLANT
SPONGE LAP 18X18 RF (DISPOSABLE) ×2 IMPLANT
SUCTION FRAZIER HANDLE 10FR (MISCELLANEOUS) ×2
SUCTION TUBE FRAZIER 10FR DISP (MISCELLANEOUS) ×1 IMPLANT
SUT ETHILON 3 0 PS 1 (SUTURE) IMPLANT
SUT VIC AB 0 CT1 27 (SUTURE) ×2
SUT VIC AB 0 CT1 27XBRD ANBCTR (SUTURE) IMPLANT
SUT VIC AB 2-0 CT1 27 (SUTURE) ×2
SUT VIC AB 2-0 CT1 TAPERPNT 27 (SUTURE) ×1 IMPLANT
SUT VIC AB 3-0 SH 27 (SUTURE)
SUT VIC AB 3-0 SH 27X BRD (SUTURE) IMPLANT
SYR BULB 3OZ (MISCELLANEOUS) ×2 IMPLANT
SYR CONTROL 10ML LL (SYRINGE) IMPLANT
TOWEL GREEN STERILE FF (TOWEL DISPOSABLE) ×2 IMPLANT
TRAY DSU PREP LF (CUSTOM PROCEDURE TRAY) ×2 IMPLANT
TUBE CONNECTING 20X1/4 (TUBING) ×2 IMPLANT
UNDERPAD 30X36 HEAVY ABSORB (UNDERPADS AND DIAPERS) ×2 IMPLANT
YANKAUER SUCT BULB TIP NO VENT (SUCTIONS) ×2 IMPLANT

## 2019-05-15 NOTE — Transfer of Care (Signed)
Immediate Anesthesia Transfer of Care Note  Patient: Kylie Ware  Procedure(s) Performed: OPEN REDUCTION INTERNAL FIXATION (ORIF) LEFT BIMALLEOLAR ANKLE AND SYNDESMOSIS (Left Ankle)  Patient Location: PACU  Anesthesia Type:GA combined with regional for post-op pain  Level of Consciousness: awake, alert , oriented and patient cooperative  Airway & Oxygen Therapy: Patient Spontanous Breathing and Patient connected to nasal cannula oxygen  Post-op Assessment: Report given to RN and Post -op Vital signs reviewed and stable  Post vital signs: Reviewed and stable  Last Vitals:  Vitals Value Taken Time  BP 111/64 05/15/19 1345  Temp    Pulse 96 05/15/19 1346  Resp 17 05/15/19 1346  SpO2 100 % 05/15/19 1346  Vitals shown include unvalidated device data.  Last Pain:  Vitals:   05/15/19 1059  PainSc: 0-No pain      Patients Stated Pain Goal: 5 (05/15/19 1059)  Complications: No apparent anesthesia complications

## 2019-05-15 NOTE — Progress Notes (Signed)
Assisted Dr. Finucane with left, ultrasound guided, popliteal, adductor canal block. Side rails up, monitors on throughout procedure. See vital signs in flow sheet. Tolerated Procedure well. 

## 2019-05-15 NOTE — Op Note (Signed)
Date of Surgery: 05/15/2019  INDICATIONS: Ms. Rief is a 24 y.o.-year-old female who sustained a left ankle fracture dislocation; she was indicated for open reduction and internal fixation due to the displaced nature of the articular fracture and came to the operating room today for this procedure. The patient did consent to the procedure after discussion of the risks and benefits.  PREOPERATIVE DIAGNOSIS:  1. Left bimalleolar ankle fracture dislocation 2. Left ankle syndesmosis disruption  POSTOPERATIVE DIAGNOSIS: Same.  PROCEDURE:  1. Open treatment of left ankle fracture with internal fixation.Bimalleolar CPT [id number] 2. Open treatment of left ankle syndesmosis rupture. CPT [phone]  SURGEON: N. Glee Arvin, M.D.  ASSIST: Starlyn Skeans [place], [place]; necessary for the timely completion of procedure and due to complexity of procedure.  ANESTHESIA:  general, block  TOURNIQUET TIME: less than 60 mins  IV FLUIDS AND URINE: See anesthesia.  ESTIMATED BLOOD LOSS: minimal mL.  IMPLANTS: Globus 10 hole semitubular plate, Arthrex tightrope x 2  COMPLICATIONS: see description of procedure.  DESCRIPTION OF PROCEDURE: The patient was brought to the operating room and placed supine on the operating table.  The patient had been signed prior to the procedure and this was documented. The patient had the anesthesia placed by the anesthesiologist.  A nonsterile tourniquet was placed on the upper thigh.  The prep verification and incision time-outs were performed to confirm that this was the correct patient, site, side and location. The patient had an SCD on the opposite lower extremity. The patient did receive antibiotics prior to the incision and was re-dosed during the procedure as needed at indicated intervals.  The patient had the lower extremity prepped and draped in the standard surgical fashion.  The extremity was exsanguinated using an esmarch bandage and the tourniquet was inflated to 300 mm Hg.    An incision was created on the lateral side of the ankle over the fibular shaft at the level of the fracture.  Dissection was carried down to the fascia which was then sharply incised in line with the incision.  The fibular shaft was then exposed retractors were placed.  The fracture was visualized and demonstrated comminution that was not amenable to like fixation.  I was able to restore length and alignment with a clamp and then I placed a semitubular plate on the lateral side of the fibula using fluoroscopic guidance.  Nonlocking screws were placed above and below the fracture bicortically using standard AO technique.  Each screw had excellent fixation.  The ankle was then stressed and the medial clear space was wide and there was an obvious disruption to the syndesmosis.  A large periarticular clamp was used to reduce the syndesmosis after making a small stab incision on the medial side of the ankle.  Fluoroscopic guidance was used to advance the provided drill across the distal fibula and tibia parallel to the ankle joint.  2 tight ropes were placed through the most distal holes of the plate.  The button was flipped on the medial side of the distal tibia and confirmed under fluoroscopy.  Final x-rays were taken.  The wounds were thoroughly irrigated and layered closure was performed.  Sterile dressings were applied.  Short leg splint was placed.  Patient tolerated the procedure well had no immediate complications.  POSTOPERATIVE PLAN: Ms. Straka will remain nonweightbearing on this leg for approximately 6 weeks; Ms. Panella will return for suture removal in 2 weeks.  He will be immobilized in a short leg splint and then transitioned  to a CAM walker at his first follow up appointment.  Ms. Prestia will receive DVT prophylaxis based on other medications, activity level, and risk ratio of bleeding to thrombosis.  Azucena Cecil, MD [facility] 5:06 PM

## 2019-05-15 NOTE — Anesthesia Preprocedure Evaluation (Addendum)
Anesthesia Evaluation  Patient identified by MRN, date of birth, ID band Patient awake    Reviewed: Allergy & Precautions, NPO status , Patient's Chart, lab work & pertinent test results  Airway Mallampati: III  TM Distance: >3 FB Neck ROM: Full    Dental no notable dental hx. (+) Teeth Intact, Dental Advisory Given   Pulmonary neg pulmonary ROS,    Pulmonary exam normal breath sounds clear to auscultation       Cardiovascular negative cardio ROS Normal cardiovascular exam Rhythm:Regular Rate:Normal     Neuro/Psych  Headaches, PSYCHIATRIC DISORDERS Anxiety Depression    GI/Hepatic negative GI ROS, Neg liver ROS,   Endo/Other  Obesity BMI 38  Renal/GU negative Renal ROS  negative genitourinary   Musculoskeletal Left bimalleolar ankle fracture, syndesmosis injury    Abdominal (+) + obese,   Peds negative pediatric ROS (+)  Hematology negative hematology ROS (+)   Anesthesia Other Findings   Reproductive/Obstetrics negative OB ROS                            Anesthesia Physical Anesthesia Plan  ASA: III  Anesthesia Plan: General and Regional   Post-op Pain Management: GA combined w/ Regional for post-op pain   Induction: Intravenous  PONV Risk Score and Plan: 3 and Ondansetron, Dexamethasone, Midazolam and Treatment may vary due to age or medical condition  Airway Management Planned: LMA  Additional Equipment: None  Intra-op Plan:   Post-operative Plan: Extubation in OR  Informed Consent: I have reviewed the patients History and Physical, chart, labs and discussed the procedure including the risks, benefits and alternatives for the proposed anesthesia with the patient or authorized representative who has indicated his/her understanding and acceptance.     Dental advisory given  Plan Discussed with: CRNA  Anesthesia Plan Comments:         Anesthesia Quick Evaluation

## 2019-05-15 NOTE — Anesthesia Procedure Notes (Signed)
Anesthesia Regional Block: Popliteal block   Pre-Anesthetic Checklist: ,, timeout performed, Correct Patient, Correct Site, Correct Laterality, Correct Procedure, Correct Position, site marked, Risks and benefits discussed,  Surgical consent,  Pre-op evaluation,  At surgeon's request and post-op pain management  Laterality: Left  Prep: Maximum Sterile Barrier Precautions used, chloraprep       Needles:  Injection technique: Single-shot  Needle Type: Echogenic Stimulator Needle     Needle Length: 9cm  Needle Gauge: 22     Additional Needles:   Procedures:,,,, ultrasound used (permanent image in chart),,,,  Narrative:  Start time: 05/15/2019 11:20 AM End time: 05/15/2019 11:25 AM Injection made incrementally with aspirations every 5 mL.  Performed by: Personally  Anesthesiologist: Lannie Fields, DO  Additional Notes: Monitors applied. No increased pain on injection. No increased resistance to injection. Injection made in 5cc increments. Good needle visualization. Patient tolerated procedure well.

## 2019-05-15 NOTE — Anesthesia Procedure Notes (Addendum)
Anesthesia Regional Block: Adductor canal block   Pre-Anesthetic Checklist: ,, timeout performed, Correct Patient, Correct Site, Correct Laterality, Correct Procedure, Correct Position, site marked, Risks and benefits discussed,  Surgical consent,  Pre-op evaluation,  At surgeon's request and post-op pain management  Laterality: Left  Prep: Maximum Sterile Barrier Precautions used, chloraprep       Needles:  Injection technique: Single-shot  Needle Type: Echogenic Stimulator Needle     Needle Length: 9cm  Needle Gauge: 22     Additional Needles:   Procedures:,,,, ultrasound used (permanent image in chart),,,,  Narrative:  Start time: 05/15/2019 11:25 AM End time: 05/15/2019 11:30 AM Injection made incrementally with aspirations every 5 mL.  Performed by: Personally  Anesthesiologist: Lannie Fields, DO  Additional Notes: Monitors applied. No increased pain on injection. No increased resistance to injection. Injection made in 5cc increments. Good needle visualization. Patient tolerated procedure well.

## 2019-05-15 NOTE — Anesthesia Postprocedure Evaluation (Signed)
Anesthesia Post Note  Patient: [name]  Procedure(s) Performed: OPEN REDUCTION INTERNAL FIXATION (ORIF) LEFT BIMALLEOLAR ANKLE AND SYNDESMOSIS (Left Ankle)     Patient location during evaluation: PACU Anesthesia Type: Regional and General Level of consciousness: awake and alert, oriented and patient cooperative Pain management: pain level controlled Vital Signs Assessment: post-procedure vital signs reviewed and stable Respiratory status: spontaneous breathing, nonlabored ventilation and respiratory function stable Cardiovascular status: blood pressure returned to baseline and stable Postop Assessment: no apparent nausea or vomiting Anesthetic complications: no    Last Vitals:  Vitals:   05/15/19 1415 05/15/19 1417  BP: 127/74   Pulse: 73 85  Resp: (!) 8 19  Temp:    SpO2: 99% 100%    Last Pain:  Vitals:   05/15/19 1415  PainSc: 0-No pain                 Tennis Must Chihiro Frey

## 2019-05-15 NOTE — Anesthesia Procedure Notes (Addendum)
Procedure Name: LMA Insertion Date/Time: 05/15/2019 11:57 AM Performed by: Yolonda Kida, CRNA Pre-anesthesia Checklist: Patient identified, Emergency Drugs available, Suction available and Patient being monitored Patient Re-evaluated:Patient Re-evaluated prior to induction Oxygen Delivery Method: Circle system utilized Preoxygenation: Pre-oxygenation with 100% oxygen Induction Type: IV induction LMA: LMA inserted LMA Size: 4.0 Number of attempts: 1 Placement Confirmation: positive ETCO2 and breath sounds checked- equal and bilateral Tube secured with: Tape Dental Injury: Teeth and Oropharynx as per pre-operative assessment

## 2019-05-15 NOTE — Discharge Instructions (Signed)
Regional Anesthesia Blocks  1. Numbness or the inability to move the "blocked" extremity may last from 3-48 hours after placement. The length of time depends on the medication injected and your individual response to the medication. If the numbness is not going away after 48 hours, call your surgeon.  2. The extremity that is blocked will need to be protected until the numbness is gone and the  Strength has returned. Because you cannot feel it, you will need to take extra care to avoid injury. Because it may be weak, you may have difficulty moving it or using it. You may not know what position it is in without looking at it while the block is in effect.  3. For blocks in the legs and feet, returning to weight bearing and walking needs to be done carefully. You will need to wait until the numbness is entirely gone and the strength has returned. You should be able to move your leg and foot normally before you try and bear weight or walk. You will need someone to be with you when you first try to ensure you do not fall and possibly risk injury.  4. Bruising and tenderness at the needle site are common side effects and will resolve in a few days.  5. Persistent numbness or new problems with movement should be communicated to the surgeon or the [facility] [phone] [facility] [phone]).  Tylenol given at 8:30 this morning. Next dose if needed after 2:30.   Post Anesthesia Home Care Instructions  Activity: Get plenty of rest for the remainder of the day. A responsible individual must stay with you for 24 hours following the procedure.  For the next 24 hours, DO NOT: -Drive a car -Advertising copywriter -Drink alcoholic beverages -Take any medication unless instructed by your physician -Make any legal decisions or sign important papers.  Meals: Start with liquid foods such as gelatin or soup. Progress to regular foods as tolerated. Avoid greasy, spicy, heavy foods.  If nausea and/or vomiting occur, drink only clear liquids until the nausea and/or vomiting subsides. Call your physician if vomiting continues.  Special Instructions/Symptoms: Your throat may feel dry or sore from the anesthesia or the breathing tube placed in your throat during surgery. If this causes discomfort, gargle with warm salt water. The discomfort should disappear within 24 hours.  If you had a scopolamine patch placed behind your ear for the management of post- operative nausea and/or vomiting:  1. The medication in the patch is effective for 72 hours, after which it should be removed.  Wrap patch in a tissue and discard in the trash. Wash hands thoroughly with soap and water. 2. You may remove the patch earlier than 72 hours if you experience unpleasant side effects which may include dry mouth, dizziness or visual disturbances. 3. Avoid touching the patch. Wash your hands with soap and water after contact with the patch.       1. Keep splint clean and dry 2. Elevate foot above level of the heart 3. Take aspirin to prevent blood clots 4. Take pain meds as needed 5. Strict non weight bearing to operative extremity

## 2019-05-15 NOTE — H&P (Signed)
PREOPERATIVE H&P  Chief Complaint: left bimalleolar ankle fracture, syndesmosis injury  HPI: Kylie Ware is a 24 y.o. female who presents for surgical treatment of left bimalleolar ankle fracture, syndesmosis injury.  She denies any changes in medical history.  Past Medical History:  Diagnosis Date  . Anxiety   . Depression   . Migraines    Past Surgical History:  Procedure Laterality Date  . WISDOM TOOTH EXTRACTION     Social History   Socioeconomic History  . Marital status: Single    Spouse name: Not on file  . Number of children: Not on file  . Years of education: Not on file  . Highest education level: Not on file  Occupational History  . Not on file  Tobacco Use  . Smoking status: Never Smoker  . Smokeless tobacco: Never Used  Substance and Sexual Activity  . Alcohol use: No    Alcohol/week: 0.0 standard drinks  . Drug use: No  . Sexual activity: Not on file  Other Topics Concern  . Not on file  Social History Narrative   Attending App   Sexually active   Non smoking.      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   Social Determinants of Health   Financial Resource Strain:   . Difficulty of Paying Living Expenses:   Food Insecurity:   . Worried About Programme researcher, broadcasting/film/video in the Last Year:   . Barista in the Last Year:   Transportation Needs:   . Freight forwarder (Medical):   Marland Kitchen Lack of Transportation (Non-Medical):   Physical Activity:   . Days of Exercise per Week:   . Minutes of Exercise per Session:   Stress:   . Feeling of Stress :   Social Connections:   . Frequency of Communication with Friends and Family:   . Frequency of Social Gatherings with Friends and Family:   . Attends Religious Services:   . Active Member of Clubs or Organizations:   . Attends Banker Meetings:   Marland Kitchen Marital Status:    Family History  Problem Relation Age of Onset  . Diabetes Father   . Cancer Mother        breast  . Cancer  Maternal Grandmother 27       breast CA   No Known Allergies Prior to Admission medications   Medication Sig Start Date End Date Taking? Authorizing Provider  acetaminophen (TYLENOL) 500 MG tablet Take 500 mg by mouth every 6 (six) hours as needed.   Yes [provider]  ibuprofen (ADVIL) 200 MG tablet Take 200 mg by mouth every 6 (six) hours as needed.   Yes [provider]  oxyCODONE-acetaminophen (PERCOCET/ROXICET) 5-325 MG tablet Take 1 tablet by mouth every 6 (six) hours as needed for severe pain. 05/08/19   Cristie Hem, PA-C     Positive ROS: All other systems have been reviewed and were otherwise negative with the exception of those mentioned in the HPI and as above.  Physical Exam: General: Alert, no acute distress Cardiovascular: No pedal edema Respiratory: No cyanosis, no use of accessory musculature GI: abdomen soft Skin: No lesions in the area of chief complaint Neurologic: Sensation intact distally Psychiatric: Patient is competent for consent with normal mood and affect Lymphatic: no lymphedema  MUSCULOSKELETAL: exam stable  Assessment: left bimalleolar ankle fracture, syndesmosis injury  Plan: Plan for Procedure(s): OPEN REDUCTION INTERNAL FIXATION (ORIF) LEFT BIMALLEOLAR ANKLE AND SYNDESMOSIS  The risks benefits and alternatives were discussed with the patient including but not limited to the risks of nonoperative treatment, versus surgical intervention including infection, bleeding, nerve injury,  blood clots, cardiopulmonary complications, morbidity, mortality, among others, and they were willing to proceed.   Eduard Roux, MD   05/15/2019 8:19 AM

## 2019-05-15 NOTE — Progress Notes (Signed)
Pt unable to void for urine sample. Pt states last menstrual period started 05/11/19 and denies possibility of being pregnant. Okay to proceed as planned per Dr. Salvadore Farber, Anesthesiologist.

## 2019-05-16 ENCOUNTER — Encounter: Payer: Self-pay | Admitting: *Deleted

## 2019-05-29 ENCOUNTER — Ambulatory Visit (INDEPENDENT_AMBULATORY_CARE_PROVIDER_SITE_OTHER): Payer: Federal, State, Local not specified - PPO | Admitting: Orthopaedic Surgery

## 2019-05-29 ENCOUNTER — Other Ambulatory Visit: Payer: Self-pay

## 2019-05-29 ENCOUNTER — Ambulatory Visit (INDEPENDENT_AMBULATORY_CARE_PROVIDER_SITE_OTHER): Payer: Federal, State, Local not specified - PPO

## 2019-05-29 ENCOUNTER — Encounter: Payer: Self-pay | Admitting: Orthopaedic Surgery

## 2019-05-29 DIAGNOSIS — M25572 Pain in left ankle and joints of left foot: Secondary | ICD-10-CM

## 2019-05-29 NOTE — Progress Notes (Signed)
   Post-Op Visit Note   Patient: Kylie Ware           Date of Birth: Sep 14, 1995           MRN: [id number] Visit Date: 05/29/2019 PCP: System, Pcp Not In   Assessment & Plan:  Chief Complaint:  Chief Complaint  Patient presents with  . Left Ankle - Pain   Visit Diagnoses:  1. Pain in left ankle and joints of left foot     Plan: Patient is a pleasant 24 year old girl who comes in today 2 weeks out ORIF left ankle bimalleolar fracture, dislocation and syndesmosis injury.  She has been doing well.  She has been compliant nonweightbearing in her short leg splint.  She has been taking ibuprofen for pain.  She does note tingling and burning to the entire left foot.  Examination of the left ankle reveals well-healing surgical incisions with out complication.  Calf soft nontender.  She does have moderate swelling throughout.  She has decreased sensation to the dorsum of the foot into the toes.  At this point, sutures were removed and Steri-Strips applied.  We will transition her into a cam walker nonweightbearing for the next 4 weeks.  She will ice and elevate for pain and swelling.  Follow-up with [place] in 4 weeks time for repeat evaluation three-view x-rays of the left ankle.  Call with concerns or questions in the meantime.  Follow-Up Instructions: Return in about 4 weeks (around 06/26/2019).   Orders:  Orders Placed This Encounter  Procedures  . XR Ankle Complete Left   No orders of the defined types were placed in this encounter.   Imaging: XR Ankle Complete Left  Result Date: 05/29/2019 Stable fixation of the left ankle fracture and syndesmosis without hardware complication   PMFS History: Patient Active Problem List   Diagnosis Date Noted  . Displaced bimalleolar fracture of left lower leg, initial encounter for closed fracture 05/15/2019  . Ankle syndesmosis disruption, left, initial encounter 05/15/2019  . Migraine without aura and without status migrainosus, not  intractable 07/22/2018  . Visit for routine gyn exam 02/08/2017  . Anxiety and depression 01/20/2014  . Encounter for other general counseling or advice on contraception 01/20/2014   Past Medical History:  Diagnosis Date  . Anxiety   . Back pain    pt states d/t "slipped disc"  . Depression   . Migraines     Family History  Problem Relation Age of Onset  . Diabetes Father   . Cancer Mother        breast  . Cancer Maternal Grandmother 59       breast CA    Past Surgical History:  Procedure Laterality Date  . ORIF ANKLE FRACTURE Left 05/15/2019   Procedure: OPEN REDUCTION INTERNAL FIXATION (ORIF) LEFT BIMALLEOLAR ANKLE AND SYNDESMOSIS;  Surgeon: Tarry Kos, MD;  Location: [facility];  Service: Orthopedics;  Laterality: Left;  . WISDOM TOOTH EXTRACTION     Social History   Occupational History  . Not on file  Tobacco Use  . Smoking status: Never Smoker  . Smokeless tobacco: Never Used  Substance and Sexual Activity  . Alcohol use: No    Alcohol/week: 0.0 standard drinks  . Drug use: No  . Sexual activity: Not on file

## 2019-05-30 ENCOUNTER — Encounter: Payer: Self-pay | Admitting: Orthopaedic Surgery

## 2019-05-30 NOTE — Telephone Encounter (Signed)
See message.

## 2019-05-30 NOTE — Telephone Encounter (Signed)
From what we discussed a few minutes ago, it sounds like the boot is the proper size.  I doubt her swelling has decreased so much in such a short period of time that, that is the reason it is not fitting.  Will you call and make sure her heel is in the proper place?  Have her just continue to ice and elevate over the weekend, and if still is not fitting properly next week, we can have her come in and check it.

## 2019-06-04 ENCOUNTER — Other Ambulatory Visit: Payer: Self-pay | Admitting: Orthopaedic Surgery

## 2019-06-04 NOTE — Telephone Encounter (Signed)
Please advise 

## 2019-06-04 NOTE — Telephone Encounter (Signed)
Can you refill until she is 6 weeks post-op

## 2019-06-05 NOTE — Telephone Encounter (Signed)
Refilled x 3 more weeks.

## 2019-06-26 ENCOUNTER — Encounter: Payer: Self-pay | Admitting: Orthopaedic Surgery

## 2019-06-26 ENCOUNTER — Other Ambulatory Visit: Payer: Self-pay

## 2019-06-26 ENCOUNTER — Ambulatory Visit (INDEPENDENT_AMBULATORY_CARE_PROVIDER_SITE_OTHER): Payer: Federal, State, Local not specified - PPO | Admitting: Orthopaedic Surgery

## 2019-06-26 ENCOUNTER — Ambulatory Visit (INDEPENDENT_AMBULATORY_CARE_PROVIDER_SITE_OTHER): Payer: Federal, State, Local not specified - PPO

## 2019-06-26 DIAGNOSIS — M25572 Pain in left ankle and joints of left foot: Secondary | ICD-10-CM

## 2019-06-26 DIAGNOSIS — S82842A Displaced bimalleolar fracture of left lower leg, initial encounter for closed fracture: Secondary | ICD-10-CM

## 2019-06-26 NOTE — Progress Notes (Signed)
   Post-Op Visit Note   Patient: Kylie Ware           Date of Birth: Aug 26, 1995           MRN: [id number] Visit Date: 06/26/2019 PCP: System, Pcp Not In   Assessment & Plan:  Chief Complaint:  Chief Complaint  Patient presents with  . Left Ankle - Follow-up   Visit Diagnoses:  1. Displaced bimalleolar fracture of left lower leg, initial encounter for closed fracture   2. Pain in left ankle and joints of left foot     Plan: Dezyre is 6 weeks status post ORIF left ankle fracture and syndesmosis.  She has no complaints.  No pain.  Just some numbness on the dorsal part of the foot.  Surgical scars are healed.  No signs of infection.  Minimal swelling.  X-rays are without complication.  At this point we will advance her to 25% partial weightbearing.  Internal PT referral was made today.  Follow-up in 4 weeks with repeat three-view x-rays of the left ankle.  Anticipate advancing to weightbearing as tolerated at that time.  Follow-Up Instructions: Return in about 4 weeks (around 07/24/2019).   Orders:  Orders Placed This Encounter  Procedures  . XR Ankle Complete Left  . Ambulatory referral to Physical Therapy   No orders of the defined types were placed in this encounter.   Imaging: XR Ankle Complete Left  Result Date: 06/26/2019 Stable fixation alignment of ankle fracture and syndesmosis.  No complications.   PMFS History: Patient Active Problem List   Diagnosis Date Noted  . Displaced bimalleolar fracture of left lower leg, initial encounter for closed fracture 05/15/2019  . Ankle syndesmosis disruption, left, initial encounter 05/15/2019  . Migraine without aura and without status migrainosus, not intractable 07/22/2018  . Visit for routine gyn exam 02/08/2017  . Anxiety and depression 01/20/2014  . Encounter for other general counseling or advice on contraception 01/20/2014   Past Medical History:  Diagnosis Date  . Anxiety   . Back pain    pt states d/t  "slipped disc"  . Depression   . Migraines     Family History  Problem Relation Age of Onset  . Diabetes Father   . Cancer Mother        breast  . Cancer Maternal Grandmother 22       breast CA    Past Surgical History:  Procedure Laterality Date  . ORIF ANKLE FRACTURE Left 05/15/2019   Procedure: OPEN REDUCTION INTERNAL FIXATION (ORIF) LEFT BIMALLEOLAR ANKLE AND SYNDESMOSIS;  Surgeon: Tarry Kos, MD;  Location: [facility];  Service: Orthopedics;  Laterality: Left;  . WISDOM TOOTH EXTRACTION     Social History   Occupational History  . Not on file  Tobacco Use  . Smoking status: Never Smoker  . Smokeless tobacco: Never Used  Substance and Sexual Activity  . Alcohol use: No    Alcohol/week: 0.0 standard drinks  . Drug use: No  . Sexual activity: Not on file

## 2019-07-01 ENCOUNTER — Encounter: Payer: Self-pay | Admitting: Physical Therapy

## 2019-07-01 ENCOUNTER — Ambulatory Visit: Payer: Federal, State, Local not specified - PPO | Admitting: Physical Therapy

## 2019-07-01 ENCOUNTER — Other Ambulatory Visit: Payer: Self-pay

## 2019-07-01 DIAGNOSIS — R293 Abnormal posture: Secondary | ICD-10-CM

## 2019-07-01 DIAGNOSIS — M25672 Stiffness of left ankle, not elsewhere classified: Secondary | ICD-10-CM

## 2019-07-01 DIAGNOSIS — R2681 Unsteadiness on feet: Secondary | ICD-10-CM

## 2019-07-01 DIAGNOSIS — R600 Localized edema: Secondary | ICD-10-CM

## 2019-07-01 DIAGNOSIS — R2689 Other abnormalities of gait and mobility: Secondary | ICD-10-CM

## 2019-07-01 DIAGNOSIS — M25572 Pain in left ankle and joints of left foot: Secondary | ICD-10-CM | POA: Diagnosis not present

## 2019-07-01 DIAGNOSIS — M6281 Muscle weakness (generalized): Secondary | ICD-10-CM

## 2019-07-01 DIAGNOSIS — Z9181 History of falling: Secondary | ICD-10-CM

## 2019-07-01 NOTE — Therapy (Signed)
[place], [place], [address] Phone: [phone]   Fax:  [phone]  Physical Therapy Evaluation  Patient Details  Name: Kylie Ware MRN: [id number] Date of Birth: [birth year] Referring Provider (PT): Frankey Shown, MD   Encounter Date: 07/01/2019  PT End of Session - 07/01/19 1524    Visit Number  1    Number of Visits  23    Date for PT Re-Evaluation  09/05/19    Authorization Type  $25 co-pay, ded $350 met, OOP max $5000, $719.13 remaining, 75 PT visit limit per year    PT Start Time  1345    PT Stop Time  1430    PT Time Calculation (min)  45 min    Equipment Utilized During Treatment  Gait belt    Activity Tolerance  Patient tolerated treatment well    Behavior During Therapy  WFL for tasks assessed/performed       Past Medical History:  Diagnosis Date  . Anxiety   . Back pain    pt states d/t "slipped disc"  . Depression   . Migraines     Past Surgical History:  Procedure Laterality Date  . ORIF ANKLE FRACTURE Left 05/15/2019   Procedure: OPEN REDUCTION INTERNAL FIXATION (ORIF) LEFT BIMALLEOLAR ANKLE AND SYNDESMOSIS;  Surgeon: Leandrew Koyanagi, MD;  Location: [facility];  Service: Orthopedics;  Laterality: Left;  . WISDOM TOOTH EXTRACTION      There were no vitals filed for this visit.   Subjective Assessment - 07/01/19 1349    Subjective  This 24yo female was referred to PT on 06/26/2019 by Frankey Shown, MD with pain in left ankle and joints of foot, 25% weight bearing crutch training, balance, HEP. She fell off a treadmill on 05/04/2019 with left ankle complex dislocation fracture.  Patient underwend ORIF surgery left bimalleolar ankle & syndesmosis on 05/15/2019.    Pertinent History  anxiety, depression, migraines, back pain    Limitations  Walking;Standing;House hold activities    Patient Stated Goals  to be walk again, full range & function in ankle back to where it was    Currently in  Pain?  Yes    Pain Score  4     Pain Location  Ankle    Pain Orientation  Left    Pain Descriptors / Indicators  Aching    Pain Type  Acute pain    Pain Onset  1 to 4 weeks ago    Pain Frequency  Intermittent    Aggravating Factors   bump it,    Pain Relieving Factors  ibuprofen & ice    Multiple Pain Sites  Yes    Pain Score  1   In last week, worst 3/10, best 1/10  prior to ankle fx was 0/10   Pain Location  Back    Pain Orientation  Lower;Mid    Pain Descriptors / Indicators  Stabbing    Pain Type  Chronic pain    Pain Radiating Towards  left leg to midthigh    Pain Onset  More than a month ago    Pain Frequency  Intermittent    Aggravating Factors   staying one position & lifting    Pain Relieving Factors  laying down, exercises         [facility] PT Assessment - 07/01/19 1345      Assessment   Medical Diagnosis  left ankle fracture with ORIF    Referring Provider (PT)  Frankey Shown, MD    Onset Date/Surgical Date  05/15/19   ORIF surgery   Hand Dominance  Right    Prior Therapy  none on ankle      Precautions   Precautions  Fall      Restrictions   Weight Bearing Restrictions  Yes    LLE Weight Bearing  Partial weight bearing    Other Position/Activity Restrictions  25%  (~241# prior to surgery)      Balance Screen   Has the patient fallen in the past 6 months  Yes    How many times?  1   off Treadmill with injury    Has the patient had a decrease in activity level because of a fear of falling?   Yes    Is the patient reluctant to leave their home because of a fear of falling?   No      Home Environment   Living Environment  Private residence    Living Arrangements  Parent;Non-relatives/Friends   was living with 2 friends, moved to parents temporarily   Type of [place]   her apt was 3rd floor    [place] to enter    Entrance Stairs-Number of Steps  3    [place]  Two level;1/2 bath on main level   all bedrooms  are upstairs   Alternate Level Stairs-Number of Steps  14    Alternate Level Stairs-Rails  Left    Home Equipment  Crutches;Shower seat;Wheelchair - manual      Prior Function   Level of Independence  Independent;Independent with household mobility without device;Independent with community mobility without device    Vocation  Full time employment    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#       Leisure  walks in parks [place], video games including wii, working out including treadmill & wt machines      Observation/Other Assessments-Edema    Edema  Circumferential      Circumferential Edema   Circumferential - Right  5" prox to med malleolus 12.25"  Just proximal to med malleolus 10"    Circumferential - Left   5" prox to med malleolus 13.25"  just prox to med malleolus 11.25"      ROM / Strength   AROM / PROM / Strength  AROM;PROM;Strength      AROM   Overall AROM   Deficits    Overall AROM Comments  right ankle DF 15*, PF 71*, inversion 32*, eversion 11*    Left Ankle Dorsiflexion  1    Left Ankle Plantar Flexion  29    Left Ankle Inversion  12    Left Ankle Eversion  1      PROM   Overall PROM   Deficits    Left Ankle Dorsiflexion  5    Left Ankle Plantar Flexion  31    Left Ankle Inversion  15    Left Ankle Eversion  2      Strength   Overall Strength  Deficits    Overall Strength Comments  left ankle & foot not tested due to WB restrictions but weakness present.   Bilateral hips 5/5     Right Knee Flexion  5/5    Right Knee Extension  5/5    Left Knee Flexion  4/5  Left Knee Extension  4/5      Transfers   Transfers  Sit to Stand;Stand to Sit    Sit to Stand  6: Modified independent (Device/Increase time);With upper extremity assist;From chair/3-in-1    Stand to Sit  6: Modified independent (Device/Increase time);With upper extremity assist;To chair/3-in-1      Ambulation/Gait    Ambulation/Gait  Yes    Ambulation/Gait Assistance  4: Min assist;6: Modified independent (Device/Increase time)   MinA w/crutches & Mod Ind with kneel walker   Ambulation/Gait Assistance Details  PT demo, verbal & manual cues on touchdown weightbearing with crutches.  She was unable to maintain contact LLE with ground on straight path.  She had balance losses requiring minA.      Ambulation Distance (Feet)  30 Feet   30' with crutches intiated PWB / TDWB, >200' kneel walker   Assistive device  Crutches;Other (Comment)   kneel walker   Ambulation Surface  Level;Indoor                  Objective measurements completed on examination: See above findings.                PT Short Term Goals - 07/01/19 2152      PT SHORT TERM GOAL #1   Title  Patient demonstrates & verbalizes understanding of initial HEP. (All STGs Target Date 08/01/2019)    Time  5    Period  Weeks    Status  New    Target Date  08/01/19      PT SHORT TERM GOAL #2   Title  Left ankle PROM dorsiflexion >10* & plantarflexion >40*    Time  5    Period  Weeks    Status  New    Target Date  08/01/19      PT SHORT TERM GOAL #3   Title  Patient ambulates 300' with crutches PWB modified independent.    Time  5    Period  Weeks    Status  New    Target Date  08/01/19      PT SHORT TERM GOAL #4   Title  Patient negotiates stairs with single rail & crutch, ramps & curbs with crutches PWB with supervision.    Time  5    Period  Weeks    Status  New    Target Date  08/01/19      PT SHORT TERM GOAL #5   Title  left ankle pain </= 3/10 with activities    Time  5    Period  Weeks    Status  New    Target Date  08/01/19        PT Long Term Goals - 07/01/19 2146      PT LONG TERM GOAL #1   Title  Patient verbalizes & demonstrates understanding of ongoing HEP / fitness plan. (All LTGs Target Date: 09/05/2019)    Time  10    Period  Weeks    Status  New    Target Date  09/05/19      PT  LONG TERM GOAL #2   Title  Left Ankle AROM Dorsiflexion 15*, Plantarflexion 60*    Time  10    Period  Weeks    Status  New    Target Date  09/05/19      PT LONG TERM GOAL #3   Title  Patient ambulates >500' modified independent LRAD with full WB or WBAT in  compliance with MD orders.    Time  10    Period  Weeks    Status  New    Target Date  09/05/19      PT LONG TERM GOAL #4   Title  Patient negotiates stairs single rail, ramps & curbs modified independent LRAD with full WB or WBAT in compliance with MD orders.    Time  10    Period  Weeks    Status  New    Target Date  09/05/19      PT LONG TERM GOAL #5   Title  Berg Balance >45/56 to indicate lower fall risk    Time  10    Period  Weeks    Status  New    Target Date  09/05/19      Additional Long Term Goals   Additional Long Term Goals  Yes      PT LONG TERM GOAL #6   Title  Patient reports left ankle pain </= 1/10 and low back pain at prior level or better.    Time  10    Period  Weeks    Status  New    Target Date  09/05/19             Plan - 07/01/19 2134    Clinical Impression Statement  This 24yo female fell off treadmill on 05/04/2019 fracturing her left ankle and underwent ORIF 05/15/2019. She has been nonWeight Bearing for 8 weeks since injury. MD orders for crutch training 25% weight bearing, balance & HEP. She has decreased range left ankle and weakness left ankle/foot & knee.  PT instructed in touchdown weight bearing with crutches but she was unable to maintain contact LLE with floor and balance losses with minimal assist.    Personal Factors and Comorbidities  Comorbidity 3+    Comorbidities  anxiety, depression, migraines, back pain    Examination-Activity Limitations  Locomotion Level;Squat;Stairs;Stand;Transfers    Examination-Participation Restrictions  Driving    Stability/Clinical Decision Making  Evolving/Moderate complexity    Clinical Decision Making  Low    Rehab Potential  Good    PT  Frequency  3x / week   3x/wk for 3 weeks, then 2x/wk for 7 weeks   PT Duration  Other (comment)   10 weeks   PT Treatment/Interventions  ADLs/Self Care Home Management;Cryotherapy;Electrical Stimulation;DME Instruction;Gait training;Stair training;Functional mobility training;Therapeutic activities;Therapeutic exercise;Balance training;Neuromuscular re-education;Patient/family education;Manual techniques;Scar mobilization;Passive range of motion;Dry needling;Vasopneumatic Device    PT Next Visit Plan  gait training with crutches start with toe touch and increase to 25% until next MD appt in 3 weeks,  instruct in HEP for ankle & foot/toes, vasopneumatic end of session for edema    Consulted and Agree with Plan of Care  Patient;Family member/caregiver    Family Member Consulted  father       Patient will benefit from skilled therapeutic intervention in order to improve the following deficits and impairments:  Abnormal gait, Decreased balance, Decreased endurance, Decreased mobility, Decreased range of motion, Decreased scar mobility, Decreased strength, Increased edema, Obesity, Pain  Visit Diagnosis: Other abnormalities of gait and mobility  Unsteadiness on feet  Abnormal posture  Pain in left ankle and joints of left foot  Stiffness of left ankle, not elsewhere classified  History of fall  Localized edema  Muscle weakness (generalized)     Problem List Patient Active Problem List   Diagnosis Date Noted  . Displaced bimalleolar fracture of left lower leg, initial encounter  for closed fracture 05/15/2019  . Ankle syndesmosis disruption, left, initial encounter 05/15/2019  . Migraine without aura and without status migrainosus, not intractable 07/22/2018  . Visit for routine gyn exam 02/08/2017  . Anxiety and depression 01/20/2014  . Encounter for other general counseling or advice on contraception 01/20/2014    Jamey Reas PT, DPT 07/01/2019, 9:59 PM  [facility] [address] [place], [place], [address] Phone: [phone]   Fax:  [phone]  Name: REONA ZENDEJAS MRN: [id number] Date of Birth: [birth year]

## 2019-07-02 ENCOUNTER — Encounter: Payer: Self-pay | Admitting: Rehabilitative and Restorative Service Providers"

## 2019-07-02 ENCOUNTER — Ambulatory Visit: Payer: Federal, State, Local not specified - PPO | Admitting: Rehabilitative and Restorative Service Providers"

## 2019-07-02 DIAGNOSIS — M25672 Stiffness of left ankle, not elsewhere classified: Secondary | ICD-10-CM | POA: Diagnosis not present

## 2019-07-02 DIAGNOSIS — R2689 Other abnormalities of gait and mobility: Secondary | ICD-10-CM

## 2019-07-02 DIAGNOSIS — R600 Localized edema: Secondary | ICD-10-CM | POA: Diagnosis not present

## 2019-07-02 DIAGNOSIS — M6281 Muscle weakness (generalized): Secondary | ICD-10-CM

## 2019-07-02 DIAGNOSIS — M25572 Pain in left ankle and joints of left foot: Secondary | ICD-10-CM | POA: Diagnosis not present

## 2019-07-02 DIAGNOSIS — R2681 Unsteadiness on feet: Secondary | ICD-10-CM

## 2019-07-02 NOTE — Therapy (Addendum)
[facility] [place], [place], [address] Phone: [phone]   Fax:  [phone]  Physical Therapy Treatment  Patient Details  Name: Kylie Ware MRN: [id number] Date of Birth: 07-18-1995 Referring Provider (PT): Frankey Shown, MD   Encounter Date: 07/02/2019  PT End of Session - 07/02/19 1628    Visit Number  2    Number of Visits  23    Date for PT Re-Evaluation  09/05/19    Authorization Type  $25 co-pay, ded $350 met, OOP max $5000, $719.13 remaining, 75 PT visit limit per year    PT Start Time  1610    PT Stop Time  1645    PT Time Calculation (min)  35 min    Equipment Utilized During Treatment  Gait belt    Activity Tolerance  Patient tolerated treatment well    Behavior During Therapy  WFL for tasks assessed/performed       Past Medical History:  Diagnosis Date  . Anxiety   . Back pain    pt states d/t "slipped disc"  . Depression   . Migraines     Past Surgical History:  Procedure Laterality Date  . ORIF ANKLE FRACTURE Left 05/15/2019   Procedure: OPEN REDUCTION INTERNAL FIXATION (ORIF) LEFT BIMALLEOLAR ANKLE AND SYNDESMOSIS;  Surgeon: Leandrew Koyanagi, MD;  Location: [place];  Service: Orthopedics;  Laterality: Left;  . WISDOM TOOTH EXTRACTION      There were no vitals filed for this visit.  Subjective Assessment - 07/02/19 1617    Subjective  This 24yo female was referred to PT on 06/26/2019 by Frankey Shown, MD with pain in left ankle and joints of foot, 25% weight bearing crutch training, balance, HEP. She fell off a treadmill on 05/04/2019 with left ankle complex dislocation fracture.  Patient underwend ORIF surgery left bimalleolar ankle & syndesmosis on 05/15/2019.    Pertinent History  anxiety, depression, migraines, back pain    Limitations  Walking;Standing;House hold activities    Patient Stated Goals  to be walk again, full range & function in ankle back to where it was    Pain Onset  1 to  4 weeks ago    Pain Onset  More than a month ago                       07/02/19 1617  Symptoms/Limitations  Subjective Pt. stated no pain complaints today upon arrival.  Pertinent History anxiety, depression, migraines, back pain  Limitations Walking;Standing;House hold activities  Patient Stated Goals to be walk again, full range & function in ankle back to where it was  Pain Assessment  Currently in Pain? No/denies  Pain Score 0  Pain Location Ankle  Pain Onset 1 to 4 weeks ago  2nd Pain Site  Pain Score 0  Pain Onset More than a month ago       [facility] Adult PT Treatment/Exercise - 07/02/19 0001      Exercises   Exercises  Ankle      Modalities   Modalities  Vasopneumatic      Vasopneumatic   Number Minutes Vasopneumatic   10 minutes    Vasopnuematic Location   Ankle    Vasopneumatic Pressure  Low    Vasopneumatic Temperature   34      Manual Therapy   Manual therapy comments  g3 ap mobs, subtalar medial/lateral mobs to Lt ankle      Ankle Exercises: Seated  ABC's  3 reps    Other Seated Ankle Exercises  df/pf, inversion/eversion 20 x each way Lt ankle    Other Seated Ankle Exercises  DF stretch c strap 30 sec x 5             PT Education - 07/02/19 1627    Education Details  HEP, POC    Person(s) Educated  Patient    Methods  Explanation;Demonstration;Handout    Comprehension  Verbalized understanding;Returned demonstration       PT Short Term Goals - 07/01/19 2152      PT SHORT TERM GOAL #1   Title  Patient demonstrates & verbalizes understanding of initial HEP. (All STGs Target Date 08/01/2019)    Time  5    Period  Weeks    Status  New    Target Date  08/01/19      PT SHORT TERM GOAL #2   Title  Left ankle PROM dorsiflexion >10* & plantarflexion >40*    Time  5    Period  Weeks    Status  New    Target Date  08/01/19      PT SHORT TERM GOAL #3   Title  Patient ambulates 300' with crutches PWB modified independent.     Time  5    Period  Weeks    Status  New    Target Date  08/01/19      PT SHORT TERM GOAL #4   Title  Patient negotiates stairs with single rail & crutch, ramps & curbs with crutches PWB with supervision.    Time  5    Period  Weeks    Status  New    Target Date  08/01/19      PT SHORT TERM GOAL #5   Title  left ankle pain </= 3/10 with activities    Time  5    Period  Weeks    Status  New    Target Date  08/01/19        PT Long Term Goals - 07/01/19 2146      PT LONG TERM GOAL #1   Title  Patient verbalizes & demonstrates understanding of ongoing HEP / fitness plan. (All LTGs Target Date: 09/05/2019)    Time  10    Period  Weeks    Status  New    Target Date  09/05/19      PT LONG TERM GOAL #2   Title  Left Ankle AROM Dorsiflexion 15*, Plantarflexion 60*    Time  10    Period  Weeks    Status  New    Target Date  09/05/19      PT LONG TERM GOAL #3   Title  Patient ambulates >500' modified independent LRAD with full WB or WBAT in compliance with MD orders.    Time  10    Period  Weeks    Status  New    Target Date  09/05/19      PT LONG TERM GOAL #4   Title  Patient negotiates stairs single rail, ramps & curbs modified independent LRAD with full WB or WBAT in compliance with MD orders.    Time  10    Period  Weeks    Status  New    Target Date  09/05/19      PT LONG TERM GOAL #5   Title  Berg Balance >45/56 to indicate lower fall risk    Time  10    Period  Weeks    Status  New    Target Date  09/05/19      Additional Long Term Goals   Additional Long Term Goals  Yes      PT LONG TERM GOAL #6   Title  Patient reports left ankle pain </= 1/10 and low back pain at prior level or better.    Time  10    Period  Weeks    Status  New    Target Date  09/05/19            Plan - 07/02/19 1637    Clinical Impression Statement  Treatment limited today due to arrival late for appointment.  Talocrural jt restriction as well as posterior calf muscle  restriction for DF at this time.  Continued emphasis on mobility gains prior to increased WB activity as MD allows.    Personal Factors and Comorbidities  Comorbidity 3+    Comorbidities  anxiety, depression, migraines, back pain    Examination-Activity Limitations  Locomotion Level;Squat;Stairs;Stand;Transfers    Examination-Participation Restrictions  Driving    Stability/Clinical Decision Making  Evolving/Moderate complexity    Rehab Potential  Good    PT Frequency  3x / week   3x/wk for 3 weeks, then 2x/wk for 7 weeks   PT Duration  Other (comment)   10 weeks   PT Treatment/Interventions  ADLs/Self Care Home Management;Cryotherapy;Electrical Stimulation;DME Instruction;Gait training;Stair training;Functional mobility training;Therapeutic activities;Therapeutic exercise;Balance training;Neuromuscular re-education;Patient/family education;Manual techniques;Scar mobilization;Passive range of motion;Dry needling;Vasopneumatic Device    PT Next Visit Plan  Continue mobility intervention, encourage crutch usage    PT Home Exercise [place] and Agree with Plan of Care  Patient;Family member/caregiver    Family Member Consulted  --       Patient will benefit from skilled therapeutic intervention in order to improve the following deficits and impairments:  Abnormal gait, Decreased balance, Decreased endurance, Decreased mobility, Decreased range of motion, Decreased scar mobility, Decreased strength, Increased edema, Obesity, Pain  Visit Diagnosis: Pain in left ankle and joints of left foot  Stiffness of left ankle, not elsewhere classified  Muscle weakness (generalized)  Localized edema  Other abnormalities of gait and mobility  Unsteadiness on feet     Problem List Patient Active Problem List   Diagnosis Date Noted  . Displaced bimalleolar fracture of left lower leg, initial encounter for closed fracture 05/15/2019  . Ankle syndesmosis disruption, left,  initial encounter 05/15/2019  . Migraine without aura and without status migrainosus, not intractable 07/22/2018  . Visit for routine gyn exam 02/08/2017  . Anxiety and depression 01/20/2014  . Encounter for other general counseling or advice on contraception 01/20/2014   Scot Jun, PT, DPT, OCS, ATC 07/02/19  4:39 PM   Scot Jun, PT, DPT, OCS, ATC 07/03/19  2:24 PM       [facility] [address] [place], [place], [address] Phone: [phone]   Fax:  [phone]  Name: REBEKKAH POWLESS MRN: [id number] Date of Birth: Jul 19, 1995

## 2019-07-02 NOTE — Patient Instructions (Signed)
Access Code: [email or web address] URL: [email or web address] Date: 07/02/2019 Prepared by: Chyrel Masson  Exercises Ankle Pumps in Elevation - 2 x daily - 7 x weekly - 3 sets - 10 reps Supine Ankle Inversion Eversion AROM - 2 x daily - 7 x weekly - 3 sets - 10 reps Ankle Alphabet in Elevation - 2 x daily - 7 x weekly - 1 sets - 3 reps Long Sitting Calf Stretch with Strap - 2 x daily - 7 x weekly - 1 sets - 5 reps - 30 hold

## 2019-07-03 ENCOUNTER — Other Ambulatory Visit: Payer: Self-pay

## 2019-07-03 ENCOUNTER — Encounter: Payer: Self-pay | Admitting: Rehabilitative and Restorative Service Providers"

## 2019-07-03 ENCOUNTER — Ambulatory Visit: Payer: Federal, State, Local not specified - PPO | Admitting: Rehabilitative and Restorative Service Providers"

## 2019-07-03 DIAGNOSIS — M25672 Stiffness of left ankle, not elsewhere classified: Secondary | ICD-10-CM | POA: Diagnosis not present

## 2019-07-03 DIAGNOSIS — R600 Localized edema: Secondary | ICD-10-CM

## 2019-07-03 DIAGNOSIS — M6281 Muscle weakness (generalized): Secondary | ICD-10-CM | POA: Diagnosis not present

## 2019-07-03 DIAGNOSIS — M25572 Pain in left ankle and joints of left foot: Secondary | ICD-10-CM | POA: Diagnosis not present

## 2019-07-03 DIAGNOSIS — R2681 Unsteadiness on feet: Secondary | ICD-10-CM

## 2019-07-03 DIAGNOSIS — R2689 Other abnormalities of gait and mobility: Secondary | ICD-10-CM

## 2019-07-03 NOTE — Therapy (Signed)
[facility] [place], [place], [address] Phone: [phone]   Fax:  [phone]  Physical Therapy Treatment  Patient Details  Name: Kylie Ware MRN: [id number] Date of Birth: 08/01/1995 Referring Provider (PT): Frankey Shown, MD   Encounter Date: 07/03/2019  PT End of Session - 07/03/19 1113    Visit Number  3    Number of Visits  23    Date for PT Re-Evaluation  09/05/19    Authorization Type  $25 co-pay, ded $350 met, OOP max $5000, $719.13 remaining, 75 PT visit limit per year    PT Start Time  1100    PT Stop Time  1145    PT Time Calculation (min)  45 min    Activity Tolerance  Patient tolerated treatment well    Behavior During Therapy  [facility] for tasks assessed/performed       Past Medical History:  Diagnosis Date  . Anxiety   . Back pain    pt states d/t "slipped disc"  . Depression   . Migraines     Past Surgical History:  Procedure Laterality Date  . ORIF ANKLE FRACTURE Left 05/15/2019   Procedure: OPEN REDUCTION INTERNAL FIXATION (ORIF) LEFT BIMALLEOLAR ANKLE AND SYNDESMOSIS;  Surgeon: Leandrew Koyanagi, MD;  Location: [place];  Service: Orthopedics;  Laterality: Left;  . WISDOM TOOTH EXTRACTION      There were no vitals filed for this visit.  Subjective Assessment - 07/03/19 1104    Subjective  Pt. stated no particular pain reports today.    Pertinent History  anxiety, depression, migraines, back pain    Limitations  Walking;Standing;House hold activities    Patient Stated Goals  to be walk again, full range & function in ankle back to where it was    Currently in Pain?  No/denies    Pain Score  0-No pain    Pain Location  Ankle    Pain Orientation  Left    Pain Onset  1 to 4 weeks ago    Aggravating Factors   no complaint    Pain Score  0    Pain Onset  More than a month ago                        [facility] Adult PT Treatment/Exercise - 07/03/19 0001      Vasopneumatic    Number Minutes Vasopneumatic   7 minutes    Vasopnuematic Location   Ankle    Vasopneumatic Pressure  Medium    Vasopneumatic Temperature   34      Manual Therapy   Manual therapy comments  g3 ap mobs, subtalar medial/lateral mobs to Lt ankle      Ankle Exercises: Seated   BAPS  Level 2;Sitting   30 x fwd/back, side to side, circles cw, ccw each   Other Seated Ankle Exercises  seated DF, pf, inv, ev green band 3 x 10 each way    Other Seated Ankle Exercises  DF c strap 15 sec x 5 Lt LE               PT Short Term Goals - 07/01/19 2152      PT SHORT TERM GOAL #1   Title  Patient demonstrates & verbalizes understanding of initial HEP. (All STGs Target Date 08/01/2019)    Time  5    Period  Weeks    Status  New    Target  Date  08/01/19      PT SHORT TERM GOAL #2   Title  Left ankle PROM dorsiflexion >10* & plantarflexion >40*    Time  5    Period  Weeks    Status  New    Target Date  08/01/19      PT SHORT TERM GOAL #3   Title  Patient ambulates 300' with crutches PWB modified independent.    Time  5    Period  Weeks    Status  New    Target Date  08/01/19      PT SHORT TERM GOAL #4   Title  Patient negotiates stairs with single rail & crutch, ramps & curbs with crutches PWB with supervision.    Time  5    Period  Weeks    Status  New    Target Date  08/01/19      PT SHORT TERM GOAL #5   Title  left ankle pain </= 3/10 with activities    Time  5    Period  Weeks    Status  New    Target Date  08/01/19        PT Long Term Goals - 07/01/19 2146      PT LONG TERM GOAL #1   Title  Patient verbalizes & demonstrates understanding of ongoing HEP / fitness plan. (All LTGs Target Date: 09/05/2019)    Time  10    Period  Weeks    Status  New    Target Date  09/05/19      PT LONG TERM GOAL #2   Title  Left Ankle AROM Dorsiflexion 15*, Plantarflexion 60*    Time  10    Period  Weeks    Status  New    Target Date  09/05/19      PT LONG TERM GOAL #3    Title  Patient ambulates >500' modified independent LRAD with full WB or WBAT in compliance with MD orders.    Time  10    Period  Weeks    Status  New    Target Date  09/05/19      PT LONG TERM GOAL #4   Title  Patient negotiates stairs single rail, ramps & curbs modified independent LRAD with full WB or WBAT in compliance with MD orders.    Time  10    Period  Weeks    Status  New    Target Date  09/05/19      PT LONG TERM GOAL #5   Title  Berg Balance >45/56 to indicate lower fall risk    Time  10    Period  Weeks    Status  New    Target Date  09/05/19      Additional Long Term Goals   Additional Long Term Goals  Yes      PT LONG TERM GOAL #6   Title  Patient reports left ankle pain </= 1/10 and low back pain at prior level or better.    Time  10    Period  Weeks    Status  New    Target Date  09/05/19            Plan - 07/03/19 1117    Clinical Impression Statement  Conitnued good tolerance to active mobiity interention in NWB at this time.  Progressed today to include strengthening of AROM in NWB.  Continued treatment indicated but reduced frequency at  this time.    Personal Factors and Comorbidities  Comorbidity 3+    Comorbidities  anxiety, depression, migraines, back pain    Examination-Activity Limitations  Locomotion Level;Squat;Stairs;Stand;Transfers    Examination-Participation Restrictions  Driving    Stability/Clinical Decision Making  Evolving/Moderate complexity    Rehab Potential  Good    PT Frequency  --   2-3x/week   PT Duration  Other (comment)   10 weeks   PT Treatment/Interventions  ADLs/Self Care Home Management;Cryotherapy;Electrical Stimulation;DME Instruction;Gait training;Stair training;Functional mobility training;Therapeutic activities;Therapeutic exercise;Balance training;Neuromuscular re-education;Patient/family education;Manual techniques;Scar mobilization;Passive range of motion;Dry needling;Vasopneumatic Device    PT Next Visit Plan   Continue progress of mobility and strength NWB    PT Home Exercise Plan  [email or web address]    Consulted and Agree with Plan of Care  Patient       Patient will benefit from skilled therapeutic intervention in order to improve the following deficits and impairments:  Abnormal gait, Decreased balance, Decreased endurance, Decreased mobility, Decreased range of motion, Decreased scar mobility, Decreased strength, Increased edema, Obesity, Pain  Visit Diagnosis: Pain in left ankle and joints of left foot  Stiffness of left ankle, not elsewhere classified  Muscle weakness (generalized)  Localized edema  Other abnormalities of gait and mobility  Unsteadiness on feet     Problem List Patient Active Problem List   Diagnosis Date Noted  . Displaced bimalleolar fracture of left lower leg, initial encounter for closed fracture 05/15/2019  . Ankle syndesmosis disruption, left, initial encounter 05/15/2019  . Migraine without aura and without status migrainosus, not intractable 07/22/2018  . Visit for routine gyn exam 02/08/2017  . Anxiety and depression 01/20/2014  . Encounter for other general counseling or advice on contraception 01/20/2014   Scot Jun, PT, DPT, OCS, ATC 07/03/19  1:54 PM    [facility] [address] [place], [place], [address] Phone: [phone]   Fax:  [phone]  Name: Kylie Ware MRN: [id number] Date of Birth: 04-08-1995

## 2019-07-08 ENCOUNTER — Other Ambulatory Visit: Payer: Self-pay

## 2019-07-08 ENCOUNTER — Encounter: Payer: Self-pay | Admitting: Physical Therapy

## 2019-07-08 ENCOUNTER — Ambulatory Visit: Payer: Federal, State, Local not specified - PPO | Admitting: Physical Therapy

## 2019-07-08 DIAGNOSIS — Z9181 History of falling: Secondary | ICD-10-CM

## 2019-07-08 DIAGNOSIS — R600 Localized edema: Secondary | ICD-10-CM

## 2019-07-08 DIAGNOSIS — M25672 Stiffness of left ankle, not elsewhere classified: Secondary | ICD-10-CM | POA: Diagnosis not present

## 2019-07-08 DIAGNOSIS — R293 Abnormal posture: Secondary | ICD-10-CM

## 2019-07-08 DIAGNOSIS — R2689 Other abnormalities of gait and mobility: Secondary | ICD-10-CM

## 2019-07-08 DIAGNOSIS — M25572 Pain in left ankle and joints of left foot: Secondary | ICD-10-CM | POA: Diagnosis not present

## 2019-07-08 DIAGNOSIS — R2681 Unsteadiness on feet: Secondary | ICD-10-CM

## 2019-07-08 DIAGNOSIS — M6281 Muscle weakness (generalized): Secondary | ICD-10-CM | POA: Diagnosis not present

## 2019-07-08 NOTE — Therapy (Signed)
[facility] [place], [place], [address] Phone: [phone]   Fax:  [phone]  Physical Therapy Treatment  Patient Details  Name: CONSUELLA SCURLOCK MRN: [id number] Date of Birth: 01-09-1996 Referring Provider (PT): Frankey Shown, MD   Encounter Date: 07/08/2019  PT End of Session - 07/08/19 1355    Visit Number  4    Number of Visits  23    Date for PT Re-Evaluation  09/05/19    Authorization Type  $25 co-pay, ded $350 met, OOP max $5000, $719.13 remaining, 75 PT visit limit per year    PT Start Time  1301    PT Stop Time  1351    PT Time Calculation (min)  50 min    Activity Tolerance  Patient tolerated treatment well    Behavior During Therapy  [facility] for tasks assessed/performed       Past Medical History:  Diagnosis Date  . Anxiety   . Back pain    pt states d/t "slipped disc"  . Depression   . Migraines     Past Surgical History:  Procedure Laterality Date  . ORIF ANKLE FRACTURE Left 05/15/2019   Procedure: OPEN REDUCTION INTERNAL FIXATION (ORIF) LEFT BIMALLEOLAR ANKLE AND SYNDESMOSIS;  Surgeon: Leandrew Koyanagi, MD;  Location: [place];  Service: Orthopedics;  Laterality: Left;  . WISDOM TOOTH EXTRACTION      There were no vitals filed for this visit.  Subjective Assessment - 07/08/19 1303    Subjective  doing well - having occasional piercing pains along incision, otherwise no issues.    Pertinent History  anxiety, depression, migraines, back pain    Limitations  Walking;Standing;House hold activities    Patient Stated Goals  to be walk again, full range & function in ankle back to where it was    Currently in Pain?  No/denies    Pain Onset  1 to 4 weeks ago    Pain Onset  More than a month ago                        [facility] Adult PT Treatment/Exercise - 07/08/19 1304      Vasopneumatic   Number Minutes Vasopneumatic   10 minutes    Vasopnuematic Location   Ankle    Vasopneumatic  Pressure  Medium    Vasopneumatic Temperature   34      Manual Therapy   Manual therapy comments  g3 ap mobs, subtalar medial/lateral mobs to Lt ankle      Ankle Exercises: Seated   Ankle Circles/Pumps  Left;20 reps   CW/CCW/pumps   BAPS  Level 2;Sitting   30 x fwd/back, side to side, circles cw, ccw each   Other Seated Ankle Exercises  seated DF, pf, inv, ev green band 3 x 10 each way    Other Seated Ankle Exercises  DF c strap 30 sec x 3 Lt LE               PT Short Term Goals - 07/01/19 2152      PT SHORT TERM GOAL #1   Title  Patient demonstrates & verbalizes understanding of initial HEP. (All STGs Target Date 08/01/2019)    Time  5    Period  Weeks    Status  New    Target Date  08/01/19      PT SHORT TERM GOAL #2   Title  Left ankle PROM dorsiflexion >10* &  plantarflexion >40*    Time  5    Period  Weeks    Status  New    Target Date  08/01/19      PT SHORT TERM GOAL #3   Title  Patient ambulates 300' with crutches PWB modified independent.    Time  5    Period  Weeks    Status  New    Target Date  08/01/19      PT SHORT TERM GOAL #4   Title  Patient negotiates stairs with single rail & crutch, ramps & curbs with crutches PWB with supervision.    Time  5    Period  Weeks    Status  New    Target Date  08/01/19      PT SHORT TERM GOAL #5   Title  left ankle pain </= 3/10 with activities    Time  5    Period  Weeks    Status  New    Target Date  08/01/19        PT Long Term Goals - 07/01/19 2146      PT LONG TERM GOAL #1   Title  Patient verbalizes & demonstrates understanding of ongoing HEP / fitness plan. (All LTGs Target Date: 09/05/2019)    Time  10    Period  Weeks    Status  New    Target Date  09/05/19      PT LONG TERM GOAL #2   Title  Left Ankle AROM Dorsiflexion 15*, Plantarflexion 60*    Time  10    Period  Weeks    Status  New    Target Date  09/05/19      PT LONG TERM GOAL #3   Title  Patient ambulates >500' modified  independent LRAD with full WB or WBAT in compliance with MD orders.    Time  10    Period  Weeks    Status  New    Target Date  09/05/19      PT LONG TERM GOAL #4   Title  Patient negotiates stairs single rail, ramps & curbs modified independent LRAD with full WB or WBAT in compliance with MD orders.    Time  10    Period  Weeks    Status  New    Target Date  09/05/19      PT LONG TERM GOAL #5   Title  Berg Balance >45/56 to indicate lower fall risk    Time  10    Period  Weeks    Status  New    Target Date  09/05/19      Additional Long Term Goals   Additional Long Term Goals  Yes      PT LONG TERM GOAL #6   Title  Patient reports left ankle pain </= 1/10 and low back pain at prior level or better.    Time  10    Period  Weeks    Status  New    Target Date  09/05/19            Plan - 07/08/19 1355    Clinical Impression Statement  Pt tolerated session well today without difficulty.  Will continue to benefit from PT to maximize function.  No increase in pain with AROM and strengthening exercises.    Personal Factors and Comorbidities  Comorbidity 3+    Comorbidities  anxiety, depression, migraines, back pain    Examination-Activity Limitations  Locomotion Level;Squat;Stairs;Stand;Transfers    Examination-Participation Restrictions  Driving    Stability/Clinical Decision Making  Evolving/Moderate complexity    Rehab Potential  Good    PT Frequency  --   2-3x/week   PT Duration  Other (comment)   10 weeks   PT Treatment/Interventions  ADLs/Self Care Home Management;Cryotherapy;Electrical Stimulation;DME Instruction;Gait training;Stair training;Functional mobility training;Therapeutic activities;Therapeutic exercise;Balance training;Neuromuscular re-education;Patient/family education;Manual techniques;Scar mobilization;Passive range of motion;Dry needling;Vasopneumatic Device    PT Next Visit Plan  Continue progress of mobility and strength in NWB, work on gait training  with crutches    PT [place] and Agree with Plan of Care  Patient       Patient will benefit from skilled therapeutic intervention in order to improve the following deficits and impairments:  Abnormal gait, Decreased balance, Decreased endurance, Decreased mobility, Decreased range of motion, Decreased scar mobility, Decreased strength, Increased edema, Obesity, Pain  Visit Diagnosis: Pain in left ankle and joints of left foot  Stiffness of left ankle, not elsewhere classified  Muscle weakness (generalized)  Localized edema  Other abnormalities of gait and mobility  Unsteadiness on feet  Abnormal posture  History of fall     Problem List Patient Active Problem List   Diagnosis Date Noted  . Displaced bimalleolar fracture of left lower leg, initial encounter for closed fracture 05/15/2019  . Ankle syndesmosis disruption, left, initial encounter 05/15/2019  . Migraine without aura and without status migrainosus, not intractable 07/22/2018  . Visit for routine gyn exam 02/08/2017  . Anxiety and depression 01/20/2014  . Encounter for other general counseling or advice on contraception 01/20/2014      Laureen Abrahams, PT, DPT 07/08/19 1:57 PM     [facility] [address] [place], [place], [address] Phone: [phone]   Fax:  [phone]  Name: DRISHTI PEPPERMAN MRN: [id number] Date of Birth: 1995/08/28

## 2019-07-11 ENCOUNTER — Ambulatory Visit: Payer: Federal, State, Local not specified - PPO | Admitting: Rehabilitative and Restorative Service Providers"

## 2019-07-11 ENCOUNTER — Other Ambulatory Visit: Payer: Self-pay

## 2019-07-11 ENCOUNTER — Encounter: Payer: Self-pay | Admitting: Rehabilitative and Restorative Service Providers"

## 2019-07-11 DIAGNOSIS — M25572 Pain in left ankle and joints of left foot: Secondary | ICD-10-CM | POA: Diagnosis not present

## 2019-07-11 DIAGNOSIS — M6281 Muscle weakness (generalized): Secondary | ICD-10-CM

## 2019-07-11 DIAGNOSIS — M25672 Stiffness of left ankle, not elsewhere classified: Secondary | ICD-10-CM

## 2019-07-11 DIAGNOSIS — R2689 Other abnormalities of gait and mobility: Secondary | ICD-10-CM

## 2019-07-11 DIAGNOSIS — R600 Localized edema: Secondary | ICD-10-CM | POA: Diagnosis not present

## 2019-07-11 NOTE — Therapy (Signed)
[facility] [place], [place], [address] Phone: [phone]   Fax:  [phone]  Physical Therapy Treatment  Patient Details  Name: Kylie Ware MRN: [id number] Date of Birth: 1995/05/02 Referring Provider (PT): Frankey Shown, MD   Encounter Date: 07/11/2019  PT End of Session - 07/11/19 1424    Visit Number  5    Number of Visits  23    Date for PT Re-Evaluation  09/05/19    Authorization Type  $25 co-pay, ded $350 met, OOP max $5000, $719.13 remaining, 75 PT visit limit per year    PT Start Time  1423    PT Stop Time  1511    PT Time Calculation (min)  48 min    Activity Tolerance  Patient tolerated treatment well    Behavior During Therapy  [facility] for tasks assessed/performed       Past Medical History:  Diagnosis Date  . Anxiety   . Back pain    pt states d/t "slipped disc"  . Depression   . Migraines     Past Surgical History:  Procedure Laterality Date  . ORIF ANKLE FRACTURE Left 05/15/2019   Procedure: OPEN REDUCTION INTERNAL FIXATION (ORIF) LEFT BIMALLEOLAR ANKLE AND SYNDESMOSIS;  Surgeon: Leandrew Koyanagi, MD;  Location: [facility];  Service: Orthopedics;  Laterality: Left;  . WISDOM TOOTH EXTRACTION      There were no vitals filed for this visit.  Subjective Assessment - 07/11/19 1444    Subjective  Pt. stated no pain reports since last visit.  Pt. still reported fear on pressure on Lt LE c walking.    Pertinent History  anxiety, depression, migraines, back pain    Limitations  Walking;Standing;House hold activities    Patient Stated Goals  to be walk again, full range & function in ankle back to where it was    Currently in Pain?  No/denies    Pain Score  0-No pain    Pain Location  Ankle    Pain Orientation  Left    Pain Descriptors / Indicators  Aching    Pain Onset  1 to 4 weeks ago    Pain Frequency  Intermittent    Aggravating Factors   no complaint    Multiple Pain Sites  No    Pain  Onset  More than a month ago                        [facility] Adult PT Treatment/Exercise - 07/11/19 0001      Ambulation/Gait   Gait Comments  Axillary crutch sequencing c SBA c focus on 25% TDWB c cues consistent.  Distance 75 ft, 50 ft      Vasopneumatic   Number Minutes Vasopneumatic   10 minutes    Vasopnuematic Location   Ankle    Vasopneumatic Pressure  Medium    Vasopneumatic Temperature   34      Manual Therapy   Manual therapy comments  g3 ap mobs, subtalar medial/lateral mobs to Lt ankle      Ankle Exercises: Seated   BAPS  Sitting   fitter board fwd/back, circles 30 x each way   Other Seated Ankle Exercises  blue band 4 way ankle 3 x 15 Lt LE    Other Seated Ankle Exercises  DF c strap 30 sec x 3 Lt LE               PT  Short Term Goals - 07/01/19 2152      PT SHORT TERM GOAL #1   Title  Patient demonstrates & verbalizes understanding of initial HEP. (All STGs Target Date 08/01/2019)    Time  5    Period  Weeks    Status  New    Target Date  08/01/19      PT SHORT TERM GOAL #2   Title  Left ankle PROM dorsiflexion >10* & plantarflexion >40*    Time  5    Period  Weeks    Status  New    Target Date  08/01/19      PT SHORT TERM GOAL #3   Title  Patient ambulates 300' with crutches PWB modified independent.    Time  5    Period  Weeks    Status  New    Target Date  08/01/19      PT SHORT TERM GOAL #4   Title  Patient negotiates stairs with single rail & crutch, ramps & curbs with crutches PWB with supervision.    Time  5    Period  Weeks    Status  New    Target Date  08/01/19      PT SHORT TERM GOAL #5   Title  left ankle pain </= 3/10 with activities    Time  5    Period  Weeks    Status  New    Target Date  08/01/19        PT Long Term Goals - 07/01/19 2146      PT LONG TERM GOAL #1   Title  Patient verbalizes & demonstrates understanding of ongoing HEP / fitness plan. (All LTGs Target Date: 09/05/2019)    Time  10     Period  Weeks    Status  New    Target Date  09/05/19      PT LONG TERM GOAL #2   Title  Left Ankle AROM Dorsiflexion 15*, Plantarflexion 60*    Time  10    Period  Weeks    Status  New    Target Date  09/05/19      PT LONG TERM GOAL #3   Title  Patient ambulates >500' modified independent LRAD with full WB or WBAT in compliance with MD orders.    Time  10    Period  Weeks    Status  New    Target Date  09/05/19      PT LONG TERM GOAL #4   Title  Patient negotiates stairs single rail, ramps & curbs modified independent LRAD with full WB or WBAT in compliance with MD orders.    Time  10    Period  Weeks    Status  New    Target Date  09/05/19      PT LONG TERM GOAL #5   Title  Berg Balance >45/56 to indicate lower fall risk    Time  10    Period  Weeks    Status  New    Target Date  09/05/19      Additional Long Term Goals   Additional Long Term Goals  Yes      PT LONG TERM GOAL #6   Title  Patient reports left ankle pain </= 1/10 and low back pain at prior level or better.    Time  10    Period  Weeks    Status  New    Target  Date  09/05/19            Plan - 07/11/19 1447    Clinical Impression Statement  Pt. doing well c HEP use at this time.  Progress in further strengthening/balance obviously paired c transition in WB protocol upon next MD visit.  Mild limitations in ankle mobility still evident.    Personal Factors and Comorbidities  Comorbidity 3+    Comorbidities  anxiety, depression, migraines, back pain    Examination-Activity Limitations  Locomotion Level;Squat;Stairs;Stand;Transfers    Examination-Participation Restrictions  Driving    Stability/Clinical Decision Making  Evolving/Moderate complexity    Rehab Potential  Good    PT Frequency  --   2-3x/week   PT Duration  Other (comment)   10 weeks   PT Treatment/Interventions  ADLs/Self Care Home Management;Cryotherapy;Electrical Stimulation;DME Instruction;Gait training;Stair training;Functional  mobility training;Therapeutic activities;Therapeutic exercise;Balance training;Neuromuscular re-education;Patient/family education;Manual techniques;Scar mobilization;Passive range of motion;Dry needling;Vasopneumatic Device    PT Next Visit Plan  Recommend decreased frequency in clinic c use of HEP 1x/week until WB restrictions change, then recommend 2x/week.    PT Home Exercise Plan  [id number]    Consulted and Agree with Plan of Care  Patient       Patient will benefit from skilled therapeutic intervention in order to improve the following deficits and impairments:  Abnormal gait, Decreased balance, Decreased endurance, Decreased mobility, Decreased range of motion, Decreased scar mobility, Decreased strength, Increased edema, Obesity, Pain  Visit Diagnosis: Pain in left ankle and joints of left foot  Stiffness of left ankle, not elsewhere classified  Localized edema  Muscle weakness (generalized)  Other abnormalities of gait and mobility     Problem List Patient Active Problem List   Diagnosis Date Noted  . Displaced bimalleolar fracture of left lower leg, initial encounter for closed fracture 05/15/2019  . Ankle syndesmosis disruption, left, initial encounter 05/15/2019  . Migraine without aura and without status migrainosus, not intractable 07/22/2018  . Visit for routine gyn exam 02/08/2017  . Anxiety and depression 01/20/2014  . Encounter for other general counseling or advice on contraception 01/20/2014   Scot Jun, PT, DPT, OCS, ATC 07/11/19  2:57 PM    [facility] [address] [place], [place], [address] Phone: [phone]   Fax:  [phone]  Name: Kylie Ware MRN: [id number] Date of Birth: 1995-12-31

## 2019-07-14 ENCOUNTER — Ambulatory Visit: Payer: Federal, State, Local not specified - PPO | Admitting: Physical Therapy

## 2019-07-14 ENCOUNTER — Encounter: Payer: Self-pay | Admitting: Physical Therapy

## 2019-07-14 ENCOUNTER — Other Ambulatory Visit: Payer: Self-pay

## 2019-07-14 DIAGNOSIS — M25572 Pain in left ankle and joints of left foot: Secondary | ICD-10-CM | POA: Diagnosis not present

## 2019-07-14 DIAGNOSIS — R600 Localized edema: Secondary | ICD-10-CM | POA: Diagnosis not present

## 2019-07-14 DIAGNOSIS — R293 Abnormal posture: Secondary | ICD-10-CM

## 2019-07-14 DIAGNOSIS — M6281 Muscle weakness (generalized): Secondary | ICD-10-CM

## 2019-07-14 DIAGNOSIS — Z9181 History of falling: Secondary | ICD-10-CM

## 2019-07-14 DIAGNOSIS — M25672 Stiffness of left ankle, not elsewhere classified: Secondary | ICD-10-CM

## 2019-07-14 DIAGNOSIS — R2689 Other abnormalities of gait and mobility: Secondary | ICD-10-CM

## 2019-07-14 DIAGNOSIS — R2681 Unsteadiness on feet: Secondary | ICD-10-CM

## 2019-07-14 NOTE — Therapy (Signed)
[facility] [place], [place], [address] Phone: [phone]   Fax:  [phone]  Physical Therapy Treatment  Patient Details  Name: Kylie Ware MRN: [id number] Date of Birth: [birth year] Referring Provider (PT): Frankey Shown, MD   Encounter Date: 07/14/2019  PT End of Session - 07/14/19 1420    Visit Number  6    Number of Visits  23    Date for PT Re-Evaluation  09/05/19    Authorization Type  $25 co-pay, ded $350 met, OOP max $5000, $719.13 remaining, 75 PT visit limit per year    PT Start Time  1346    PT Stop Time  1426    PT Time Calculation (min)  40 min    Activity Tolerance  Patient tolerated treatment well    Behavior During Therapy  [facility] for tasks assessed/performed       Past Medical History:  Diagnosis Date  . Anxiety   . Back pain    pt states d/t "slipped disc"  . Depression   . Migraines     Past Surgical History:  Procedure Laterality Date  . ORIF ANKLE FRACTURE Left 05/15/2019   Procedure: OPEN REDUCTION INTERNAL FIXATION (ORIF) LEFT BIMALLEOLAR ANKLE AND SYNDESMOSIS;  Surgeon: Leandrew Koyanagi, MD;  Location: [facility];  Service: Orthopedics;  Laterality: Left;  . WISDOM TOOTH EXTRACTION      There were no vitals filed for this visit.  Subjective Assessment - 07/14/19 1347    Subjective  no pain, doing well overall.    Pertinent History  anxiety, depression, migraines, back pain    Limitations  Walking;Standing;House hold activities    Patient Stated Goals  to be walk again, full range & function in ankle back to where it was    Currently in Pain?  No/denies    Pain Onset  1 to 4 weeks ago    Pain Onset  More than a month ago                        [facility] Adult PT Treatment/Exercise - 07/14/19 1348      Vasopneumatic   Number Minutes Vasopneumatic   10 minutes    Vasopnuematic Location   Ankle    Vasopneumatic Pressure  Medium    Vasopneumatic Temperature   34       Manual Therapy   Manual therapy comments  g3 ap mobs, grade 2-3 metatarsal mobs, subtalar medial/lateral mobs to Lt ankle; Lt ankle PROM DF/PF with STM into anterior tib      Ankle Exercises: Seated   ABC's  2 reps   upper/lower   Ankle Circles/Pumps  Left;20 reps   CW/CCW/pumps   Other Seated Ankle Exercises  blue band 4 way ankle 3 x 20 Lt LE; arch lifts 3x10 on Lt    Other Seated Ankle Exercises  DF c strap 30 sec x 3 Lt LE               PT Short Term Goals - 07/01/19 2152      PT SHORT TERM GOAL #1   Title  Patient demonstrates & verbalizes understanding of initial HEP. (All STGs Target Date 08/01/2019)    Time  5    Period  Weeks    Status  New    Target Date  08/01/19      PT SHORT TERM GOAL #2   Title  Left ankle PROM dorsiflexion >10* &  plantarflexion >40*    Time  5    Period  Weeks    Status  New    Target Date  08/01/19      PT SHORT TERM GOAL #3   Title  Patient ambulates 300' with crutches PWB modified independent.    Time  5    Period  Weeks    Status  New    Target Date  08/01/19      PT SHORT TERM GOAL #4   Title  Patient negotiates stairs with single rail & crutch, ramps & curbs with crutches PWB with supervision.    Time  5    Period  Weeks    Status  New    Target Date  08/01/19      PT SHORT TERM GOAL #5   Title  left ankle pain </= 3/10 with activities    Time  5    Period  Weeks    Status  New    Target Date  08/01/19        PT Long Term Goals - 07/01/19 2146      PT LONG TERM GOAL #1   Title  Patient verbalizes & demonstrates understanding of ongoing HEP / fitness plan. (All LTGs Target Date: 09/05/2019)    Time  10    Period  Weeks    Status  New    Target Date  09/05/19      PT LONG TERM GOAL #2   Title  Left Ankle AROM Dorsiflexion 15*, Plantarflexion 60*    Time  10    Period  Weeks    Status  New    Target Date  09/05/19      PT LONG TERM GOAL #3   Title  Patient ambulates >500' modified independent LRAD  with full WB or WBAT in compliance with MD orders.    Time  10    Period  Weeks    Status  New    Target Date  09/05/19      PT LONG TERM GOAL #4   Title  Patient negotiates stairs single rail, ramps & curbs modified independent LRAD with full WB or WBAT in compliance with MD orders.    Time  10    Period  Weeks    Status  New    Target Date  09/05/19      PT LONG TERM GOAL #5   Title  Berg Balance >45/56 to indicate lower fall risk    Time  10    Period  Weeks    Status  New    Target Date  09/05/19      Additional Long Term Goals   Additional Long Term Goals  Yes      PT LONG TERM GOAL #6   Title  Patient reports left ankle pain </= 1/10 and low back pain at prior level or better.    Time  10    Period  Weeks    Status  New    Target Date  09/05/19            Plan - 07/14/19 1420    Clinical Impression Statement  Pt doing well at this time, and overall progressing well with PT.  Will need to wait until after MD allows WB progression to progress balance and strengthening.  Will continue to benefit from PT to maximize function.    Personal Factors and Comorbidities  Comorbidity 3+    Comorbidities  anxiety, depression, migraines, back pain    Examination-Activity Limitations  Locomotion Level;Squat;Stairs;Stand;Transfers    Examination-Participation Restrictions  Driving    Stability/Clinical Decision Making  Evolving/Moderate complexity    Rehab Potential  Good    PT Frequency  --   2-3x/week   PT Duration  Other (comment)   10 weeks   PT Treatment/Interventions  ADLs/Self Care Home Management;Cryotherapy;Electrical Stimulation;DME Instruction;Gait training;Stair training;Functional mobility training;Therapeutic activities;Therapeutic exercise;Balance training;Neuromuscular re-education;Patient/family education;Manual techniques;Scar mobilization;Passive range of motion;Dry needling;Vasopneumatic Device    PT Next Visit Plan  Recommend decreased frequency in clinic  c use of HEP 1x/week until WB restrictions change, then recommend 2x/week.    PT Home Exercise Plan  [id number]    Consulted and Agree with Plan of Care  Patient       Patient will benefit from skilled therapeutic intervention in order to improve the following deficits and impairments:  Abnormal gait, Decreased balance, Decreased endurance, Decreased mobility, Decreased range of motion, Decreased scar mobility, Decreased strength, Increased edema, Obesity, Pain  Visit Diagnosis: Pain in left ankle and joints of left foot  Stiffness of left ankle, not elsewhere classified  Localized edema  Muscle weakness (generalized)  Other abnormalities of gait and mobility  Unsteadiness on feet  Abnormal posture  History of fall     Problem List Patient Active Problem List   Diagnosis Date Noted  . Displaced bimalleolar fracture of left lower leg, initial encounter for closed fracture 05/15/2019  . Ankle syndesmosis disruption, left, initial encounter 05/15/2019  . Migraine without aura and without status migrainosus, not intractable 07/22/2018  . Visit for routine gyn exam 02/08/2017  . Anxiety and depression 01/20/2014  . Encounter for other general counseling or advice on contraception 01/20/2014      Laureen Abrahams, PT, DPT 07/14/19 2:23 PM     [facility] [address] [place], [place], [address] Phone: [phone]   Fax:  [phone]  Name: KHALI PERELLA MRN: [id number] Date of Birth: [birth year]

## 2019-07-16 ENCOUNTER — Encounter: Payer: Federal, State, Local not specified - PPO | Admitting: Physical Therapy

## 2019-07-17 ENCOUNTER — Encounter: Payer: Federal, State, Local not specified - PPO | Admitting: Physical Therapy

## 2019-07-17 ENCOUNTER — Ambulatory Visit: Payer: Federal, State, Local not specified - PPO | Admitting: Family Medicine

## 2019-07-17 ENCOUNTER — Encounter: Payer: Self-pay | Admitting: Family Medicine

## 2019-07-17 VITALS — BP 124/74 | HR 91 | Ht 67.0 in | Wt 240.0 lb

## 2019-07-17 DIAGNOSIS — G43009 Migraine without aura, not intractable, without status migrainosus: Secondary | ICD-10-CM | POA: Diagnosis not present

## 2019-07-17 NOTE — Progress Notes (Signed)
I reviewed note and agree with plan.   Suanne Marker, MD 07/17/2019, 4:29 PM Certified in Neurology, Neurophysiology and Neuroimaging  [facility] [address] [place], [place] [address] [phone]

## 2019-07-17 NOTE — Patient Instructions (Signed)
We will continue to monitor migraines. You may use ibuprofen as needed for abortive therapy. Please call me with any worsening symptoms.   Follow up as needed   Migraine Headache A migraine headache is a very strong throbbing pain on one side or both sides of your head. This type of headache can also cause other symptoms. It can last from 4 hours to 3 days. Talk with your doctor about what things may bring on (trigger) this condition. What are the causes? The exact cause of this condition is not known. This condition may be triggered or caused by:  Drinking alcohol.  Smoking.  Taking medicines, such as: ? Medicine used to treat chest pain (nitroglycerin). ? Birth control pills. ? Estrogen. ? Some blood pressure medicines.  Eating or drinking certain products.  Doing physical activity. Other things that may trigger a migraine headache include:  Having a menstrual period.  Pregnancy.  Hunger.  Stress.  Not getting enough sleep or getting too much sleep.  Weather changes.  Tiredness (fatigue). What increases the risk?  Being 47-85 years old.  Being female.  Having a family history of migraine headaches.  Being Caucasian.  Having depression or anxiety.  Being very overweight. What are the signs or symptoms?  A throbbing pain. This pain may: ? Happen in any area of the head, such as on one side or both sides. ? Make it hard to do daily activities. ? Get worse with physical activity. ? Get worse around bright lights or loud noises.  Other symptoms may include: ? Feeling sick to your stomach (nauseous). ? Vomiting. ? Dizziness. ? Being sensitive to bright lights, loud noises, or smells.  Before you get a migraine headache, you may get warning signs (an aura). An aura may include: ? Seeing flashing lights or having blind spots. ? Seeing bright spots, halos, or zigzag lines. ? Having tunnel vision or blurred vision. ? Having numbness or a tingling  feeling. ? Having trouble talking. ? Having weak muscles.  Some people have symptoms after a migraine headache (postdromal phase), such as: ? Tiredness. ? Trouble thinking (concentrating). How is this treated?  Taking medicines that: ? Relieve pain. ? Relieve the feeling of being sick to your stomach. ? Prevent migraine headaches.  Treatment may also include: ? Having acupuncture. ? Avoiding foods that bring on migraine headaches. ? Learning ways to control your body functions (biofeedback). ? Therapy to help you know and deal with negative thoughts (cognitive behavioral therapy). Follow these instructions at home: Medicines  Take over-the-counter and prescription medicines only as told by your doctor.  Ask your doctor if the medicine prescribed to you: ? Requires you to avoid driving or using heavy machinery. ? Can cause trouble pooping (constipation). You may need to take these steps to prevent or treat trouble pooping:  Drink enough fluid to keep your pee (urine) pale yellow.  Take over-the-counter or prescription medicines.  Eat foods that are high in fiber. These include beans, whole grains, and fresh fruits and vegetables.  Limit foods that are high in fat and sugar. These include fried or sweet foods. Lifestyle  Do not drink alcohol.  Do not use any products that contain nicotine or tobacco, such as cigarettes, e-cigarettes, and chewing tobacco. If you need help quitting, ask your doctor.  Get at least 8 hours of sleep every night.  Limit and deal with stress. General instructions      Keep a journal to find out what may bring on  your migraine headaches. For example, write down: ? What you eat and drink. ? How much sleep you get. ? Any change in what you eat or drink. ? Any change in your medicines.  If you have a migraine headache: ? Avoid things that make your symptoms worse, such as bright lights. ? It may help to lie down in a dark, quiet  room. ? Do not drive or use heavy machinery. ? Ask your doctor what activities are safe for you.  Keep all follow-up visits as told by your doctor. This is important. Contact a doctor if:  You get a migraine headache that is different or worse than others you have had.  You have more than 15 headache days in one month. Get help right away if:  Your migraine headache gets very bad.  Your migraine headache lasts longer than 72 hours.  You have a fever.  You have a stiff neck.  You have trouble seeing.  Your muscles feel weak or like you cannot control them.  You start to lose your balance a lot.  You start to have trouble walking.  You pass out (faint).  You have a seizure. Summary  A migraine headache is a very strong throbbing pain on one side or both sides of your head. These headaches can also cause other symptoms.  This condition may be treated with medicines and changes to your lifestyle.  Keep a journal to find out what may bring on your migraine headaches.  Contact a doctor if you get a migraine headache that is different or worse than others you have had.  Contact your doctor if you have more than 15 headache days in a month. This information is not intended to replace advice given to you by your health care provider. Make sure you discuss any questions you have with your health care provider. Document Revised: 05/17/2018 Document Reviewed: 03/07/2018 Elsevier Patient Education  2020 [employer].

## 2019-07-17 NOTE — Progress Notes (Signed)
PATIENT: Kylie Ware DOB: 31-May-1995  REASON FOR VISIT: follow up HISTORY FROM: patient  Chief Complaint  Patient presents with  . Follow-up    rm 1 here for a migraine f/u.      HISTORY OF PRESENT ILLNESS: Today 07/17/19 Kylie Ware is a 24 y.o. female here today for follow up for migraines. She has discontinued propranolol and diclofenac. She feels that headaches have been well managed. She has about 6-8 migraines over the past year. Dark room, rest and getting away from the computer helps. She can take ibuprofen 400mg  and this will abort migraine. She recently broke her left ankle. She is followed closely by ortho.    HISTORY: (copied from my note on 07/22/2018)  Kylie Ware is a 24 y.o. female here today for follow up of migraines.  She continues to do well on propranolol 20 mg twice daily.  She is using diclofenac 50 mg as needed for abortive therapy.  She is also taking Zoloft.  She feels that her migraines are well managed.  She rarely has to use abortive therapy.  She states that since the pandemic her stress levels have decreased significantly and she feels this is helped with her headaches.   HISTORY (copied from [facility] note on 11/20/2017)  Kylie Ware is a 24 year old female with a history of migraine headaches. She returns today for follow-up. She reports that she is having approximately 3 headaches a week. She states that her headaches typically occur in the temporal region bilaterally or behind the right eye. He states that typically at the beginning or near the middle of her headache she will have visual disturbance. Typically what appears to be a visual loss in her visual field. She states that she is not completely without vision during this event. She does have some photophobia and phonophobia. She will also have nausea and vomiting. She denies any numbness and tingling in the arms or legs. She states that she discontinued  propranolol approximately 1 month ago. This is approximately when her headaches worsened. She states that she stopped the medication because her headaches were doing well. She returns today for evaluation.   HISTORY07/03/19: Kylie Ware is a 24 year old female with a history of migraine headaches. She returns today for follow-up. She is currently on propranololand diclofenac. She reports that she has not had a migraine in several months. She states that she has to take diclofenac 1-2 times a week that she will occasionally wake up with a dull headache. He states that this is due because she does not sleep for 4 hours some nights due to her work schedule. She is currently in school at [employer]. She returns today for evaluation.  UPDATE (02/13/17, VRP): Since last visit,was doing well with addition of propranolol 20mg twice a dayand HA resolved, until exams during December 2018. Also forgot to take propranolol for 2 weeks during stressful time and HA worsened.Now back on propranolol and doing well.Toleratingdiclofenac (effective).Aggravating factorsare stress and lack of sleep. Has mild HA today.  UPDATE 08/29/16: Since last visit, was doing well with HA, but then noted more memory issues, and therefore decr her TPX to 25mg  at bedtime. Now HA are 2x / week.   UPDATE 01/26/16: Since last visit, now with more HA (now every other day), related to school stress. Overall tolerating TPX. Using ibuprofen or naproxen for migraine rescue, but not as effective anymore.   UPDATE 07/26/15: Since last visit, doing well. Avg 1 HA per  month. During classes and finals, HA were worse. Now doing better. Tolerating TPX.  UPDATE 01/25/15: Since last visit, doing well. Occ has HA (1 every 2 weeks) but milder. Overall stable. Lack of sleep is still a trigger for HA. Currently home for winter break.  UPDATE 08/25/14: Since last visit, HA resolved, since increasing TPX to 50mg  BID.  Some mild word finding diff. Overall doing well.   PRIOR HPI (07/13/14): 24 year old right-handed female here for evaluation of headaches. March 2016 patient had onset of daily headaches, bitemporal, retro-orbital, pressure and throbbing sensation with blurred vision, dizziness, balance difficulties. Her vision difficulties are described as if she is looking to the car windshield with rain, without using the windshield wipers. She reports some photophobia, phonophobia, nausea. No vomiting. Sometimes she sees "clear sparkles" during the headaches. Headaches may last all day or several hours at a time. She's had some headache at least on a daily basis since March. Patient had one episode of headache in ninth grade, with blurred vision and emergency room evaluation. She is not sure if she was ever diagnosed with migraine headache. She has family history of migraine in her father, who takes topiramate. January 2016 patient had onset of sleep disturbance, insomnia, related to change in her roommate sleeping pattern. Patient was then used to having a roommate, and when she was having to sleep alone, she had more difficulty. Typically she lays down around midnight, falls asleep around 2 AM, wakes up 2-3 times in the night, and then wakes up at 11:00 in the morning or 1:00 in the afternoon. Patient also reports onset of depression and anxiety symptoms in August 2015. She is on medication and seeing a psychiatrist. No other changes in caffeine, stress, diet or exercise.   REVIEW OF SYSTEMS: Out of a complete 14 system review of symptoms, the patient complains only of the following symptoms, none and all other reviewed systems are negative.  ALLERGIES: No Known Allergies  HOME MEDICATIONS: Outpatient Medications Prior to Visit  Medication Sig Dispense Refill  . acetaminophen (TYLENOL) 500 MG tablet Take 500 mg by mouth every 6 (six) hours as needed.    September 2015 aspirin 81 MG EC tablet TAKE 1 TABLET BY MOUTH EVERY DAY 21  tablet 0  . ibuprofen (ADVIL) 200 MG tablet Take 200 mg by mouth every 6 (six) hours as needed.    . ondansetron (ZOFRAN) 4 MG tablet Take 1-2 tablets (4-8 mg total) by mouth every 8 (eight) hours as needed for nausea or vomiting. 20 tablet 0  . oxyCODONE (OXY IR/ROXICODONE) 5 MG immediate release tablet Take 1-2 tablets (5-10 mg total) by mouth every 8 (eight) hours as needed for severe Ware. 30 tablet 0  . oxyCODONE-acetaminophen (PERCOCET/ROXICET) 5-325 MG tablet Take 1 tablet by mouth every 6 (six) hours as needed for severe Ware. 24 tablet 0   No facility-administered medications prior to visit.    PAST MEDICAL HISTORY: Past Medical History:  Diagnosis Date  . Anxiety   . Back Ware    pt states d/t "slipped disc"  . Depression   . Migraines     PAST SURGICAL HISTORY: Past Surgical History:  Procedure Laterality Date  . ORIF ANKLE FRACTURE Left 05/15/2019   Procedure: OPEN REDUCTION INTERNAL FIXATION (ORIF) LEFT BIMALLEOLAR ANKLE AND SYNDESMOSIS;  Surgeon: 07/15/2019, MD;  Location: [facility];  Service: Orthopedics;  Laterality: Left;  . WISDOM TOOTH EXTRACTION      FAMILY HISTORY: Family History  Problem Relation  Age of Onset  . Diabetes Father   . Cancer Mother        breast  . Cancer Maternal Grandmother 35       breast CA    SOCIAL HISTORY: Social History   Socioeconomic History  . Marital status: Single    Spouse name: Not on file  . Number of children: Not on file  . Years of education: Not on file  . Highest education level: Not on file  Occupational History  . Not on file  Tobacco Use  . Smoking status: Never Smoker  . Smokeless tobacco: Never Used  Vaping Use  . Vaping Use: Never used  Substance and Sexual Activity  . Alcohol use: No    Alcohol/week: 0.0 standard drinks  . Drug use: No  . Sexual activity: Not on file  Other Topics Concern  . Not on file  Social History Narrative   Attending App   Sexually active   Non  smoking.      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   Social Determinants of Health   Financial Resource Strain:   . Difficulty of Paying Living Expenses:   Food Insecurity:   . Worried About Programme researcher, broadcasting/film/video in the Last Year:   . Barista in the Last Year:   Transportation Needs:   . Freight forwarder (Medical):   Marland Kitchen Lack of Transportation (Non-Medical):   Physical Activity:   . Days of Exercise per Week:   . Minutes of Exercise per Session:   Stress:   . Feeling of Stress :   Social Connections:   . Frequency of Communication with Friends and Family:   . Frequency of Social Gatherings with Friends and Family:   . Attends Religious Services:   . Active Member of Clubs or Organizations:   . Attends Banker Meetings:   Marland Kitchen Marital Status:   Intimate Partner Violence:   . Fear of Current or Ex-Partner:   . Emotionally Abused:   Marland Kitchen Physically Abused:   . Sexually Abused:       PHYSICAL EXAM  Vitals:   07/17/19 1012  BP: 124/74  Pulse: 91  Weight: 240 lb (108.9 kg)  Height: 5\' 7"  (1.702 m)   Body mass index is 37.59 kg/m.  Generalized: Well developed, in no acute distress  Cardiology: normal rate and rhythm, no murmur noted Respiratory: clear to auscultation bilaterally  Neurological examination  Mentation: Alert oriented to time, place, history taking. Follows all commands speech and language fluent Cranial nerve II-XII: Pupils were equal round reactive to light. Extraocular movements were full, visual field were full on confrontational test. Facial sensation and strength were normal. Head turning and shoulder shrug  were normal and symmetric. Motor: The motor testing reveals 5 over 5 strength of all 4 extremities. Good symmetric motor tone is noted throughout.  Sensory: Sensory testing is intact to soft touch on all 4 extremities. No evidence of extinction is noted.  Coordination: Cerebellar testing reveals good finger-nose-finger and  heel-to-shin bilaterally.  Gait and station: Gait not assess, non weight bearing due to ankle fracture    DIAGNOSTIC DATA (LABS, IMAGING, TESTING) - I reviewed patient records, labs, notes, testing and imaging myself where available.  No flowsheet data found.   Lab Results  Component Value Date   WBC 8.8 05/09/2016   HGB 12.4 05/09/2016   HCT 38.0 05/09/2016   MCV 83.3 05/09/2016   PLT 370.0 05/09/2016  Component Value Date/Time   NA 138 05/09/2016 0944   K 3.6 05/09/2016 0944   CL 109 05/09/2016 0944   CO2 22 05/09/2016 0944   GLUCOSE 94 05/09/2016 0944   BUN 9 05/09/2016 0944   CREATININE 0.63 05/09/2016 0944   CREATININE 0.60 08/02/2012 1440   CALCIUM 9.3 05/09/2016 0944   PROT 7.0 05/09/2016 0944   ALBUMIN 3.8 05/09/2016 0944   AST 10 05/09/2016 0944   ALT 14 05/09/2016 0944   ALKPHOS 60 05/09/2016 0944   BILITOT 0.2 05/09/2016 0944   Lab Results  Component Value Date   CHOL 212 (H) 03/29/2015   HDL 76.90 03/29/2015   LDLCALC 116 (H) 03/29/2015   TRIG 98.0 03/29/2015   CHOLHDL 3 03/29/2015   Lab Results  Component Value Date   HGBA1C 5.4 08/02/2012   No results found for: [id number] Lab Results  Component Value Date   TSH 3.84 03/29/2015      ASSESSMENT AND PLAN 24 y.o. year old female  has a past medical history of Anxiety, Back Ware, Depression, and Migraines. here wit    ICD-10-CM   1. Migraine without aura and without status migrainosus, not intractable  G43.009     [place]man Livings is doing very well today.  She reports that migraines have been well managed for the past year.  She did discontinue propranolol and diclofenac.  She does not feel that she needs these medications.  She was advised to continue ibuprofen as needed for abortive therapy.  She was advised to avoid regular use and call me with any worsening symptoms.  She will focus on a well-balanced diet, regular exercise and adequate hydration.  She will follow-up with me as needed.  She  verbalizes understanding and agreement with this plan.   No orders of the defined types were placed in this encounter.    No orders of the defined types were placed in this encounter.     I spent 15 minutes with the patient. 50% of this time was spent counseling and educating patient on plan of care and medications.    Shawnie Dapper, FNP-C 07/17/2019, 10:28 AM [facility] [address], [address] [place], [place] [address] [phone]

## 2019-07-22 ENCOUNTER — Encounter: Payer: Self-pay | Admitting: Physical Therapy

## 2019-07-22 ENCOUNTER — Other Ambulatory Visit: Payer: Self-pay

## 2019-07-22 ENCOUNTER — Ambulatory Visit: Payer: Federal, State, Local not specified - PPO | Admitting: Family Medicine

## 2019-07-22 ENCOUNTER — Ambulatory Visit: Payer: Federal, State, Local not specified - PPO | Admitting: Physical Therapy

## 2019-07-22 DIAGNOSIS — M25572 Pain in left ankle and joints of left foot: Secondary | ICD-10-CM | POA: Diagnosis not present

## 2019-07-22 DIAGNOSIS — R600 Localized edema: Secondary | ICD-10-CM

## 2019-07-22 DIAGNOSIS — M6281 Muscle weakness (generalized): Secondary | ICD-10-CM | POA: Diagnosis not present

## 2019-07-22 DIAGNOSIS — R293 Abnormal posture: Secondary | ICD-10-CM

## 2019-07-22 DIAGNOSIS — M25672 Stiffness of left ankle, not elsewhere classified: Secondary | ICD-10-CM

## 2019-07-22 DIAGNOSIS — R2689 Other abnormalities of gait and mobility: Secondary | ICD-10-CM

## 2019-07-22 DIAGNOSIS — R2681 Unsteadiness on feet: Secondary | ICD-10-CM

## 2019-07-22 DIAGNOSIS — Z9181 History of falling: Secondary | ICD-10-CM

## 2019-07-22 NOTE — Therapy (Signed)
[place], [place], [address] Phone: [phone]   Fax:  [phone]  Physical Therapy Treatment MD note  Patient Details  Name: Kylie Ware MRN: [id number] Date of Birth: [birth year] Referring Provider (PT): Frankey Shown, MD   Encounter Date: 07/22/2019   PT End of Session - 07/22/19 1131    Visit Number 7    Number of Visits 23    Date for PT Re-Evaluation 09/05/19    Authorization Type $25 co-pay, ded $350 met, OOP max $5000, $719.13 remaining, 75 PT visit limit per year    PT Start Time 1052    PT Stop Time 1131    PT Time Calculation (min) 39 min    Activity Tolerance Patient tolerated treatment well    Behavior During Therapy WFL for tasks assessed/performed           Past Medical History:  Diagnosis Date  . Anxiety   . Back pain    pt states d/t "slipped disc"  . Depression   . Migraines     Past Surgical History:  Procedure Laterality Date  . ORIF ANKLE FRACTURE Left 05/15/2019   Procedure: OPEN REDUCTION INTERNAL FIXATION (ORIF) LEFT BIMALLEOLAR ANKLE AND SYNDESMOSIS;  Surgeon: Leandrew Koyanagi, MD;  Location: [facility];  Service: Orthopedics;  Laterality: Left;  . WISDOM TOOTH EXTRACTION      There were no vitals filed for this visit.   Subjective Assessment - 07/22/19 1053    Subjective ankle feels about the same.  doing okay.    Pertinent History anxiety, depression, migraines, back pain    Limitations Walking;Standing;House hold activities    Patient Stated Goals to be walk again, full range & function in ankle back to where it was    Currently in Pain? No/denies    Pain Onset 1 to 4 weeks ago    Pain Onset More than a month ago              [facility] PT Assessment - 07/22/19 1101      Assessment   Medical Diagnosis left ankle fracture with ORIF    Referring Provider (PT) Frankey Shown, MD    Onset Date/Surgical Date 05/15/19    Next MD Visit 6/17      AROM   Left  Ankle Dorsiflexion 4    Left Ankle Plantar Flexion 50    Left Ankle Inversion 20    Left Ankle Eversion 12      PROM   Left Ankle Dorsiflexion 6    Left Ankle Plantar Flexion 60    Left Ankle Inversion 30    Left Ankle Eversion 18                         [facility] Adult PT Treatment/Exercise - 07/22/19 1053      Exercises   Exercises Other Exercises    Other Exercises  BFR: LOP 240 mmHg, cuff to 180 mmHg, cuff size 4      Ankle Exercises: Seated   Ankle Circles/Pumps Left;20 reps   CW/CCW/pumps   Other Seated Ankle Exercises 4 way ankle 3x20 with green tband with BFR, 30 sec rest between sets    Other Seated Ankle Exercises DF c strap 30 sec x 3 Lt LE                    PT Short Term Goals - 07/22/19 1131  PT SHORT TERM GOAL #1   Title Patient demonstrates & verbalizes understanding of initial HEP. (All STGs Target Date 08/01/2019)    Time 5    Period Weeks    Status Achieved    Target Date 08/01/19      PT SHORT TERM GOAL #2   Title Left ankle PROM dorsiflexion >10* & plantarflexion >40*    Baseline 6/15: plantarflexion met, DF not met to date    Time 5    Period Weeks    Status Partially Met    Target Date 08/01/19      PT SHORT TERM GOAL #3   Title Patient ambulates 300' with crutches PWB modified independent.    Time 5    Period Weeks    Status New    Target Date 08/01/19      PT SHORT TERM GOAL #4   Title Patient negotiates stairs with single rail & crutch, ramps & curbs with crutches PWB with supervision.    Time 5    Period Weeks    Status New    Target Date 08/01/19      PT SHORT TERM GOAL #5   Title left ankle pain </= 3/10 with activities    Time 5    Period Weeks    Status Achieved    Target Date 08/01/19             PT Long Term Goals - 07/01/19 2146      PT LONG TERM GOAL #1   Title Patient verbalizes & demonstrates understanding of ongoing HEP / fitness plan. (All LTGs Target Date: 09/05/2019)    Time 10     Period Weeks    Status New    Target Date 09/05/19      PT LONG TERM GOAL #2   Title Left Ankle AROM Dorsiflexion 15*, Plantarflexion 60*    Time 10    Period Weeks    Status New    Target Date 09/05/19      PT LONG TERM GOAL #3   Title Patient ambulates >500' modified independent LRAD with full WB or WBAT in compliance with MD orders.    Time 10    Period Weeks    Status New    Target Date 09/05/19      PT LONG TERM GOAL #4   Title Patient negotiates stairs single rail, ramps & curbs modified independent LRAD with full WB or WBAT in compliance with MD orders.    Time 10    Period Weeks    Status New    Target Date 09/05/19      PT LONG TERM GOAL #5   Title Berg Balance >45/56 to indicate lower fall risk    Time 10    Period Weeks    Status New    Target Date 09/05/19      Additional Long Term Goals   Additional Long Term Goals Yes      PT LONG TERM GOAL #6   Title Patient reports left ankle pain </= 1/10 and low back pain at prior level or better.    Time 10    Period Weeks    Status New    Target Date 09/05/19                 Plan - 07/22/19 1132    Clinical Impression Statement Pt has met/partially met 3/5 STGs and overall is demonstrating progress with PT.  ROM improved and initiated BFR therapy  today for strengthening.  Pt to see MD this week and hopeful for WB progression.    Personal Factors and Comorbidities Comorbidity 3+    Comorbidities anxiety, depression, migraines, back pain    Examination-Activity Limitations Locomotion Level;Squat;Stairs;Stand;Transfers    Examination-Participation Restrictions Driving    Stability/Clinical Decision Making Evolving/Moderate complexity    Rehab Potential Good    PT Frequency --   2-3x/week   PT Duration Other (comment)   10 weeks   PT Treatment/Interventions ADLs/Self Care Home Management;Cryotherapy;Electrical Stimulation;DME Instruction;Gait training;Stair training;Functional mobility training;Therapeutic  activities;Therapeutic exercise;Balance training;Neuromuscular re-education;Patient/family education;Manual techniques;Scar mobilization;Passive range of motion;Dry needling;Vasopneumatic Device    PT Next Visit Plan see what MD says and progress mobility/ambulation if pt able to increase weight bearing, assess response to BFR and continue if indicated    PT [place] and Agree with Plan of Care Patient           Patient will benefit from skilled therapeutic intervention in order to improve the following deficits and impairments:  Abnormal gait, Decreased balance, Decreased endurance, Decreased mobility, Decreased range of motion, Decreased scar mobility, Decreased strength, Increased edema, Obesity, Pain  Visit Diagnosis: Pain in left ankle and joints of left foot  Stiffness of left ankle, not elsewhere classified  Localized edema  Muscle weakness (generalized)  Other abnormalities of gait and mobility  Unsteadiness on feet  Abnormal posture  History of fall     Problem List Patient Active Problem List   Diagnosis Date Noted  . Displaced bimalleolar fracture of left lower leg, initial encounter for closed fracture 05/15/2019  . Ankle syndesmosis disruption, left, initial encounter 05/15/2019  . Migraine without aura and without status migrainosus, not intractable 07/22/2018  . Visit for routine gyn exam 02/08/2017  . Anxiety and depression 01/20/2014  . Encounter for other general counseling or advice on contraception 01/20/2014      Laureen Abrahams, PT, DPT 07/22/19 11:34 AM     [facility] [address] [place], [place], [address] Phone: [phone]   Fax:  [phone]  Name: SAMANVITHA GERMANY MRN: [id number] Date of Birth: [birth year]

## 2019-07-24 ENCOUNTER — Ambulatory Visit (INDEPENDENT_AMBULATORY_CARE_PROVIDER_SITE_OTHER): Payer: Federal, State, Local not specified - PPO | Admitting: Orthopaedic Surgery

## 2019-07-24 ENCOUNTER — Ambulatory Visit: Payer: Self-pay

## 2019-07-24 ENCOUNTER — Encounter: Payer: Self-pay | Admitting: Orthopaedic Surgery

## 2019-07-24 VITALS — Ht 67.0 in | Wt 240.0 lb

## 2019-07-24 DIAGNOSIS — S82842A Displaced bimalleolar fracture of left lower leg, initial encounter for closed fracture: Secondary | ICD-10-CM

## 2019-07-24 MED ORDER — AMOXICILLIN-POT CLAVULANATE 875-125 MG PO TABS: 1.0000 | ORAL_TABLET | Freq: Two times a day (BID) | ORAL | 0 refills | Status: DC

## 2019-07-24 NOTE — Progress Notes (Signed)
   Post-Op Visit Note   Patient: Kylie Ware           Date of Birth: 1995/02/19           MRN: [id number] Visit Date: 07/24/2019 PCP: System, Pcp Not In   Assessment & Plan:  Chief Complaint:  Chief Complaint  Patient presents with  . Left Ankle - Follow-up    ORIF left ankle and syndesmosis DOS 05/15/2019   Visit Diagnoses:  1. Displaced bimalleolar fracture of left lower leg, initial encounter for closed fracture     Plan: Ayodele is a 10 weeks status post ORIF left ankle and syndesmosis.  She is doing well overall and not requiring pain medications.  Surgical scars are fully healed.  Her range of motion is quite good.  Minimal swelling.  No tenderness to palpation.  X-rays demonstrate healing of the fracture.  At this point we will advance her to weight-bear as tolerated with PT.  She can wean the boot as her ankles get stronger.  We provided her with an ASO brace to wean into and to use at nighttime if she needs it.  Recheck in 6 weeks with three-view x-rays of the left ankle.  Follow-Up Instructions: Return in about 6 weeks (around 09/04/2019).   Orders:  Orders Placed This Encounter  Procedures  . XR Ankle Complete Left   Meds ordered this encounter  Medications  . amoxicillin-clavulanate (AUGMENTIN) 875-125 MG tablet    Sig: Take 1 tablet by mouth 2 (two) times daily.    Dispense:  14 tablet    Refill:  0    Imaging: XR Ankle Complete Left  Result Date: 07/24/2019 Healed fibula fracture.  Intact ankle syndesmosis.  No hardware complications.   PMFS History: Patient Active Problem List   Diagnosis Date Noted  . Displaced bimalleolar fracture of left lower leg, initial encounter for closed fracture 05/15/2019  . Ankle syndesmosis disruption, left, initial encounter 05/15/2019  . Migraine without aura and without status migrainosus, not intractable 07/22/2018  . Visit for routine gyn exam 02/08/2017  . Anxiety and depression 01/20/2014  . Encounter for  other general counseling or advice on contraception 01/20/2014   Past Medical History:  Diagnosis Date  . Anxiety   . Back pain    pt states d/t "slipped disc"  . Depression   . Migraines     Family History  Problem Relation Age of Onset  . Diabetes Father   . Cancer Mother        breast  . Cancer Maternal Grandmother 81       breast CA    Past Surgical History:  Procedure Laterality Date  . ORIF ANKLE FRACTURE Left 05/15/2019   Procedure: OPEN REDUCTION INTERNAL FIXATION (ORIF) LEFT BIMALLEOLAR ANKLE AND SYNDESMOSIS;  Surgeon: Tarry Kos, MD;  Location: [facility];  Service: Orthopedics;  Laterality: Left;  . WISDOM TOOTH EXTRACTION     Social History   Occupational History  . Not on file  Tobacco Use  . Smoking status: Never Smoker  . Smokeless tobacco: Never Used  Vaping Use  . Vaping Use: Never used  Substance and Sexual Activity  . Alcohol use: No    Alcohol/week: 0.0 standard drinks  . Drug use: No  . Sexual activity: Not on file

## 2019-07-25 ENCOUNTER — Other Ambulatory Visit: Payer: Self-pay

## 2019-07-25 ENCOUNTER — Ambulatory Visit: Payer: Federal, State, Local not specified - PPO | Admitting: Physical Therapy

## 2019-07-25 ENCOUNTER — Encounter: Payer: Self-pay | Admitting: Physical Therapy

## 2019-07-25 DIAGNOSIS — M25672 Stiffness of left ankle, not elsewhere classified: Secondary | ICD-10-CM | POA: Diagnosis not present

## 2019-07-25 DIAGNOSIS — M6281 Muscle weakness (generalized): Secondary | ICD-10-CM

## 2019-07-25 DIAGNOSIS — R600 Localized edema: Secondary | ICD-10-CM

## 2019-07-25 DIAGNOSIS — M25572 Pain in left ankle and joints of left foot: Secondary | ICD-10-CM | POA: Diagnosis not present

## 2019-07-25 DIAGNOSIS — R293 Abnormal posture: Secondary | ICD-10-CM

## 2019-07-25 DIAGNOSIS — R2681 Unsteadiness on feet: Secondary | ICD-10-CM

## 2019-07-25 DIAGNOSIS — R2689 Other abnormalities of gait and mobility: Secondary | ICD-10-CM

## 2019-07-25 DIAGNOSIS — Z9181 History of falling: Secondary | ICD-10-CM

## 2019-07-25 NOTE — Therapy (Signed)
Kylie Ware, [place], [address] Phone: [phone]   Fax:  [phone]  Physical Therapy Treatment  Patient Details  Name: Kylie Ware MRN: [id number] Date of Birth: 14-Aug-1995 Referring Provider (PT): Frankey Shown, MD   Encounter Date: 07/25/2019   PT End of Session - 07/25/19 1139    Visit Number 8    Number of Visits 23    Date for PT Re-Evaluation 09/05/19    Authorization Type $25 co-pay, ded $350 met, OOP max $5000, $719.13 remaining, 75 PT visit limit per year    PT Start Time 1055    PT Stop Time 1135    PT Time Calculation (min) 40 min    Activity Tolerance Patient tolerated treatment well    Behavior During Therapy [facility] for tasks assessed/performed           Past Medical History:  Diagnosis Date  . Anxiety   . Back pain    pt states d/t "slipped disc"  . Depression   . Migraines     Past Surgical History:  Procedure Laterality Date  . ORIF ANKLE FRACTURE Left 05/15/2019   Procedure: OPEN REDUCTION INTERNAL FIXATION (ORIF) LEFT BIMALLEOLAR ANKLE AND SYNDESMOSIS;  Surgeon: Leandrew Koyanagi, MD;  Location: [place];  Service: Orthopedics;  Laterality: Left;  . WISDOM TOOTH EXTRACTION      There were no vitals filed for this visit.   Subjective Assessment - 07/25/19 1056    Subjective doing well, MD pleased with progress, now WBAT, still using knee walker    Pertinent History anxiety, depression, migraines, back pain    Limitations Walking;Standing;House hold activities    Patient Stated Goals to be walk again, full range & function in ankle back to where it was    Currently in Pain? No/denies    Pain Onset 1 to 4 weeks ago    Pain Onset More than a month ago                             [facility] Adult PT Treatment/Exercise - 07/25/19 1058      Ambulation/Gait   Ambulation/Gait Assistance 5: Supervision    Ambulation/Gait Assistance Details cues for sequencing and  weight shifting onto LLE    Ambulation Distance (Feet) 75 Feet    Assistive device Crutches;Parallel bars   // bars x 9 min   Gait Pattern Step-to pattern;Decreased stance time - left;Decreased step length - right;Right circumduction;Decreased hip/knee flexion - left    Pre-Gait Activities standing weight shifting (RLE on 2" step, LLE on scale) in // bars: lateral weight shifting 10x10 sec,  forward step weight shift 10x10 sec, back step weight shift 10 x 10 sec      Exercises   Exercises Knee/Hip      Knee/Hip Exercises: Machines for Strengthening   Cybex Knee Extension LLE only 15# x 15, then 10# 2x15    Cybex Knee Flexion LLE only 15# 3x15                    PT Short Term Goals - 07/22/19 1131      PT SHORT TERM GOAL #1   Title Patient demonstrates & verbalizes understanding of initial HEP. (All STGs Target Date 08/01/2019)    Time 5    Period Weeks    Status Achieved    Target Date 08/01/19      PT SHORT TERM GOAL #  2   Title Left ankle PROM dorsiflexion >10* & plantarflexion >40*    Baseline 6/15: plantarflexion met, DF not met to date    Time 5    Period Weeks    Status Partially Met    Target Date 08/01/19      PT SHORT TERM GOAL #3   Title Patient ambulates 300' with crutches PWB modified independent.    Time 5    Period Weeks    Status New    Target Date 08/01/19      PT SHORT TERM GOAL #4   Title Patient negotiates stairs with single rail & crutch, ramps & curbs with crutches PWB with supervision.    Time 5    Period Weeks    Status New    Target Date 08/01/19      PT SHORT TERM GOAL #5   Title left ankle pain </= 3/10 with activities    Time 5    Period Weeks    Status Achieved    Target Date 08/01/19             PT Long Term Goals - 07/01/19 2146      PT LONG TERM GOAL #1   Title Patient verbalizes & demonstrates understanding of ongoing HEP / fitness plan. (All LTGs Target Date: 09/05/2019)    Time 10    Period Weeks    Status New      Target Date 09/05/19      PT LONG TERM GOAL #2   Title Left Ankle AROM Dorsiflexion 15*, Plantarflexion 60*    Time 10    Period Weeks    Status New    Target Date 09/05/19      PT LONG TERM GOAL #3   Title Patient ambulates >500' modified independent LRAD with full WB or WBAT in compliance with MD orders.    Time 10    Period Weeks    Status New    Target Date 09/05/19      PT LONG TERM GOAL #4   Title Patient negotiates stairs single rail, ramps & curbs modified independent LRAD with full WB or WBAT in compliance with MD orders.    Time 10    Period Weeks    Status New    Target Date 09/05/19      PT LONG TERM GOAL #5   Title Berg Balance >45/56 to indicate lower fall risk    Time 10    Period Weeks    Status New    Target Date 09/05/19      Additional Long Term Goals   Additional Long Term Goals Yes      PT LONG TERM GOAL #6   Title Patient reports left ankle pain </= 1/10 and low back pain at prior level or better.    Time 10    Period Weeks    Status New    Target Date 09/05/19                 Plan - 07/25/19 1140    Clinical Impression Statement Pt now WBAT on LLE so session today focused on pre gait activites and gait with crutches working on WB through LLE.  Pt up to 110-130# with weight shifting in // bars.  Overall tolerated session well and will need continued WB activities.  Recommended she bring her shoe next session for bike and other exercises.    Personal Factors and Comorbidities Comorbidity 3+    Comorbidities  anxiety, depression, migraines, back pain    Examination-Activity Limitations Locomotion Level;Squat;Stairs;Stand;Transfers    Examination-Participation Restrictions Driving    Stability/Clinical Decision Making Evolving/Moderate complexity    Rehab Potential Good    PT Frequency --   2-3x/week   PT Duration Other (comment)   10 weeks   PT Treatment/Interventions ADLs/Self Care Home Management;Cryotherapy;Electrical Stimulation;DME  Instruction;Gait training;Stair training;Functional mobility training;Therapeutic activities;Therapeutic exercise;Balance training;Neuromuscular re-education;Patient/family education;Manual techniques;Scar mobilization;Passive range of motion;Dry needling;Vasopneumatic Device    PT Next Visit Plan continue with gait training, pre-gait activities, BFR and LLE strengthening    PT Home Exercise Plan [email or web address]    Consulted and Agree with Plan of Care Patient           Patient will benefit from skilled therapeutic intervention in order to improve the following deficits and impairments:  Abnormal gait, Decreased balance, Decreased endurance, Decreased mobility, Decreased range of motion, Decreased scar mobility, Decreased strength, Increased edema, Obesity, Pain  Visit Diagnosis: Pain in left ankle and joints of left foot  Stiffness of left ankle, not elsewhere classified  Localized edema  Muscle weakness (generalized)  Other abnormalities of gait and mobility  Unsteadiness on feet  Abnormal posture  History of fall     Problem List Patient Active Problem List   Diagnosis Date Noted  . Displaced bimalleolar fracture of left lower leg, initial encounter for closed fracture 05/15/2019  . Ankle syndesmosis disruption, left, initial encounter 05/15/2019  . Migraine without aura and without status migrainosus, not intractable 07/22/2018  . Visit for routine gyn exam 02/08/2017  . Anxiety and depression 01/20/2014  . Encounter for other general counseling or advice on contraception 01/20/2014      Laureen Abrahams, PT, DPT 07/25/19 11:44 AM     [facility] Physical Therapy [address] [place], [place], [address] Phone: [phone]   Fax:  [phone]  Name: Kylie Ware MRN: [id number] Date of Birth: 06-26-1995

## 2019-07-25 NOTE — Patient Instructions (Signed)
Access Code: [email or web address] URL: [email or web address] Date: 07/25/2019 Prepared by: Moshe Cipro  Exercises Ankle Pumps in Elevation - 2 x daily - 7 x weekly - 3 sets - 10 reps Supine Ankle Inversion Eversion AROM - 2 x daily - 7 x weekly - 3 sets - 10 reps Ankle Alphabet in Elevation - 2 x daily - 7 x weekly - 1 sets - 3 reps Long Sitting Calf Stretch with Strap - 2 x daily - 7 x weekly - 1 sets - 5 reps - 30 hold Standing Weight Shift Side to Side - 2 x daily - 7 x weekly - 1 sets - 10 reps - 10 sec hold Weight Shifts to Step with Parallel Bars (BKA) - 2 x daily - 7 x weekly - 1 sets - 10 reps - 10 sec hold

## 2019-07-29 ENCOUNTER — Encounter: Payer: Self-pay | Admitting: Physical Therapy

## 2019-07-29 ENCOUNTER — Ambulatory Visit: Payer: Federal, State, Local not specified - PPO | Admitting: Physical Therapy

## 2019-07-29 ENCOUNTER — Other Ambulatory Visit: Payer: Self-pay

## 2019-07-29 DIAGNOSIS — R2681 Unsteadiness on feet: Secondary | ICD-10-CM

## 2019-07-29 DIAGNOSIS — R293 Abnormal posture: Secondary | ICD-10-CM

## 2019-07-29 DIAGNOSIS — M25672 Stiffness of left ankle, not elsewhere classified: Secondary | ICD-10-CM

## 2019-07-29 DIAGNOSIS — R531 Weakness: Secondary | ICD-10-CM | POA: Diagnosis not present

## 2019-07-29 DIAGNOSIS — R2689 Other abnormalities of gait and mobility: Secondary | ICD-10-CM

## 2019-07-29 NOTE — Therapy (Signed)
[facility] [place], [place], [address] Phone: [phone]   Fax:  [phone]  Physical Therapy Treatment  Patient Details  Name: Kylie Ware MRN: [id number] Date of Birth: January 20, 1996 Referring Provider (PT): Frankey Shown, MD   Encounter Date: 07/29/2019   PT End of Session - 07/29/19 1106    Visit Number 9    Number of Visits 23    Date for PT Re-Evaluation 09/05/19    Authorization Type $25 co-pay, ded $350 met, OOP max $5000, $719.13 remaining, 75 PT visit limit per year    PT Start Time 1100    PT Stop Time 1145    PT Time Calculation (min) 45 min    Activity Tolerance Patient tolerated treatment well    Behavior During Therapy [facility] for tasks assessed/performed           Past Medical History:  Diagnosis Date  . Anxiety   . Back pain    pt states d/t "slipped disc"  . Depression   . Migraines     Past Surgical History:  Procedure Laterality Date  . ORIF ANKLE FRACTURE Left 05/15/2019   Procedure: OPEN REDUCTION INTERNAL FIXATION (ORIF) LEFT BIMALLEOLAR ANKLE AND SYNDESMOSIS;  Surgeon: Leandrew Koyanagi, MD;  Location: [place];  Service: Orthopedics;  Laterality: Left;  . WISDOM TOOTH EXTRACTION      There were no vitals filed for this visit.   Subjective Assessment - 07/29/19 1101    Subjective brought her shoe today, working on weight shifting at home - "it's uncomfortable"    Pertinent History anxiety, depression, migraines, back pain    Limitations Walking;Standing;House hold activities    Patient Stated Goals to be walk again, full range & function in ankle back to where it was    Currently in Pain? No/denies    Pain Onset 1 to 4 weeks ago    Pain Onset More than a month ago                             [facility] Adult PT Treatment/Exercise - 07/29/19 1100      Ambulation/Gait   Ambulation/Gait Assistance 5: Supervision    Ambulation/Gait Assistance Details tactile &  verbal cues on wt shift & step through pattern.  PT recommended using crutches for gait in/out PT instead of kneel walker    Ambulation Distance (Feet) 200 Feet    Assistive device Crutches   ankle brace + walking boot   Gait Pattern Decreased stance time - left;Decreased step length - right;Right circumduction;Decreased hip/knee flexion - left;Step-through pattern    Ambulation Surface Level;Indoor    Pre-Gait Activities standing with crutch support: stepping RLE to targets for step through motion, then LLE stepping to targets for step through motion.       Neuro Re-ed    Neuro Re-ed Details  standing balance to facilitate BLE weight bearing support: No UE support but bar & PT close supervision - EO head turns right/left, up/down & diagonals, static 30 sec EC.  Support on bar anteriorly lightly: weight shift right / midline / left & ant / midline / post.   Seated on 24" stool with BLE supported on floor: BUE rows 20# eaUE and forward punch 15# eaUE to facilitate weight bearing on LEs.       Exercises   Exercises Knee/Hip      Knee/Hip Exercises: Aerobic   Nustep level 5 with BLEs  only 5 min cues for controlled motion.. "little bit discomfort" LLE      Knee/Hip Exercises: Machines for Strengthening   Cybex Knee Extension --    Cybex Knee Flexion --                    PT Short Term Goals - 07/29/19 1254      PT SHORT TERM GOAL #1   Title Patient demonstrates & verbalizes understanding of initial HEP. (All STGs Target Date 08/01/2019)    Time 5    Period Weeks    Status Achieved    Target Date 08/01/19      PT SHORT TERM GOAL #2   Title Left ankle PROM dorsiflexion >10* & plantarflexion >40*    Baseline 6/15: plantarflexion met, DF not met to date    Time 5    Period Weeks    Status Partially Met    Target Date 08/01/19      PT SHORT TERM GOAL #3   Title Patient ambulates 300' with crutches PWB modified independent.    Baseline --    Time 5    Period Weeks    Status  On-going    Target Date 08/01/19      PT SHORT TERM GOAL #4   Title Patient negotiates stairs with single rail & crutch, ramps & curbs with crutches PWB with supervision.    Baseline --    Time 5    Period Weeks    Status On-going    Target Date 08/01/19      PT SHORT TERM GOAL #5   Title left ankle pain </= 3/10 with activities    Time 5    Period Weeks    Status Achieved    Target Date 08/01/19             PT Long Term Goals - 07/01/19 2146      PT LONG TERM GOAL #1   Title Patient verbalizes & demonstrates understanding of ongoing HEP / fitness plan. (All LTGs Target Date: 09/05/2019)    Time 10    Period Weeks    Status New    Target Date 09/05/19      PT LONG TERM GOAL #2   Title Left Ankle AROM Dorsiflexion 15*, Plantarflexion 60*    Time 10    Period Weeks    Status New    Target Date 09/05/19      PT LONG TERM GOAL #3   Title Patient ambulates >500' modified independent LRAD with full WB or WBAT in compliance with MD orders.    Time 10    Period Weeks    Status New    Target Date 09/05/19      PT LONG TERM GOAL #4   Title Patient negotiates stairs single rail, ramps & curbs modified independent LRAD with full WB or WBAT in compliance with MD orders.    Time 10    Period Weeks    Status New    Target Date 09/05/19      PT LONG TERM GOAL #5   Title Berg Balance >45/56 to indicate lower fall risk    Time 10    Period Weeks    Status New    Target Date 09/05/19      Additional Long Term Goals   Additional Long Term Goals Yes      PT LONG TERM GOAL #6   Title Patient reports left ankle pain </= 1/10 and  low back pain at prior level or better.    Time 10    Period Weeks    Status New    Target Date 09/05/19                 Plan - 07/29/19 1259    Clinical Impression Statement PT session performed Nustep & stationary standing balance activities to facilitate weight bearing on left leg. PT progressed gait with PWB on LLE with crutches to  step through pattern today. She requires Walking Boot still due to fear & "slight discomfort" in ankle.    Personal Factors and Comorbidities Comorbidity 3+    Comorbidities anxiety, depression, migraines, back pain    Examination-Activity Limitations Locomotion Level;Squat;Stairs;Stand;Transfers    Examination-Participation Restrictions Driving    Stability/Clinical Decision Making Evolving/Moderate complexity    Rehab Potential Good    PT Frequency --   2-3x/week   PT Duration Other (comment)   10 weeks   PT Treatment/Interventions ADLs/Self Care Home Management;Cryotherapy;Electrical Stimulation;DME Instruction;Gait training;Stair training;Functional mobility training;Therapeutic activities;Therapeutic exercise;Balance training;Neuromuscular re-education;Patient/family education;Manual techniques;Scar mobilization;Passive range of motion;Dry needling;Vasopneumatic Device    PT Next Visit Plan continue with gait training, pre-gait activities, BFR and LLE strengthening    PT Home Exercise Plan [email or web address]    Consulted and Agree with Plan of Care Patient           Patient will benefit from skilled therapeutic intervention in order to improve the following deficits and impairments:  Abnormal gait, Decreased balance, Decreased endurance, Decreased mobility, Decreased range of motion, Decreased scar mobility, Decreased strength, Increased edema, Obesity, Pain  Visit Diagnosis: Other abnormalities of gait and mobility  Unsteadiness on feet  Abnormal posture  Weakness generalized  Stiffness of left ankle, not elsewhere classified     Problem List Patient Active Problem List   Diagnosis Date Noted  . Displaced bimalleolar fracture of left lower leg, initial encounter for closed fracture 05/15/2019  . Ankle syndesmosis disruption, left, initial encounter 05/15/2019  . Migraine without aura and without status migrainosus, not intractable 07/22/2018  . Visit for routine gyn exam  02/08/2017  . Anxiety and depression 01/20/2014  . Encounter for other general counseling or advice on contraception 01/20/2014    Jamey Reas  PT, DPT 07/29/2019, 2:55 PM  [facility] Physical Therapy [address] [place], [place], [address] Phone: [phone]   Fax:  [phone]  Name: Kylie Ware MRN: [id number] Date of Birth: January 12, 1996

## 2019-07-31 ENCOUNTER — Encounter: Payer: Self-pay | Admitting: Physical Therapy

## 2019-07-31 ENCOUNTER — Other Ambulatory Visit: Payer: Self-pay

## 2019-07-31 ENCOUNTER — Ambulatory Visit: Payer: Federal, State, Local not specified - PPO | Admitting: Physical Therapy

## 2019-07-31 DIAGNOSIS — R2681 Unsteadiness on feet: Secondary | ICD-10-CM

## 2019-07-31 DIAGNOSIS — M25672 Stiffness of left ankle, not elsewhere classified: Secondary | ICD-10-CM | POA: Diagnosis not present

## 2019-07-31 DIAGNOSIS — R600 Localized edema: Secondary | ICD-10-CM

## 2019-07-31 DIAGNOSIS — R2689 Other abnormalities of gait and mobility: Secondary | ICD-10-CM | POA: Diagnosis not present

## 2019-07-31 DIAGNOSIS — R293 Abnormal posture: Secondary | ICD-10-CM | POA: Diagnosis not present

## 2019-07-31 DIAGNOSIS — M6281 Muscle weakness (generalized): Secondary | ICD-10-CM

## 2019-07-31 DIAGNOSIS — M25572 Pain in left ankle and joints of left foot: Secondary | ICD-10-CM

## 2019-07-31 NOTE — Therapy (Signed)
[place], [place], [address] Phone: [phone]   Fax:  [phone]  Physical Therapy Treatment  Patient Details  Name: Kylie Ware MRN: [id number] Date of Birth: [birth year] Referring Provider (PT): Frankey Shown, MD   Encounter Date: 07/31/2019   PT End of Session - 07/31/19 1247    Visit Number 10    Number of Visits 23    Date for PT Re-Evaluation 09/05/19    Authorization Type $25 co-pay, ded $350 met, OOP max $5000, $719.13 remaining, 75 PT visit limit per year    PT Start Time 1145    PT Stop Time 1225    PT Time Calculation (min) 40 min    Activity Tolerance Patient tolerated treatment well    Behavior During Therapy [facility] for tasks assessed/performed           Past Medical History:  Diagnosis Date  . Anxiety   . Back pain    pt states d/t "slipped disc"  . Depression   . Migraines     Past Surgical History:  Procedure Laterality Date  . ORIF ANKLE FRACTURE Left 05/15/2019   Procedure: OPEN REDUCTION INTERNAL FIXATION (ORIF) LEFT BIMALLEOLAR ANKLE AND SYNDESMOSIS;  Surgeon: Leandrew Koyanagi, MD;  Location: [place];  Service: Orthopedics;  Laterality: Left;  . WISDOM TOOTH EXTRACTION      There were no vitals filed for this visit.   Subjective Assessment - 07/31/19 1145    Subjective came to Pt today with crutches - no knee scooter.  overall doing well without pain    Pertinent History anxiety, depression, migraines, back pain    Limitations Walking;Standing;House hold activities    Patient Stated Goals to be walk again, full range & function in ankle back to where it was    Currently in Pain? No/denies    Pain Onset 1 to 4 weeks ago    Pain Onset More than a month ago                             [facility] Adult PT Treatment/Exercise - 07/31/19 1147      Ambulation/Gait   Ambulation/Gait Assistance 5: Supervision    Ambulation/Gait Assistance Details min cues  for sequencing    Ambulation Distance (Feet) 150 Feet    Assistive device R Axillary Crutch    Gait Comments improved step length with single Rt axillary crutch, amb without crutches x 40' with supervision - decreased stance on LLE noted      Knee/Hip Exercises: Aerobic   Nustep L5 x 6 min - working on keeping Lt heel down      Ankle Exercises: Stretches   Soleus Stretch 3 reps;30 seconds   Lt   Gastroc Stretch 3 reps;30 seconds   Lt   Slant Board Stretch --   attempted - unable to step up onto board     Ankle Exercises: Standing   Heel Raises Both;10 reps   in shoe and ASO   Toe Raise 10 reps   bil   Other Standing Ankle Exercises in shoe and ASO: lateral weight shift 20 x 5 sec hold bil                    PT Short Term Goals - 07/31/19 1249      PT SHORT TERM GOAL #1   Title Patient demonstrates & verbalizes understanding of initial HEP. (All STGs  Target Date 08/01/2019)    Time 5    Period Weeks    Status Achieved    Target Date 08/01/19      PT SHORT TERM GOAL #2   Title Left ankle PROM dorsiflexion >10* & plantarflexion >40*    Baseline 6/15: plantarflexion met, DF not met to date    Time 5    Period Weeks    Status Partially Met    Target Date 08/01/19      PT SHORT TERM GOAL #3   Title Patient ambulates 300' with crutches PWB modified independent.    Time 5    Period Weeks    Status Achieved    Target Date 08/01/19      PT SHORT TERM GOAL #4   Title Patient negotiates stairs with single rail & crutch, ramps & curbs with crutches PWB with supervision.    Time 5    Period Weeks    Status On-going    Target Date 08/01/19      PT SHORT TERM GOAL #5   Title left ankle pain </= 3/10 with activities    Time 5    Period Weeks    Status Achieved    Target Date 08/01/19             PT Long Term Goals - 07/01/19 2146      PT LONG TERM GOAL #1   Title Patient verbalizes & demonstrates understanding of ongoing HEP / fitness plan. (All LTGs Target  Date: 09/05/2019)    Time 10    Period Weeks    Status New    Target Date 09/05/19      PT LONG TERM GOAL #2   Title Left Ankle AROM Dorsiflexion 15*, Plantarflexion 60*    Time 10    Period Weeks    Status New    Target Date 09/05/19      PT LONG TERM GOAL #3   Title Patient ambulates >500' modified independent LRAD with full WB or WBAT in compliance with MD orders.    Time 10    Period Weeks    Status New    Target Date 09/05/19      PT LONG TERM GOAL #4   Title Patient negotiates stairs single rail, ramps & curbs modified independent LRAD with full WB or WBAT in compliance with MD orders.    Time 10    Period Weeks    Status New    Target Date 09/05/19      PT LONG TERM GOAL #5   Title Berg Balance >45/56 to indicate lower fall risk    Time 10    Period Weeks    Status New    Target Date 09/05/19      Additional Long Term Goals   Additional Long Term Goals Yes      PT LONG TERM GOAL #6   Title Patient reports left ankle pain </= 1/10 and low back pain at prior level or better.    Time 10    Period Weeks    Status New    Target Date 09/05/19                 Plan - 07/31/19 1248    Clinical Impression Statement Pt demonstrating great progress with gait, and safely demonstrated ability to amb with single axillary crutch today.  Hopeful to progress to no crutches and working out of boot in next few weeks.    Personal Factors  and Comorbidities Comorbidity 3+    Comorbidities anxiety, depression, migraines, back pain    Examination-Activity Limitations Locomotion Level;Squat;Stairs;Stand;Transfers    Examination-Participation Restrictions Driving    Stability/Clinical Decision Making Evolving/Moderate complexity    Rehab Potential Good    PT Frequency --   2-3x/week   PT Duration Other (comment)   10 weeks   PT Treatment/Interventions ADLs/Self Care Home Management;Cryotherapy;Electrical Stimulation;DME Instruction;Gait training;Stair training;Functional  mobility training;Therapeutic activities;Therapeutic exercise;Balance training;Neuromuscular re-education;Patient/family education;Manual techniques;Scar mobilization;Passive range of motion;Dry needling;Vasopneumatic Device    PT Next Visit Plan continue with gait training, pre-gait activities, BFR and LLE strengthening; look at [place] and Agree with Plan of Care Patient           Patient will benefit from skilled therapeutic intervention in order to improve the following deficits and impairments:  Abnormal gait, Decreased balance, Decreased endurance, Decreased mobility, Decreased range of motion, Decreased scar mobility, Decreased strength, Increased edema, Obesity, Pain  Visit Diagnosis: Other abnormalities of gait and mobility  Unsteadiness on feet  Abnormal posture  Stiffness of left ankle, not elsewhere classified  Pain in left ankle and joints of left foot  Localized edema  Muscle weakness (generalized)     Problem List Patient Active Problem List   Diagnosis Date Noted  . Displaced bimalleolar fracture of left lower leg, initial encounter for closed fracture 05/15/2019  . Ankle syndesmosis disruption, left, initial encounter 05/15/2019  . Migraine without aura and without status migrainosus, not intractable 07/22/2018  . Visit for routine gyn exam 02/08/2017  . Anxiety and depression 01/20/2014  . Encounter for other general counseling or advice on contraception 01/20/2014     Laureen Abrahams, PT, DPT 07/31/19 12:50 PM     [facility] Physical Therapy [address] [place], [place], [address] Phone: [phone]   Fax:  [phone]  Name: Kylie Ware MRN: [id number] Date of Birth: 03-02-1995

## 2019-08-05 ENCOUNTER — Encounter: Payer: Self-pay | Admitting: Physical Therapy

## 2019-08-05 ENCOUNTER — Other Ambulatory Visit: Payer: Self-pay

## 2019-08-05 ENCOUNTER — Ambulatory Visit: Payer: Federal, State, Local not specified - PPO | Admitting: Physical Therapy

## 2019-08-05 DIAGNOSIS — R2689 Other abnormalities of gait and mobility: Secondary | ICD-10-CM | POA: Diagnosis not present

## 2019-08-05 DIAGNOSIS — M25572 Pain in left ankle and joints of left foot: Secondary | ICD-10-CM

## 2019-08-05 DIAGNOSIS — Z9181 History of falling: Secondary | ICD-10-CM

## 2019-08-05 DIAGNOSIS — M25672 Stiffness of left ankle, not elsewhere classified: Secondary | ICD-10-CM

## 2019-08-05 DIAGNOSIS — R600 Localized edema: Secondary | ICD-10-CM

## 2019-08-05 DIAGNOSIS — R531 Weakness: Secondary | ICD-10-CM

## 2019-08-05 DIAGNOSIS — R2681 Unsteadiness on feet: Secondary | ICD-10-CM

## 2019-08-05 DIAGNOSIS — R293 Abnormal posture: Secondary | ICD-10-CM

## 2019-08-05 NOTE — Therapy (Signed)
[place], [place], [address] Phone: [phone]   Fax:  [phone]  Physical Therapy Treatment  Patient Details  Name: Kylie Ware MRN: [id number] Date of Birth: 11-Aug-1995 Referring Provider (PT): Frankey Shown, MD   Encounter Date: 08/05/2019   PT End of Session - 08/05/19 1257    Visit Number 11    Number of Visits 23    Date for PT Re-Evaluation 09/05/19    Authorization Type $25 co-pay, ded $350 met, OOP max $5000, $719.13 remaining, 75 PT visit limit per year    PT Start Time 1100    PT Stop Time 1144    PT Time Calculation (min) 44 min    Activity Tolerance Patient tolerated treatment well    Behavior During Therapy [facility] for tasks assessed/performed           Past Medical History:  Diagnosis Date  . Anxiety   . Back pain    pt states d/t "slipped disc"  . Depression   . Migraines     Past Surgical History:  Procedure Laterality Date  . ORIF ANKLE FRACTURE Left 05/15/2019   Procedure: OPEN REDUCTION INTERNAL FIXATION (ORIF) LEFT BIMALLEOLAR ANKLE AND SYNDESMOSIS;  Surgeon: Leandrew Koyanagi, MD;  Location: [place];  Service: Orthopedics;  Laterality: Left;  . WISDOM TOOTH EXTRACTION      There were no vitals filed for this visit.   Subjective Assessment - 08/05/19 1100    Subjective She is using single crutch in home & 2 crutches in community without issues. She is not driving herself again yet.    Pertinent History anxiety, depression, migraines, back pain    Limitations Walking;Standing;House hold activities    How long can you stand comfortably? 15 minutes    Patient Stated Goals to be walk again, full range & function in ankle back to where it was    Pain Score 1     Pain Location Ankle    Pain Orientation Left    Pain Descriptors / Indicators Aching;Burning    Pain Onset More than a month ago    Pain Frequency Constant    Aggravating Factors  using it & standing on it too  long.    Pain Relieving Factors sitting to rest    Pain Onset More than a month ago                             [facility] Adult PT Treatment/Exercise - 08/05/19 1100      Transfers   Sit to Stand 6: Modified independent (Device/Increase time);Without upper extremity assist;From chair/3-in-1    Stand to Sit 6: Modified independent (Device/Increase time);Without upper extremity assist;To chair/3-in-1      Ambulation/Gait   Ambulation/Gait Yes    Ambulation/Gait Assistance 5: Supervision    Ambulation/Gait Assistance Details no walking boot on left ankle - ASO brace only - progressed from 2 crutches to single crutch to no device for short distance.  PT recommended not using Walking Boot outside of PT unless ankle becomes sore then wear for short recovery period and progressively increase time out of boot. pt verbalized understanding    Ambulation Distance (Feet) 150 Feet   150 single crutch & 15' no device   Assistive device R Axillary Crutch;None;Crutches    Ambulation Surface Level;Indoor    Ramp 5: Supervision   2 crutches & ASO brace   Ramp Details (indicate cue  type and reason) cues on sequence & wt shift    Curb 5: Supervision   crutches & ASO brace   Curb Details (indicate cue type and reason) verbal cues on sequence    Gait Comments --      High Level Balance   High Level Balance Activities Side stepping;Backward walking;Direction changes;Tandem walking    High Level Balance Comments in//bars decreasing UE support - PT demo, verbal & visual mirror for technique      Neuro Re-ed    Neuro Re-ed Details  standing balance to facilitate BLE weight bearing support: No UE support but bar & PT close supervision - EO head turns right/left, up/down & diagonals, static 30 sec EC.  Support on bar anteriorly lightly: weight shift right / midline / left & ant / midline / post.   Seated on 24" stool with BLE supported on floor: BUE rows 20# eaUE and forward punch 15# eaUE to  facilitate weight bearing on LEs.       Knee/Hip Exercises: Stretches   Press photographer Left;3 reps;20 seconds   slant board   Gastroc Stretch Limitations 1st rep straight forward wt shift, 2nd rep with medial anterior shift & 3rd rep with lateral anterior shift      Knee/Hip Exercises: Aerobic   Nustep L7 BLEs only x 9 min - working on keeping Lt heel down      Ankle Exercises: Standing   BAPS --    BAPS Limitations --    Rocker Board 1 minute    Rocker Board Limitations anterior/posterior & medial /lateral motions    Heel Raises Both;10 reps   in shoe and ASO   Toe Raise 10 reps   bil   Other Standing Ankle Exercises in shoe and ASO: lateral weight shift 20 x 5 sec hold bil      Ankle Exercises: Stretches   Soleus Stretch 3 reps;30 seconds   Lt   Gastroc Stretch 3 reps;30 seconds   Lt   Slant Board Stretch 3 reps;20 seconds    Other Stretch toe stretches extension & flexion 10 seconds Left foot all toes 2 reps each.       Ankle Exercises: Seated   Heel Raises Left;10 reps;5 seconds    Heel Raises Limitations self resistance BUEs pressing downward on knee    BAPS Sitting;Level 2;10 reps    BAPS Limitations circular motions CW & CCW                    PT Short Term Goals - 08/05/19 1258      PT SHORT TERM GOAL #1   Title Patient demonstrates & verbalizes understanding of initial HEP. (All STGs Target Date 08/01/2019)    Time 5    Period Weeks    Status Achieved    Target Date 08/01/19      PT SHORT TERM GOAL #2   Title Left ankle PROM dorsiflexion >10* & plantarflexion >40*    Baseline 6/15: plantarflexion met, DF not met to date    Time 5    Period Weeks    Status Partially Met    Target Date 08/01/19      PT SHORT TERM GOAL #3   Title Patient ambulates 300' with crutches PWB modified independent.    Time 5    Period Weeks    Status Achieved    Target Date 08/01/19      PT SHORT TERM GOAL #4   Title Patient negotiates stairs  with single rail &  crutch, ramps & curbs with crutches PWB with supervision.    Baseline MET 08/05/2019    Time 5    Period Weeks    Status Achieved    Target Date 08/01/19      PT SHORT TERM GOAL #5   Title left ankle pain </= 3/10 with activities    Time 5    Period Weeks    Status Achieved    Target Date 08/01/19             PT Long Term Goals - 07/01/19 2146      PT LONG TERM GOAL #1   Title Patient verbalizes & demonstrates understanding of ongoing HEP / fitness plan. (All LTGs Target Date: 09/05/2019)    Time 10    Period Weeks    Status New    Target Date 09/05/19      PT LONG TERM GOAL #2   Title Left Ankle AROM Dorsiflexion 15*, Plantarflexion 60*    Time 10    Period Weeks    Status New    Target Date 09/05/19      PT LONG TERM GOAL #3   Title Patient ambulates >500' modified independent LRAD with full WB or WBAT in compliance with MD orders.    Time 10    Period Weeks    Status New    Target Date 09/05/19      PT LONG TERM GOAL #4   Title Patient negotiates stairs single rail, ramps & curbs modified independent LRAD with full WB or WBAT in compliance with MD orders.    Time 10    Period Weeks    Status New    Target Date 09/05/19      PT LONG TERM GOAL #5   Title Berg Balance >45/56 to indicate lower fall risk    Time 10    Period Weeks    Status New    Target Date 09/05/19      Additional Long Term Goals   Additional Long Term Goals Yes      PT LONG TERM GOAL #6   Title Patient reports left ankle pain </= 1/10 and low back pain at prior level or better.    Time 10    Period Weeks    Status New    Target Date 09/05/19                 Plan - 08/05/19 1258    Clinical Impression Statement PT session progressed gait to ASO brace only & no Walking Boot. Patient reported that it actually felt better than walking with boot.  PT also instructed in progressive weight bearing activities including motion with some pronation / supination.    Personal Factors  and Comorbidities Comorbidity 3+    Comorbidities anxiety, depression, migraines, back pain    Examination-Activity Limitations Locomotion Level;Squat;Stairs;Stand;Transfers    Examination-Participation Restrictions Driving    Stability/Clinical Decision Making Evolving/Moderate complexity    Rehab Potential Good    PT Frequency --   2-3x/week   PT Duration Other (comment)   10 weeks   PT Treatment/Interventions ADLs/Self Care Home Management;Cryotherapy;Electrical Stimulation;DME Instruction;Gait training;Stair training;Functional mobility training;Therapeutic activities;Therapeutic exercise;Balance training;Neuromuscular re-education;Patient/family education;Manual techniques;Scar mobilization;Passive range of motion;Dry needling;Vasopneumatic Device    PT Next Visit Plan work towards [place], continue with gait training, pre-gait activities, BFR and LLE strengthening;    PT [place] and Agree with Plan of Care Patient  Patient will benefit from skilled therapeutic intervention in order to improve the following deficits and impairments:  Abnormal gait, Decreased balance, Decreased endurance, Decreased mobility, Decreased range of motion, Decreased scar mobility, Decreased strength, Increased edema, Obesity, Pain  Visit Diagnosis: Other abnormalities of gait and mobility  Unsteadiness on feet  Abnormal posture  Weakness generalized  History of fall  Pain in left ankle and joints of left foot  Localized edema  Stiffness of left ankle, not elsewhere classified     Problem List Patient Active Problem List   Diagnosis Date Noted  . Displaced bimalleolar fracture of left lower leg, initial encounter for closed fracture 05/15/2019  . Ankle syndesmosis disruption, left, initial encounter 05/15/2019  . Migraine without aura and without status migrainosus, not intractable 07/22/2018  . Visit for routine gyn exam 02/08/2017  . Anxiety and  depression 01/20/2014  . Encounter for other general counseling or advice on contraception 01/20/2014    Jamey Reas PT, DPT 08/05/2019, 1:05 PM  [facility] Physical Therapy [address] [place], [place], [address] Phone: [phone]   Fax:  [phone]  Name: Kylie Ware MRN: [id number] Date of Birth: 1995/12/09

## 2019-08-07 ENCOUNTER — Other Ambulatory Visit: Payer: Self-pay

## 2019-08-07 ENCOUNTER — Encounter: Payer: Self-pay | Admitting: Physical Therapy

## 2019-08-07 ENCOUNTER — Ambulatory Visit: Payer: Federal, State, Local not specified - PPO | Admitting: Physical Therapy

## 2019-08-07 DIAGNOSIS — Z9181 History of falling: Secondary | ICD-10-CM

## 2019-08-07 DIAGNOSIS — M6281 Muscle weakness (generalized): Secondary | ICD-10-CM

## 2019-08-07 DIAGNOSIS — R2681 Unsteadiness on feet: Secondary | ICD-10-CM

## 2019-08-07 DIAGNOSIS — R293 Abnormal posture: Secondary | ICD-10-CM | POA: Diagnosis not present

## 2019-08-07 DIAGNOSIS — R2689 Other abnormalities of gait and mobility: Secondary | ICD-10-CM | POA: Diagnosis not present

## 2019-08-07 DIAGNOSIS — M25672 Stiffness of left ankle, not elsewhere classified: Secondary | ICD-10-CM

## 2019-08-07 DIAGNOSIS — R600 Localized edema: Secondary | ICD-10-CM

## 2019-08-07 DIAGNOSIS — M25572 Pain in left ankle and joints of left foot: Secondary | ICD-10-CM

## 2019-08-07 NOTE — Therapy (Signed)
[place], [place], [address] Phone: [phone]   Fax:  [phone]  Physical Therapy Treatment  Patient Details  Name: Kylie Ware MRN: [id number] Date of Birth: 13-Nov-1995 Referring Provider (PT): Frankey Shown, MD   Encounter Date: 08/07/2019   PT End of Session - 08/07/19 1252    Visit Number 12    Number of Visits 23    Date for PT Re-Evaluation 09/05/19    Authorization Type $25 co-pay, ded $350 met, OOP max $5000, $719.13 remaining, 75 PT visit limit per year    PT Start Time 1147    PT Stop Time 1230    PT Time Calculation (min) 43 min    Activity Tolerance Patient tolerated treatment well    Behavior During Therapy [facility] for tasks assessed/performed           Past Medical History:  Diagnosis Date  . Anxiety   . Back pain    pt states d/t "slipped disc"  . Depression   . Migraines     Past Surgical History:  Procedure Laterality Date  . ORIF ANKLE FRACTURE Left 05/15/2019   Procedure: OPEN REDUCTION INTERNAL FIXATION (ORIF) LEFT BIMALLEOLAR ANKLE AND SYNDESMOSIS;  Surgeon: Leandrew Koyanagi, MD;  Location: [place];  Service: Orthopedics;  Laterality: Left;  . WISDOM TOOTH EXTRACTION      There were no vitals filed for this visit.   Subjective Assessment - 08/07/19 1149    Subjective walking today with ASO and single crutch.  ankle is sore.    Pertinent History anxiety, depression, migraines, back pain    Limitations Walking;Standing;House hold activities    How long can you stand comfortably? 15 minutes    Patient Stated Goals to be walk again, full range & function in ankle back to where it was    Currently in Pain? Yes    Pain Score 4     Pain Location Ankle    Pain Orientation Left    Pain Descriptors / Indicators Sharp;Sore    Pain Type Acute pain;Surgical pain    Pain Onset More than a month ago    Pain Frequency Constant    Aggravating Factors  using her ankle more     Pain Relieving Factors sitting to rest    Pain Onset More than a month ago                             [facility] Adult PT Treatment/Exercise - 08/07/19 1151      Ambulation/Gait   Gait Comments amb without crutches with ASO - decreased dorsiflexion and push off on Rt.      Knee/Hip Exercises: Aerobic   Nustep L7 BLEs only x 8 min - working on keeping Lt heel down      Knee/Hip Exercises: Machines for Strengthening   Cybex Knee Extension LLE only 10# 3x10    Cybex Knee Flexion LLE only 15# 3x10      Knee/Hip Exercises: Standing   Lateral Step Up Both;20 reps;Hand Hold: 1;Step Height: 2"   foam   Forward Step Up Both;20 reps;Hand Hold: 1;Step Height: 2"   foam   Other Standing Knee Exercises lateral step crossover onto 2" foam x 10 bil; bil UE support      Ankle Exercises: Stretches   Slant Board Stretch 3 reps;30 seconds   with bent and extended knee     Ankle Exercises: Standing  SLS LLE on foam 5x30 sec with intermittent UE support    Heel Raises --   3x10 on slant board   Other Standing Ankle Exercises staggered stance calf raise in // bars x 20 reps on Lt                    PT Short Term Goals - 08/05/19 1258      PT SHORT TERM GOAL #1   Title Patient demonstrates & verbalizes understanding of initial HEP. (All STGs Target Date 08/01/2019)    Time 5    Period Weeks    Status Achieved    Target Date 08/01/19      PT SHORT TERM GOAL #2   Title Left ankle PROM dorsiflexion >10* & plantarflexion >40*    Baseline 6/15: plantarflexion met, DF not met to date    Time 5    Period Weeks    Status Partially Met    Target Date 08/01/19      PT SHORT TERM GOAL #3   Title Patient ambulates 300' with crutches PWB modified independent.    Time 5    Period Weeks    Status Achieved    Target Date 08/01/19      PT SHORT TERM GOAL #4   Title Patient negotiates stairs with single rail & crutch, ramps & curbs with crutches PWB with supervision.     Baseline MET 08/05/2019    Time 5    Period Weeks    Status Achieved    Target Date 08/01/19      PT SHORT TERM GOAL #5   Title left ankle pain </= 3/10 with activities    Time 5    Period Weeks    Status Achieved    Target Date 08/01/19             PT Long Term Goals - 07/01/19 2146      PT LONG TERM GOAL #1   Title Patient verbalizes & demonstrates understanding of ongoing HEP / fitness plan. (All LTGs Target Date: 09/05/2019)    Time 10    Period Weeks    Status New    Target Date 09/05/19      PT LONG TERM GOAL #2   Title Left Ankle AROM Dorsiflexion 15*, Plantarflexion 60*    Time 10    Period Weeks    Status New    Target Date 09/05/19      PT LONG TERM GOAL #3   Title Patient ambulates >500' modified independent LRAD with full WB or WBAT in compliance with MD orders.    Time 10    Period Weeks    Status New    Target Date 09/05/19      PT LONG TERM GOAL #4   Title Patient negotiates stairs single rail, ramps & curbs modified independent LRAD with full WB or WBAT in compliance with MD orders.    Time 10    Period Weeks    Status New    Target Date 09/05/19      PT LONG TERM GOAL #5   Title Berg Balance >45/56 to indicate lower fall risk    Time 10    Period Weeks    Status New    Target Date 09/05/19      Additional Long Term Goals   Additional Long Term Goals Yes      PT LONG TERM GOAL #6   Title Patient reports left ankle pain </=  1/10 and low back pain at prior level or better.    Time 10    Period Weeks    Status New    Target Date 09/05/19                 Plan - 08/07/19 1253    Clinical Impression Statement Pt continues to demonstrate progress with PT, amb now with ASO and single crutch and working to decrease reliance on crutch.  Still very weak LLE due to NWB for extended time, and will continue to benefit from PT to maximize function.    Personal Factors and Comorbidities Comorbidity 3+    Comorbidities anxiety, depression,  migraines, back pain    Examination-Activity Limitations Locomotion Level;Squat;Stairs;Stand;Transfers    Examination-Participation Restrictions Driving    Stability/Clinical Decision Making Evolving/Moderate complexity    Rehab Potential Good    PT Frequency --   2-3x/week   PT Duration Other (comment)   10 weeks   PT Treatment/Interventions ADLs/Self Care Home Management;Cryotherapy;Electrical Stimulation;DME Instruction;Gait training;Stair training;Functional mobility training;Therapeutic activities;Therapeutic exercise;Balance training;Neuromuscular re-education;Patient/family education;Manual techniques;Scar mobilization;Passive range of motion;Dry needling;Vasopneumatic Device    PT Next Visit Plan do BERG, continue with gait training, pre-gait activities, BFR and LLE strengthening;    PT [place] and Agree with Plan of Care Patient           Patient will benefit from skilled therapeutic intervention in order to improve the following deficits and impairments:  Abnormal gait, Decreased balance, Decreased endurance, Decreased mobility, Decreased range of motion, Decreased scar mobility, Decreased strength, Increased edema, Obesity, Pain  Visit Diagnosis: Other abnormalities of gait and mobility  Unsteadiness on feet  Abnormal posture  Muscle weakness (generalized)  Stiffness of left ankle, not elsewhere classified  Localized edema  Pain in left ankle and joints of left foot  History of fall     Problem List Patient Active Problem List   Diagnosis Date Noted  . Displaced bimalleolar fracture of left lower leg, initial encounter for closed fracture 05/15/2019  . Ankle syndesmosis disruption, left, initial encounter 05/15/2019  . Migraine without aura and without status migrainosus, not intractable 07/22/2018  . Visit for routine gyn exam 02/08/2017  . Anxiety and depression 01/20/2014  . Encounter for other general counseling or advice  on contraception 01/20/2014    Faustino Congress 08/07/2019, 12:59 PM  [facility] Physical Therapy [address] [place], [place], [address] Phone: [phone]   Fax:  [phone]  Name: Kylie Ware MRN: [id number] Date of Birth: 1995/02/15

## 2019-08-12 ENCOUNTER — Other Ambulatory Visit: Payer: Self-pay

## 2019-08-12 ENCOUNTER — Ambulatory Visit: Payer: Federal, State, Local not specified - PPO | Admitting: Physical Therapy

## 2019-08-12 ENCOUNTER — Encounter: Payer: Self-pay | Admitting: Physical Therapy

## 2019-08-12 DIAGNOSIS — R2681 Unsteadiness on feet: Secondary | ICD-10-CM | POA: Diagnosis not present

## 2019-08-12 DIAGNOSIS — M6281 Muscle weakness (generalized): Secondary | ICD-10-CM | POA: Diagnosis not present

## 2019-08-12 DIAGNOSIS — R2689 Other abnormalities of gait and mobility: Secondary | ICD-10-CM | POA: Diagnosis not present

## 2019-08-12 DIAGNOSIS — M25672 Stiffness of left ankle, not elsewhere classified: Secondary | ICD-10-CM

## 2019-08-12 DIAGNOSIS — R293 Abnormal posture: Secondary | ICD-10-CM

## 2019-08-12 DIAGNOSIS — R600 Localized edema: Secondary | ICD-10-CM

## 2019-08-12 DIAGNOSIS — M25572 Pain in left ankle and joints of left foot: Secondary | ICD-10-CM

## 2019-08-12 DIAGNOSIS — Z9181 History of falling: Secondary | ICD-10-CM

## 2019-08-12 NOTE — Therapy (Signed)
[facility] [address] [place], [place], [address] Phone: [phone]   Fax:  [phone]  Physical Therapy Treatment  Patient Details  Name: Kylie Ware MRN: [id number] Date of Birth: [birth year] Referring Provider (PT): Frankey Shown, MD   Encounter Date: 08/12/2019   PT End of Session - 08/12/19 0841    Visit Number 13    Number of Visits 23    Date for PT Re-Evaluation 09/05/19    Authorization Type $25 co-pay, ded $350 met, OOP max $5000, $719.13 remaining, 54 PT visit limit per year    PT Start Time 0808   pt arrived late   PT Stop Time 0841    PT Time Calculation (min) 33 min    Activity Tolerance Patient tolerated treatment well    Behavior During Therapy [facility] for tasks assessed/performed           Past Medical History:  Diagnosis Date   Anxiety    Back pain    pt states d/t "slipped disc"   Depression    Migraines     Past Surgical History:  Procedure Laterality Date   ORIF ANKLE FRACTURE Left 05/15/2019   Procedure: OPEN REDUCTION INTERNAL FIXATION (ORIF) LEFT BIMALLEOLAR ANKLE AND SYNDESMOSIS;  Surgeon: Leandrew Koyanagi, MD;  Location: [place];  Service: Orthopedics;  Laterality: Left;   WISDOM TOOTH EXTRACTION      There were no vitals filed for this visit.   Subjective Assessment - 08/12/19 0811    Subjective doing well; ankle is doing well, no pain or soreness    Pertinent History anxiety, depression, migraines, back pain    Limitations Walking;Standing;House hold activities    How long can you stand comfortably? 15 minutes    Patient Stated Goals to be walk again, full range & function in ankle back to where it was    Currently in Pain? No/denies    Pain Onset More than a month ago    Pain Onset More than a month ago              [facility] PT Assessment - 08/12/19 0811      Assessment   Medical Diagnosis left ankle fracture with ORIF    Referring Provider (PT) Frankey Shown, MD     Onset Date/Surgical Date 05/15/19    Hand Dominance Right    Next MD Visit 6/17      AROM   Left Ankle Dorsiflexion 5    Left Ankle Plantar Flexion 55    Left Ankle Inversion 25    Left Ankle Eversion 18      PROM   Left Ankle Dorsiflexion 10    Left Ankle Inversion 35    Left Ankle Eversion 25                         [facility] Adult PT Treatment/Exercise - 08/12/19 0811      Knee/Hip Exercises: Aerobic   Nustep L6 BLEs only x 6 min - working on keeping Lt heel down      Knee/Hip Exercises: Machines for Strengthening   Cybex Knee Extension LLE only 10# 3x10    Cybex Knee Flexion LLE only 15# 3x10    Cybex Leg Press LLE only 75# 3x10      Ankle Exercises: Stretches   Slant Board Stretch 3 reps;30 seconds   with bent and extended knee     Ankle Exercises: Standing   Side Shuffle (Round  Trip) 10'x4 reps at counter    Braiding (Round Trip) 10'x4 at counter    Other Standing Ankle Exercises tandem gait forwards/backwards 10'x2 each                    PT Short Term Goals - 08/12/19 0841      PT SHORT TERM GOAL #1   Title Patient demonstrates & verbalizes understanding of initial HEP. (All STGs Target Date 08/01/2019)    Time 5    Period Weeks    Status Achieved    Target Date 08/01/19      PT SHORT TERM GOAL #2   Title Left ankle PROM dorsiflexion >10* & plantarflexion >40*    Baseline 6/15: plantarflexion met, DF not met to date    Time 5    Period Weeks    Status Achieved    Target Date 08/01/19      PT SHORT TERM GOAL #3   Title Patient ambulates 300' with crutches PWB modified independent.    Time 5    Period Weeks    Status Achieved    Target Date 08/01/19      PT SHORT TERM GOAL #4   Title Patient negotiates stairs with single rail & crutch, ramps & curbs with crutches PWB with supervision.    Baseline MET 08/05/2019    Time 5    Period Weeks    Status Achieved    Target Date 08/01/19      PT SHORT TERM GOAL #5   Title left ankle  pain </= 3/10 with activities    Time 5    Period Weeks    Status Achieved    Target Date 08/01/19             PT Long Term Goals - 07/01/19 2146      PT LONG TERM GOAL #1   Title Patient verbalizes & demonstrates understanding of ongoing HEP / fitness plan. (All LTGs Target Date: 09/05/2019)    Time 10    Period Weeks    Status New    Target Date 09/05/19      PT LONG TERM GOAL #2   Title Left Ankle AROM Dorsiflexion 15*, Plantarflexion 60*    Time 10    Period Weeks    Status New    Target Date 09/05/19      PT LONG TERM GOAL #3   Title Patient ambulates >500' modified independent LRAD with full WB or WBAT in compliance with MD orders.    Time 10    Period Weeks    Status New    Target Date 09/05/19      PT LONG TERM GOAL #4   Title Patient negotiates stairs single rail, ramps & curbs modified independent LRAD with full WB or WBAT in compliance with MD orders.    Time 10    Period Weeks    Status New    Target Date 09/05/19      PT LONG TERM GOAL #5   Title Berg Balance >45/56 to indicate lower fall risk    Time 10    Period Weeks    Status New    Target Date 09/05/19      Additional Long Term Goals   Additional Long Term Goals Yes      PT LONG TERM GOAL #6   Title Patient reports left ankle pain </= 1/10 and low back pain at prior level or better.    Time 10  Period Weeks    Status New    Target Date 09/05/19                 Plan - 08/12/19 0841    Clinical Impression Statement Pt demonstrating improved ROM today and progressing well with strengthening and balance.  Overall progressing well with PT.  Will continue to benefit from PT to maximize function.    Personal Factors and Comorbidities Comorbidity 3+    Comorbidities anxiety, depression, migraines, back pain    Examination-Activity Limitations Locomotion Level;Squat;Stairs;Stand;Transfers    Examination-Participation Restrictions Driving    Stability/Clinical Decision Making  Evolving/Moderate complexity    Rehab Potential Good    PT Frequency --   2-3x/week   PT Duration Other (comment)   10 weeks   PT Treatment/Interventions ADLs/Self Care Home Management;Cryotherapy;Electrical Stimulation;DME Instruction;Gait training;Stair training;Functional mobility training;Therapeutic activities;Therapeutic exercise;Balance training;Neuromuscular re-education;Patient/family education;Manual techniques;Scar mobilization;Passive range of motion;Dry needling;Vasopneumatic Device    PT Next Visit Plan do BERG, continue with gait training, pre-gait activities, BFR and LLE strengthening;    PT [place] and Agree with Plan of Care Patient           Patient will benefit from skilled therapeutic intervention in order to improve the following deficits and impairments:  Abnormal gait, Decreased balance, Decreased endurance, Decreased mobility, Decreased range of motion, Decreased scar mobility, Decreased strength, Increased edema, Obesity, Pain  Visit Diagnosis: Other abnormalities of gait and mobility  Unsteadiness on feet  Abnormal posture  Muscle weakness (generalized)  Stiffness of left ankle, not elsewhere classified  Localized edema  Pain in left ankle and joints of left foot  History of fall     Problem List Patient Active Problem List   Diagnosis Date Noted   Displaced bimalleolar fracture of left lower leg, initial encounter for closed fracture 05/15/2019   Ankle syndesmosis disruption, left, initial encounter 05/15/2019   Migraine without aura and without status migrainosus, not intractable 07/22/2018   Visit for routine gyn exam 02/08/2017   Anxiety and depression 01/20/2014   Encounter for other general counseling or advice on contraception 01/20/2014      Laureen Abrahams, PT, DPT 08/12/19 8:43 AM     [place] [address] [place], [place], [address] Phone:  [phone]   Fax:  [phone]  Name: Kylie Ware MRN: [id number] Date of Birth: 08/26/1995

## 2019-08-14 ENCOUNTER — Encounter: Payer: Self-pay | Admitting: Physical Therapy

## 2019-08-14 ENCOUNTER — Other Ambulatory Visit: Payer: Self-pay

## 2019-08-14 ENCOUNTER — Ambulatory Visit (INDEPENDENT_AMBULATORY_CARE_PROVIDER_SITE_OTHER): Payer: Federal, State, Local not specified - PPO | Admitting: Physical Therapy

## 2019-08-14 DIAGNOSIS — M6281 Muscle weakness (generalized): Secondary | ICD-10-CM

## 2019-08-14 DIAGNOSIS — R2681 Unsteadiness on feet: Secondary | ICD-10-CM | POA: Diagnosis not present

## 2019-08-14 DIAGNOSIS — M25672 Stiffness of left ankle, not elsewhere classified: Secondary | ICD-10-CM

## 2019-08-14 DIAGNOSIS — R600 Localized edema: Secondary | ICD-10-CM

## 2019-08-14 DIAGNOSIS — R2689 Other abnormalities of gait and mobility: Secondary | ICD-10-CM | POA: Diagnosis not present

## 2019-08-14 DIAGNOSIS — R293 Abnormal posture: Secondary | ICD-10-CM

## 2019-08-14 DIAGNOSIS — M25572 Pain in left ankle and joints of left foot: Secondary | ICD-10-CM

## 2019-08-14 NOTE — Therapy (Signed)
[place], [place], [address] Phone: [phone]   Fax:  [phone]  Physical Therapy Treatment  Patient Details  Name: Kylie Ware MRN: [id number] Date of Birth: 03/05/1995 Referring Provider (PT): Frankey Shown, MD   Encounter Date: 08/14/2019   PT End of Session - 08/14/19 0800    Visit Number 14    Number of Visits 23    Date for PT Re-Evaluation 09/05/19    Authorization Type $25 co-pay, ded $350 met, OOP max $5000, $719.13 remaining, 75 PT visit limit per year    PT Start Time 0800    PT Stop Time 0846    PT Time Calculation (min) 46 min    Activity Tolerance Patient tolerated treatment well    Behavior During Therapy [facility] for tasks assessed/performed           Past Medical History:  Diagnosis Date  . Anxiety   . Back pain    pt states d/t "slipped disc"  . Depression   . Migraines     Past Surgical History:  Procedure Laterality Date  . ORIF ANKLE FRACTURE Left 05/15/2019   Procedure: OPEN REDUCTION INTERNAL FIXATION (ORIF) LEFT BIMALLEOLAR ANKLE AND SYNDESMOSIS;  Surgeon: Leandrew Koyanagi, MD;  Location: [place];  Service: Orthopedics;  Laterality: Left;  . WISDOM TOOTH EXTRACTION      There were no vitals filed for this visit.   Subjective Assessment - 08/14/19 0800    Subjective She walks without device in home & office (short distances) for 1-2 hours until left leg is sore. She is able to do this 2-4 times / day.    Pertinent History anxiety, depression, migraines, back pain    Limitations Walking;Standing;House hold activities    How long can you stand comfortably? 15 minutes    Patient Stated Goals to be walk again, full range & function in ankle back to where it was    Currently in Pain? No/denies    Pain Onset More than a month ago    Pain Onset More than a month ago                             [facility] Adult PT Treatment/Exercise - 08/14/19 0800       Ambulation/Gait   Gait Comments ambulating between exercise areas without device with ASO only      Exercises   Other Exercises  BFR: LOP 240 mmHg, cuff to 180 mmHg, cuff size 4      Knee/Hip Exercises: Aerobic   Nustep L7 BLEs only x 6 min - working on keeping Lt heel down      Knee/Hip Exercises: Machines for Strengthening   Cybex Knee Extension LLE only 10# 3x10 with BFR    Cybex Knee Flexion LLE only 20# 3x10 with BFR    Cybex Leg Press LLE only 75# 3x10 with BFR      Knee/Hip Exercises: Standing   Lateral Step Up Left;10 reps;3 sets;Hand Hold: 1;Step Height: 4"   with BFR, 3 sets w/ diagonal lat to med & 3 sets med to lat   Lateral Step Up Limitations foam 4" beam to work on ankle stabilzation with strengthening    Forward Step Up Left;10 reps;3 sets;Hand Hold: 1;Step Height: 4"   with BFR   Forward Step Up Limitations foam 4" beam to work on ankle stabilzation with strengthening      Ankle Exercises:  Standing   Heel Raises 10 reps;Both;5 seconds   3 sets with BFR   Side Shuffle (Round Trip) --    Braiding (Round Trip) --    Other Standing Ankle Exercises --    Other Standing Ankle Exercises standing tandem on foam beam 60 sec left forward & 60 sec left back with intermittent touch on bars.       Ankle Exercises: Stretches   Slant Board Stretch 3 reps;30 seconds   with bent and extended knee                 PT Education - 08/14/19 0851    Education Details Walk in home without device and carry crutch in community with ASO until slight soreness or fatigue then use crutch for next hour to recover & repeat during day.    Person(s) Educated Patient    Methods Explanation;Verbal cues    Comprehension Verbalized understanding            PT Short Term Goals - 08/12/19 0841      PT SHORT TERM GOAL #1   Title Patient demonstrates & verbalizes understanding of initial HEP. (All STGs Target Date 08/01/2019)    Time 5    Period Weeks    Status Achieved    Target Date  08/01/19      PT SHORT TERM GOAL #2   Title Left ankle PROM dorsiflexion >10* & plantarflexion >40*    Baseline 6/15: plantarflexion met, DF not met to date    Time 5    Period Weeks    Status Achieved    Target Date 08/01/19      PT SHORT TERM GOAL #3   Title Patient ambulates 300' with crutches PWB modified independent.    Time 5    Period Weeks    Status Achieved    Target Date 08/01/19      PT SHORT TERM GOAL #4   Title Patient negotiates stairs with single rail & crutch, ramps & curbs with crutches PWB with supervision.    Baseline MET 08/05/2019    Time 5    Period Weeks    Status Achieved    Target Date 08/01/19      PT SHORT TERM GOAL #5   Title left ankle pain </= 3/10 with activities    Time 5    Period Weeks    Status Achieved    Target Date 08/01/19             PT Long Term Goals - 07/01/19 2146      PT LONG TERM GOAL #1   Title Patient verbalizes & demonstrates understanding of ongoing HEP / fitness plan. (All LTGs Target Date: 09/05/2019)    Time 10    Period Weeks    Status New    Target Date 09/05/19      PT LONG TERM GOAL #2   Title Left Ankle AROM Dorsiflexion 15*, Plantarflexion 60*    Time 10    Period Weeks    Status New    Target Date 09/05/19      PT LONG TERM GOAL #3   Title Patient ambulates >500' modified independent LRAD with full WB or WBAT in compliance with MD orders.    Time 10    Period Weeks    Status New    Target Date 09/05/19      PT LONG TERM GOAL #4   Title Patient negotiates stairs single rail, ramps & curbs modified  independent LRAD with full WB or WBAT in compliance with MD orders.    Time 10    Period Weeks    Status New    Target Date 09/05/19      PT LONG TERM GOAL #5   Title Berg Balance >45/56 to indicate lower fall risk    Time 10    Period Weeks    Status New    Target Date 09/05/19      Additional Long Term Goals   Additional Long Term Goals Yes      PT LONG TERM GOAL #6   Title Patient  reports left ankle pain </= 1/10 and low back pain at prior level or better.    Time 10    Period Weeks    Status New    Target Date 09/05/19                 Plan - 08/14/19 0804    Clinical Impression Statement PT session focused on strengthening with some balance using Blood Flow Restriction (BFR) to facilitate increased strength benefits with low intensity work.  Patient tolerated session with LE fatigue.    Personal Factors and Comorbidities Comorbidity 3+    Comorbidities anxiety, depression, migraines, back pain    Examination-Activity Limitations Locomotion Level;Squat;Stairs;Stand;Transfers    Examination-Participation Restrictions Driving    Stability/Clinical Decision Making Evolving/Moderate complexity    Rehab Potential Good    PT Frequency --   2-3x/week   PT Duration Other (comment)   10 weeks   PT Treatment/Interventions ADLs/Self Care Home Management;Cryotherapy;Electrical Stimulation;DME Instruction;Gait training;Stair training;Functional mobility training;Therapeutic activities;Therapeutic exercise;Balance training;Neuromuscular re-education;Patient/family education;Manual techniques;Scar mobilization;Passive range of motion;Dry needling;Vasopneumatic Device    PT Next Visit Plan do BERG, continue with gait training, pre-gait activities, BFR and LLE strengthening;    PT [place] and Agree with Plan of Care Patient           Patient will benefit from skilled therapeutic intervention in order to improve the following deficits and impairments:  Abnormal gait, Decreased balance, Decreased endurance, Decreased mobility, Decreased range of motion, Decreased scar mobility, Decreased strength, Increased edema, Obesity, Pain  Visit Diagnosis: Other abnormalities of gait and mobility  Unsteadiness on feet  Abnormal posture  Muscle weakness (generalized)  Stiffness of left ankle, not elsewhere classified  Localized edema  Pain in  left ankle and joints of left foot     Problem List Patient Active Problem List   Diagnosis Date Noted  . Displaced bimalleolar fracture of left lower leg, initial encounter for closed fracture 05/15/2019  . Ankle syndesmosis disruption, left, initial encounter 05/15/2019  . Migraine without aura and without status migrainosus, not intractable 07/22/2018  . Visit for routine gyn exam 02/08/2017  . Anxiety and depression 01/20/2014  . Encounter for other general counseling or advice on contraception 01/20/2014    Jamey Reas PT, DPT 08/14/2019, 8:55 AM  [facility] [address] [place], [place], [address] Phone: [phone]   Fax:  [phone]  Name: Kylie Ware MRN: [id number] Date of Birth: 21-Feb-1995

## 2019-08-18 ENCOUNTER — Encounter: Payer: Self-pay | Admitting: Physical Therapy

## 2019-08-18 ENCOUNTER — Other Ambulatory Visit: Payer: Self-pay

## 2019-08-18 ENCOUNTER — Ambulatory Visit: Payer: Federal, State, Local not specified - PPO | Admitting: Physical Therapy

## 2019-08-18 DIAGNOSIS — R2689 Other abnormalities of gait and mobility: Secondary | ICD-10-CM | POA: Diagnosis not present

## 2019-08-18 DIAGNOSIS — R2681 Unsteadiness on feet: Secondary | ICD-10-CM

## 2019-08-18 DIAGNOSIS — M6281 Muscle weakness (generalized): Secondary | ICD-10-CM

## 2019-08-18 DIAGNOSIS — R293 Abnormal posture: Secondary | ICD-10-CM

## 2019-08-18 DIAGNOSIS — M25672 Stiffness of left ankle, not elsewhere classified: Secondary | ICD-10-CM

## 2019-08-18 DIAGNOSIS — R600 Localized edema: Secondary | ICD-10-CM

## 2019-08-18 NOTE — Therapy (Signed)
[place], [place], [address] Phone: [phone]   Fax:  [phone]  Physical Therapy Treatment  Patient Details  Name: Kylie Ware MRN: [id number] Date of Birth: October 17, 1995 Referring Provider (PT): Frankey Shown, MD   Encounter Date: 08/18/2019   PT End of Session - 08/18/19 0848    Visit Number 15    Number of Visits 23    Date for PT Re-Evaluation 09/05/19    Authorization Type $25 co-pay, ded $350 met, OOP max $5000, $719.13 remaining, 75 PT visit limit per year    PT Start Time 0845    PT Stop Time 0932    PT Time Calculation (min) 47 min    Activity Tolerance Patient tolerated treatment well    Behavior During Therapy [facility] for tasks assessed/performed           Past Medical History:  Diagnosis Date  . Anxiety   . Back pain    pt states d/t "slipped disc"  . Depression   . Migraines     Past Surgical History:  Procedure Laterality Date  . ORIF ANKLE FRACTURE Left 05/15/2019   Procedure: OPEN REDUCTION INTERNAL FIXATION (ORIF) LEFT BIMALLEOLAR ANKLE AND SYNDESMOSIS;  Surgeon: Leandrew Koyanagi, MD;  Location: [place];  Service: Orthopedics;  Laterality: Left;  . WISDOM TOOTH EXTRACTION      There were no vitals filed for this visit.   Subjective Assessment - 08/18/19 0845    Subjective She did more stairs over the weekend and her knee / ankle are sore. She felt like BFR helped last session.    Pertinent History anxiety, depression, migraines, back pain    Limitations Walking;Standing;House hold activities    How long can you stand comfortably? 15 minutes    Patient Stated Goals to be walk again, full range & function in ankle back to where it was    Currently in Pain? Yes    Pain Score 5     Pain Location Ankle    Pain Orientation Left    Pain Descriptors / Indicators Aching;Sore;Sharp    Pain Type Chronic pain    Pain Onset More than a month ago    Pain Frequency Intermittent      Aggravating Factors  standing & walking, increased activity.    Pain Relieving Factors rest    Pain Onset More than a month ago              [facility] PT Assessment - 08/18/19 0845      Standardized Balance Assessment   Standardized Balance Assessment Berg Balance Test      Berg Balance Test   Sit to Stand Able to stand without using hands and stabilize independently    Standing Unsupported Able to stand safely 2 minutes    Sitting with Back Unsupported but Feet Supported on Floor or Stool Able to sit safely and securely 2 minutes    Stand to Sit Sits safely with minimal use of hands    Transfers Able to transfer safely, minor use of hands    Standing Unsupported with Eyes Closed Able to stand 10 seconds safely    Standing Unsupported with Feet Together Able to place feet together independently and stand 1 minute safely    From Standing, Reach Forward with Outstretched Arm Can reach confidently >25 cm (10")    From Standing Position, Pick up Object from Floor Able to pick up shoe safely and easily  From Standing Position, Turn to Look Behind Over each Shoulder Looks behind from both sides and weight shifts well    Turn 360 Degrees Able to turn 360 degrees safely in 4 seconds or less    Standing Unsupported, Alternately Place Feet on Step/Stool Able to stand independently and safely and complete 8 steps in 20 seconds    Standing Unsupported, One Foot in [place] to place foot tandem independently and hold 30 seconds    Standing on One Leg Able to lift leg independently and hold 5-10 seconds   on RLE   Total Score 55                         [facility] Adult PT Treatment/Exercise - 08/18/19 0845      Ambulation/Gait   Gait Comments ambulating between exercise areas without device with ASO only      Exercises   Other Exercises  BFR: LOP 240 mmHg, cuff to 180 mmHg, cuff size 4      Knee/Hip Exercises: Aerobic   Nustep L8 BLEs only x 6 min - working on keeping Lt heel  down      Knee/Hip Exercises: Machines for Strengthening   Cybex Knee Extension LLE only 10# 3x10 with BFR    Cybex Knee Flexion LLE only 20# 3x10 with BFR    Cybex Leg Press LLE only 81# 3x10 with BFR      Knee/Hip Exercises: Standing   Lateral Step Up Left;10 reps;3 sets;Hand Hold: 1;Step Height: 4"   with BFR, 3 sets med to lat and 3 sets lat to med   Lateral Step Up Limitations foam 4" beam to work on ankle stabilzation with strengthening    Forward Step Up Left;10 reps;3 sets;Hand Hold: 1;Step Height: 4"   with BFR   Forward Step Up Limitations foam 4" beam to work on ankle stabilzation with strengthening      Ankle Exercises: Seated   Toe Raise 10 reps;3 seconds   3 sets with BFR,    Toe Raise Limitations green theraband      Ankle Exercises: Stretches   Slant Board Stretch 3 reps;30 seconds   with bent and extended knee     Ankle Exercises: Standing   BAPS Standing;Level 2;10 reps    BAPS Limitations BUE support, ant/post, med/lat & circles CW / CCW    Heel Raises 10 reps;Both;5 seconds   3 sets with BFR   Other Standing Ankle Exercises standing tandem on foam beam 60 sec left forward & 60 sec left back with intermittent touch on bars.                     PT Short Term Goals - 08/12/19 0841      PT SHORT TERM GOAL #1   Title Patient demonstrates & verbalizes understanding of initial HEP. (All STGs Target Date 08/01/2019)    Time 5    Period Weeks    Status Achieved    Target Date 08/01/19      PT SHORT TERM GOAL #2   Title Left ankle PROM dorsiflexion >10* & plantarflexion >40*    Baseline 6/15: plantarflexion met, DF not met to date    Time 5    Period Weeks    Status Achieved    Target Date 08/01/19      PT SHORT TERM GOAL #3   Title Patient ambulates 300' with crutches PWB modified independent.    Time  5    Period Weeks    Status Achieved    Target Date 08/01/19      PT SHORT TERM GOAL #4   Title Patient negotiates stairs with single rail &  crutch, ramps & curbs with crutches PWB with supervision.    Baseline MET 08/05/2019    Time 5    Period Weeks    Status Achieved    Target Date 08/01/19      PT SHORT TERM GOAL #5   Title left ankle pain </= 3/10 with activities    Time 5    Period Weeks    Status Achieved    Target Date 08/01/19             PT Long Term Goals - 08/18/19 0936      PT LONG TERM GOAL #1   Title Patient verbalizes & demonstrates understanding of ongoing HEP / fitness plan. (All LTGs Target Date: 09/05/2019)    Time 10    Period Weeks    Status On-going    Target Date 09/05/19      PT LONG TERM GOAL #2   Title Left Ankle AROM Dorsiflexion 15*, Plantarflexion 60*    Time 10    Period Weeks    Status On-going    Target Date 09/05/19      PT LONG TERM GOAL #3   Title Patient ambulates >500' modified independent LRAD with full WB or WBAT in compliance with MD orders.    Time 10    Period Weeks    Status On-going    Target Date 09/05/19      PT LONG TERM GOAL #4   Title Patient negotiates stairs single rail, ramps & curbs modified independent LRAD with full WB or WBAT in compliance with MD orders.    Time 10    Period Weeks    Status On-going    Target Date 09/05/19      PT LONG TERM GOAL #5   Title Berg Balance >45/56 to indicate lower fall risk    Baseline MET 08/18/2019    Time 10    Period Weeks    Status Achieved      PT LONG TERM GOAL #6   Title Patient reports left ankle pain </= 1/10 and low back pain at prior level or better.    Time 10    Period Weeks    Status On-going    Target Date 09/05/19                 Plan - 08/18/19 0849    Clinical Impression Statement Patient met LTG of Berg Balance score with 55/56. She is on target to meet remaining LTGs by end of plan of care on 09/05/2019.  PT session continued to use Blood Flow Restrictions for strength gains in left lower extremity.    Personal Factors and Comorbidities Comorbidity 3+    Comorbidities anxiety,  depression, migraines, back pain    Examination-Activity Limitations Locomotion Level;Squat;Stairs;Stand;Transfers    Examination-Participation Restrictions Driving    Stability/Clinical Decision Making Evolving/Moderate complexity    Rehab Potential Good    PT Frequency --   2-3x/week   PT Duration Other (comment)   10 weeks   PT Treatment/Interventions ADLs/Self Care Home Management;Cryotherapy;Electrical Stimulation;DME Instruction;Gait training;Stair training;Functional mobility training;Therapeutic activities;Therapeutic exercise;Balance training;Neuromuscular re-education;Patient/family education;Manual techniques;Scar mobilization;Passive range of motion;Dry needling;Vasopneumatic Device    PT Next Visit Plan do BERG, continue with gait training, pre-gait activities, BFR and LLE strengthening;  PT Home Exercise Plan [id number]    Consulted and Agree with Plan of Care Patient           Patient will benefit from skilled therapeutic intervention in order to improve the following deficits and impairments:  Abnormal gait, Decreased balance, Decreased endurance, Decreased mobility, Decreased range of motion, Decreased scar mobility, Decreased strength, Increased edema, Obesity, Pain  Visit Diagnosis: Other abnormalities of gait and mobility  Unsteadiness on feet  Abnormal posture  Muscle weakness (generalized)  Stiffness of left ankle, not elsewhere classified  Localized edema     Problem List Patient Active Problem List   Diagnosis Date Noted  . Displaced bimalleolar fracture of left lower leg, initial encounter for closed fracture 05/15/2019  . Ankle syndesmosis disruption, left, initial encounter 05/15/2019  . Migraine without aura and without status migrainosus, not intractable 07/22/2018  . Visit for routine gyn exam 02/08/2017  . Anxiety and depression 01/20/2014  . Encounter for other general counseling or advice on contraception 01/20/2014    Jamey Reas PT,  DPT 08/18/2019, 9:45 AM  [facility] Physical Therapy [address] [place], [place], [address] Phone: [phone]   Fax:  [phone]  Name: Kylie Ware MRN: [id number] Date of Birth: [birth year]

## 2019-08-20 ENCOUNTER — Ambulatory Visit: Payer: Federal, State, Local not specified - PPO | Admitting: Physical Therapy

## 2019-08-20 ENCOUNTER — Encounter: Payer: Self-pay | Admitting: Physical Therapy

## 2019-08-20 ENCOUNTER — Other Ambulatory Visit: Payer: Self-pay

## 2019-08-20 DIAGNOSIS — R2681 Unsteadiness on feet: Secondary | ICD-10-CM | POA: Diagnosis not present

## 2019-08-20 DIAGNOSIS — R293 Abnormal posture: Secondary | ICD-10-CM | POA: Diagnosis not present

## 2019-08-20 DIAGNOSIS — M25672 Stiffness of left ankle, not elsewhere classified: Secondary | ICD-10-CM

## 2019-08-20 DIAGNOSIS — M6281 Muscle weakness (generalized): Secondary | ICD-10-CM | POA: Diagnosis not present

## 2019-08-20 DIAGNOSIS — Z9181 History of falling: Secondary | ICD-10-CM

## 2019-08-20 DIAGNOSIS — M25572 Pain in left ankle and joints of left foot: Secondary | ICD-10-CM

## 2019-08-20 DIAGNOSIS — R2689 Other abnormalities of gait and mobility: Secondary | ICD-10-CM

## 2019-08-20 DIAGNOSIS — R600 Localized edema: Secondary | ICD-10-CM

## 2019-08-20 NOTE — Therapy (Signed)
[place], [place], [address] Phone: [phone]   Fax:  [phone]  Physical Therapy Treatment  Patient Details  Name: Kylie Ware MRN: [id number] Date of Birth: [birth year] Referring Provider (PT): Frankey Shown, MD   Encounter Date: 08/20/2019   PT End of Session - 08/20/19 0932    Visit Number 16    Number of Visits 23    Date for PT Re-Evaluation 09/05/19    Authorization Type $25 co-pay, ded $350 met, OOP max $5000, $719.13 remaining, 75 PT visit limit per year    PT Start Time 0930    PT Stop Time 1015    PT Time Calculation (min) 45 min    Activity Tolerance Patient tolerated treatment well    Behavior During Therapy [facility] for tasks assessed/performed           Past Medical History:  Diagnosis Date  . Anxiety   . Back pain    pt states d/t "slipped disc"  . Depression   . Migraines     Past Surgical History:  Procedure Laterality Date  . ORIF ANKLE FRACTURE Left 05/15/2019   Procedure: OPEN REDUCTION INTERNAL FIXATION (ORIF) LEFT BIMALLEOLAR ANKLE AND SYNDESMOSIS;  Surgeon: Leandrew Koyanagi, MD;  Location: [place];  Service: Orthopedics;  Laterality: Left;  . WISDOM TOOTH EXTRACTION      There were no vitals filed for this visit.   Subjective Assessment - 08/20/19 0933    Subjective She used to enjoy going for walks prior to injury.    Pertinent History anxiety, depression, migraines, back pain    Limitations Walking;Standing;House hold activities    How long can you stand comfortably? 15 minutes    Patient Stated Goals to be walk again, full range & function in ankle back to where it was    Currently in Pain? No/denies    Pain Onset More than a month ago    Pain Onset More than a month ago                             [facility] Adult PT Treatment/Exercise - 08/20/19 0930      Ambulation/Gait   Gait Comments ambulating between exercise areas without device with  ASO only      Exercises   Other Exercises  BFR: LOP 240 mmHg, cuff to 180 mmHg, cuff size 4      Knee/Hip Exercises: Aerobic   Nustep L8 BLEs only x 6 min - working on keeping Lt heel down      Knee/Hip Exercises: Machines for Strengthening   Cybex Knee Extension LLE only 15# 3x10 with BFR    Cybex Knee Flexion LLE only 25# 3x10 with BFR    Cybex Leg Press LLE only 87# 3x10 with BFR      Knee/Hip Exercises: Standing   Lateral Step Up Left;10 reps;3 sets;Hand Hold: 1;Step Height: 4"   with BFR, 3 sets med to lat and 3 sets lat to med   Lateral Step Up Limitations foam 4" beam to work on ankle stabilzation with strengthening    Forward Step Up Left;10 reps;3 sets;Hand Hold: 1;Step Height: 4"   with BFR   Forward Step Up Limitations foam 4" beam to work on ankle stabilzation with strengthening      Ankle Exercises: Standing   BAPS Standing;10 reps;Level 3   PT assist motion control, RLE touching back of board  BAPS Limitations BUE support, ant/post, med/lat & circles CW / CCW    Heel Raises 10 reps;Both;5 seconds   3 sets with BFR   Other Standing Ankle Exercises standing tandem on foam beam 60 sec left forward & 60 sec left back with intermittent touch on bars.       Ankle Exercises: Stretches   Slant Board Stretch 3 reps;30 seconds   with bent and extended knee     Ankle Exercises: Seated   Toe Raise 10 reps;3 seconds   3 sets with BFR,    Toe Raise Limitations green theraband                  PT Education - 08/20/19 0936    Education Details walking program: carry crutch in case needed. Walk 5 minutes out and rest on bench, then 5 minutes back. Once 5 minutes not difficult or problematic, increase to 6 minutes    Person(s) Educated Patient    Methods Explanation;Verbal cues    Comprehension Verbalized understanding            PT Short Term Goals - 08/12/19 0841      PT SHORT TERM GOAL #1   Title Patient demonstrates & verbalizes understanding of initial HEP.  (All STGs Target Date 08/01/2019)    Time 5    Period Weeks    Status Achieved    Target Date 08/01/19      PT SHORT TERM GOAL #2   Title Left ankle PROM dorsiflexion >10* & plantarflexion >40*    Baseline 6/15: plantarflexion met, DF not met to date    Time 5    Period Weeks    Status Achieved    Target Date 08/01/19      PT SHORT TERM GOAL #3   Title Patient ambulates 300' with crutches PWB modified independent.    Time 5    Period Weeks    Status Achieved    Target Date 08/01/19      PT SHORT TERM GOAL #4   Title Patient negotiates stairs with single rail & crutch, ramps & curbs with crutches PWB with supervision.    Baseline MET 08/05/2019    Time 5    Period Weeks    Status Achieved    Target Date 08/01/19      PT SHORT TERM GOAL #5   Title left ankle pain </= 3/10 with activities    Time 5    Period Weeks    Status Achieved    Target Date 08/01/19             PT Long Term Goals - 08/18/19 0936      PT LONG TERM GOAL #1   Title Patient verbalizes & demonstrates understanding of ongoing HEP / fitness plan. (All LTGs Target Date: 09/05/2019)    Time 10    Period Weeks    Status On-going    Target Date 09/05/19      PT LONG TERM GOAL #2   Title Left Ankle AROM Dorsiflexion 15*, Plantarflexion 60*    Time 10    Period Weeks    Status On-going    Target Date 09/05/19      PT LONG TERM GOAL #3   Title Patient ambulates >500' modified independent LRAD with full WB or WBAT in compliance with MD orders.    Time 10    Period Weeks    Status On-going    Target Date 09/05/19  PT LONG TERM GOAL #4   Title Patient negotiates stairs single rail, ramps & curbs modified independent LRAD with full WB or WBAT in compliance with MD orders.    Time 10    Period Weeks    Status On-going    Target Date 09/05/19      PT LONG TERM GOAL #5   Title Berg Balance >45/56 to indicate lower fall risk    Baseline MET 08/18/2019    Time 10    Period Weeks    Status  Achieved      PT LONG TERM GOAL #6   Title Patient reports left ankle pain </= 1/10 and low back pain at prior level or better.    Time 10    Period Weeks    Status On-going    Target Date 09/05/19                 Plan - 08/20/19 0933    Clinical Impression Statement PT was able to increase weight on LE machines. She appears to be getting stronger in left leg hip, knee & ankle muscles with PT session. Blood Flow Restriction appears to be assisting with strengtening.    Personal Factors and Comorbidities Comorbidity 3+    Comorbidities anxiety, depression, migraines, back pain    Examination-Activity Limitations Locomotion Level;Squat;Stairs;Stand;Transfers    Examination-Participation Restrictions Driving    Stability/Clinical Decision Making Evolving/Moderate complexity    Rehab Potential Good    PT Frequency --   2-3x/week   PT Duration Other (comment)   10 weeks   PT Treatment/Interventions ADLs/Self Care Home Management;Cryotherapy;Electrical Stimulation;DME Instruction;Gait training;Stair training;Functional mobility training;Therapeutic activities;Therapeutic exercise;Balance training;Neuromuscular re-education;Patient/family education;Manual techniques;Scar mobilization;Passive range of motion;Dry needling;Vasopneumatic Device    PT Next Visit Plan continue with gait training, pre-gait activities, BFR and LLE strengthening;    PT [place] and Agree with Plan of Care Patient           Patient will benefit from skilled therapeutic intervention in order to improve the following deficits and impairments:  Abnormal gait, Decreased balance, Decreased endurance, Decreased mobility, Decreased range of motion, Decreased scar mobility, Decreased strength, Increased edema, Obesity, Pain  Visit Diagnosis: Unsteadiness on feet  Other abnormalities of gait and mobility  Abnormal posture  Muscle weakness (generalized)  Stiffness of left ankle,  not elsewhere classified  Localized edema  Pain in left ankle and joints of left foot  History of fall     Problem List Patient Active Problem List   Diagnosis Date Noted  . Displaced bimalleolar fracture of left lower leg, initial encounter for closed fracture 05/15/2019  . Ankle syndesmosis disruption, left, initial encounter 05/15/2019  . Migraine without aura and without status migrainosus, not intractable 07/22/2018  . Visit for routine gyn exam 02/08/2017  . Anxiety and depression 01/20/2014  . Encounter for other general counseling or advice on contraception 01/20/2014    Jamey Reas  PT, DPT 08/20/2019, 11:08 AM  [facility] Physical Therapy [address] [place], [place], [address] Phone: [phone]   Fax:  [phone]  Name: AYAUNA MCNAY MRN: [id number] Date of Birth: [birth year]

## 2019-08-26 ENCOUNTER — Encounter: Payer: Self-pay | Admitting: Physical Therapy

## 2019-08-26 ENCOUNTER — Ambulatory Visit: Payer: Federal, State, Local not specified - PPO | Admitting: Physical Therapy

## 2019-08-26 ENCOUNTER — Other Ambulatory Visit: Payer: Self-pay

## 2019-08-26 DIAGNOSIS — M6281 Muscle weakness (generalized): Secondary | ICD-10-CM

## 2019-08-26 DIAGNOSIS — R600 Localized edema: Secondary | ICD-10-CM

## 2019-08-26 DIAGNOSIS — R2681 Unsteadiness on feet: Secondary | ICD-10-CM | POA: Diagnosis not present

## 2019-08-26 DIAGNOSIS — R2689 Other abnormalities of gait and mobility: Secondary | ICD-10-CM | POA: Diagnosis not present

## 2019-08-26 DIAGNOSIS — R293 Abnormal posture: Secondary | ICD-10-CM | POA: Diagnosis not present

## 2019-08-26 DIAGNOSIS — M25572 Pain in left ankle and joints of left foot: Secondary | ICD-10-CM

## 2019-08-26 DIAGNOSIS — M25672 Stiffness of left ankle, not elsewhere classified: Secondary | ICD-10-CM

## 2019-08-26 NOTE — Therapy (Signed)
[facility] Physical Therapy [address] [place], [place], [address] Phone: [phone]   Fax:  [phone]  Physical Therapy Treatment  Patient Details  Name: Kylie Ware MRN: [id number] Date of Birth: [birth year] Referring Provider (PT): Frankey Shown, MD   Encounter Date: 08/26/2019   PT End of Session - 08/26/19 1509    Visit Number 17    Number of Visits 23    Date for PT Re-Evaluation 09/05/19    Authorization Type $25 co-pay, ded $350 met, OOP max $5000, $719.13 remaining, 75 PT visit limit per year    PT Start Time 0930    PT Stop Time 1014    PT Time Calculation (min) 44 min    Activity Tolerance Patient tolerated treatment well    Behavior During Therapy WFL for tasks assessed/performed           Past Medical History:  Diagnosis Date   Anxiety    Back pain    pt states d/t "slipped disc"   Depression    Migraines     Past Surgical History:  Procedure Laterality Date   ORIF ANKLE FRACTURE Left 05/15/2019   Procedure: OPEN REDUCTION INTERNAL FIXATION (ORIF) LEFT BIMALLEOLAR ANKLE AND SYNDESMOSIS;  Surgeon: Leandrew Koyanagi, MD;  Location: [place];  Service: Orthopedics;  Laterality: Left;   WISDOM TOOTH EXTRACTION      There were no vitals filed for this visit.   Subjective Assessment - 08/26/19 0930    Subjective She planned to go for walk in park as PT recommended but rain prohibited it.  She arrived today not carrying or using assistive device for 1st time.    Pertinent History anxiety, depression, migraines, back pain    Limitations Walking;Standing;House hold activities    How long can you stand comfortably? 15 minutes    Patient Stated Goals to be walk again, full range & function in ankle back to where it was    Currently in Pain? No/denies    Pain Onset More than a month ago    Pain Onset More than a month ago                             [facility] Adult PT Treatment/Exercise - 08/26/19  0930      Ambulation/Gait   Gait Comments ambulating between exercise areas without device with ASO only      Exercises   Other Exercises  BFR: LOP 240 mmHg, cuff to 180 mmHg, cuff size 4      Knee/Hip Exercises: Aerobic   Nustep L8 BLEs only x 6 min - working on keeping Lt heel down      Knee/Hip Exercises: Machines for Strengthening   Cybex Knee Extension Batca LLE only 15# 3sets x15resps with BFR    Cybex Knee Flexion Batca LLE only 25# 3sets x10reps with BFR    Cybex Leg Press Shuttle LLE only 93# 2 set x20resps with BFR      Knee/Hip Exercises: Standing   Lateral Step Up Left;10 reps;3 sets;Hand Hold: 1;Step Height: 4"   with BFR, 3 sets med to lat and 3 sets lat to med   Lateral Step Up Limitations foam 4" beam to work on ankle stabilzation with strengthening    Forward Step Up Left;10 reps;3 sets;Hand Hold: 1;Step Height: 4"   with BFR   Forward Step Up Limitations foam 4" beam to work on ankle stabilzation with strengthening  Ankle Exercises: Standing   BAPS Standing;10 reps;Level 3   PT assist motion control, RLE touching back of board    BAPS Limitations BUE support, ant/post, med/lat & circles CW / CCW    Heel Raises 10 reps;Both;5 seconds   3 sets with BFR   Heel Walk (Round Trip) forward & backward 5 rounds in parallel bars with intermittent touch with ASO brace    Toe Walk (Round Trip) forward & backward 5 rounds in parallel bars with intermittent touch with ASO brace    Balance Beam foam beam tandem forward & backward 5 reps with intermittent touch on //bars.      Other Standing Ankle Exercises braiding in parallel bars without UE support 5 rounds right & left      Ankle Exercises: Stretches   Slant Board Stretch 3 reps;30 seconds   with bent and extended knee     Ankle Exercises: Seated   Heel Raises Left;3 seconds   30 reps 1 set ea straight, w/inversion & 2/eversion   Heel Raises Limitations green theraband    Toe Raise 3 seconds   30 reps 1 set ea straight,  w/inversion & 2/eversion   Toe Raise Limitations green theraband                    PT Short Term Goals - 08/12/19 0841      PT SHORT TERM GOAL #1   Title Patient demonstrates & verbalizes understanding of initial HEP. (All STGs Target Date 08/01/2019)    Time 5    Period Weeks    Status Achieved    Target Date 08/01/19      PT SHORT TERM GOAL #2   Title Left ankle PROM dorsiflexion >10* & plantarflexion >40*    Baseline 6/15: plantarflexion met, DF not met to date    Time 5    Period Weeks    Status Achieved    Target Date 08/01/19      PT SHORT TERM GOAL #3   Title Patient ambulates 300' with crutches PWB modified independent.    Time 5    Period Weeks    Status Achieved    Target Date 08/01/19      PT SHORT TERM GOAL #4   Title Patient negotiates stairs with single rail & crutch, ramps & curbs with crutches PWB with supervision.    Baseline MET 08/05/2019    Time 5    Period Weeks    Status Achieved    Target Date 08/01/19      PT SHORT TERM GOAL #5   Title left ankle pain </= 3/10 with activities    Time 5    Period Weeks    Status Achieved    Target Date 08/01/19             PT Long Term Goals - 08/18/19 0936      PT LONG TERM GOAL #1   Title Patient verbalizes & demonstrates understanding of ongoing HEP / fitness plan. (All LTGs Target Date: 09/05/2019)    Time 10    Period Weeks    Status On-going    Target Date 09/05/19      PT LONG TERM GOAL #2   Title Left Ankle AROM Dorsiflexion 15*, Plantarflexion 60*    Time 10    Period Weeks    Status On-going    Target Date 09/05/19      PT LONG TERM GOAL #3   Title Patient ambulates >500'  modified independent LRAD with full WB or WBAT in compliance with MD orders.    Time 10    Period Weeks    Status On-going    Target Date 09/05/19      PT LONG TERM GOAL #4   Title Patient negotiates stairs single rail, ramps & curbs modified independent LRAD with full WB or WBAT in compliance with MD  orders.    Time 10    Period Weeks    Status On-going    Target Date 09/05/19      PT LONG TERM GOAL #5   Title Berg Balance >45/56 to indicate lower fall risk    Baseline MET 08/18/2019    Time 10    Period Weeks    Status Achieved      PT LONG TERM GOAL #6   Title Patient reports left ankle pain </= 1/10 and low back pain at prior level or better.    Time 10    Period Weeks    Status On-going    Target Date 09/05/19                 Plan - 08/26/19 1509    Clinical Impression Statement PT increased resistance of ankle & knee exercises and she tolerated well. PT also worked on standing balance activities that require more ankle stabilition / motion / strength.    Personal Factors and Comorbidities Comorbidity 3+    Comorbidities anxiety, depression, migraines, back pain    Examination-Activity Limitations Locomotion Level;Squat;Stairs;Stand;Transfers    Examination-Participation Restrictions Driving    Stability/Clinical Decision Making Evolving/Moderate complexity    Rehab Potential Good    PT Frequency --   2-3x/week   PT Duration Other (comment)   10 weeks   PT Treatment/Interventions ADLs/Self Care Home Management;Cryotherapy;Electrical Stimulation;DME Instruction;Gait training;Stair training;Functional mobility training;Therapeutic activities;Therapeutic exercise;Balance training;Neuromuscular re-education;Patient/family education;Manual techniques;Scar mobilization;Passive range of motion;Dry needling;Vasopneumatic Device    PT Next Visit Plan continue with gait training, pre-gait activities, BFR and LLE strengthening;    PT [place] and Agree with Plan of Care Patient           Patient will benefit from skilled therapeutic intervention in order to improve the following deficits and impairments:  Abnormal gait, Decreased balance, Decreased endurance, Decreased mobility, Decreased range of motion, Decreased scar mobility, Decreased  strength, Increased edema, Obesity, Pain  Visit Diagnosis: Unsteadiness on feet  Other abnormalities of gait and mobility  Abnormal posture  Muscle weakness (generalized)  Stiffness of left ankle, not elsewhere classified  Localized edema  Pain in left ankle and joints of left foot     Problem List Patient Active Problem List   Diagnosis Date Noted   Displaced bimalleolar fracture of left lower leg, initial encounter for closed fracture 05/15/2019   Ankle syndesmosis disruption, left, initial encounter 05/15/2019   Migraine without aura and without status migrainosus, not intractable 07/22/2018   Visit for routine gyn exam 02/08/2017   Anxiety and depression 01/20/2014   Encounter for other general counseling or advice on contraception 01/20/2014    Jamey Reas PT, DPT 08/26/2019, 3:11 PM  [facility] Physical Therapy [address] [place], [place], [address] Phone: [phone]   Fax:  [phone]  Name: Kylie Ware MRN: [id number] Date of Birth: Oct 08, 1995

## 2019-08-28 ENCOUNTER — Other Ambulatory Visit: Payer: Self-pay

## 2019-08-28 ENCOUNTER — Encounter: Payer: Self-pay | Admitting: Physical Therapy

## 2019-08-28 ENCOUNTER — Ambulatory Visit: Payer: Federal, State, Local not specified - PPO | Admitting: Physical Therapy

## 2019-08-28 DIAGNOSIS — R2689 Other abnormalities of gait and mobility: Secondary | ICD-10-CM

## 2019-08-28 DIAGNOSIS — M25672 Stiffness of left ankle, not elsewhere classified: Secondary | ICD-10-CM

## 2019-08-28 DIAGNOSIS — R293 Abnormal posture: Secondary | ICD-10-CM

## 2019-08-28 DIAGNOSIS — M6281 Muscle weakness (generalized): Secondary | ICD-10-CM

## 2019-08-28 DIAGNOSIS — R2681 Unsteadiness on feet: Secondary | ICD-10-CM | POA: Diagnosis not present

## 2019-08-28 DIAGNOSIS — M25572 Pain in left ankle and joints of left foot: Secondary | ICD-10-CM

## 2019-08-28 DIAGNOSIS — R600 Localized edema: Secondary | ICD-10-CM

## 2019-08-28 NOTE — Therapy (Signed)
[facility] Physical Therapy [address] [place], [place], [address] Phone: [phone]   Fax:  [phone]  Physical Therapy Treatment  Patient Details  Name: Kylie Ware MRN: [id number] Date of Birth: [birth year] Referring Provider (PT): Frankey Shown, MD   Encounter Date: 08/28/2019   PT End of Session - 08/28/19 0924    Visit Number 18    Number of Visits 23    Date for PT Re-Evaluation 09/05/19    Authorization Type $25 co-pay, ded $350 met, OOP max $5000, $719.13 remaining, 75 PT visit limit per year    PT Start Time 0840    PT Stop Time 0920    PT Time Calculation (min) 40 min    Activity Tolerance Patient tolerated treatment well    Behavior During Therapy [facility] for tasks assessed/performed           Past Medical History:  Diagnosis Date   Anxiety    Back pain    pt states d/t "slipped disc"   Depression    Migraines     Past Surgical History:  Procedure Laterality Date   ORIF ANKLE FRACTURE Left 05/15/2019   Procedure: OPEN REDUCTION INTERNAL FIXATION (ORIF) LEFT BIMALLEOLAR ANKLE AND SYNDESMOSIS;  Surgeon: Leandrew Koyanagi, MD;  Location: [place];  Service: Orthopedics;  Laterality: Left;   WISDOM TOOTH EXTRACTION      There were no vitals filed for this visit.    Subjective Assessment - 08/28/19 0845    Subjective doing well, ankle is feeling pretty good.  is walking a little without the brace.    Pertinent History anxiety, depression, migraines, back pain    Limitations Walking;Standing;House hold activities    How long can you stand comfortably? 15 minutes    Patient Stated Goals to be walk again, full range & function in ankle back to where it was    Currently in Pain? No/denies    Pain Onset More than a month ago    Pain Onset More than a month ago                          Objective measurements completed on examination: See above findings.       [place] Adult PT Treatment/Exercise -  08/28/19 0846      Knee/Hip Exercises: Aerobic   Nustep L7 BLEs only x 8 min - working on keeping Lt heel down      Knee/Hip Exercises: Standing   Functional Squat 3 sets;10 reps   10#   Functional Squat Limitations on ramp, facing downhill      Ankle Exercises: Stretches   Slant Board Stretch 3 reps;30 seconds   with bent and extended knee     Ankle Exercises: Standing   SLS on foam 5x20 sec LLE only, intermittent UE support; on level ground with ball circles in hands x 10 CW/CCW (intermittent RLE touch down)    Heel Raises Both   3x10, on incline board   Other Standing Ankle Exercises tandem stand 5x20 sec bil; ant/post weight shift for ankle strategy                    PT Short Term Goals - 08/12/19 0841      PT SHORT TERM GOAL #1   Title Patient demonstrates & verbalizes understanding of initial HEP. (All STGs Target Date 08/01/2019)    Time 5    Period Weeks    Status Achieved  Target Date 08/01/19      PT SHORT TERM GOAL #2   Title Left ankle PROM dorsiflexion >10* & plantarflexion >40*    Baseline 6/15: plantarflexion met, DF not met to date    Time 5    Period Weeks    Status Achieved    Target Date 08/01/19      PT SHORT TERM GOAL #3   Title Patient ambulates 300' with crutches PWB modified independent.    Time 5    Period Weeks    Status Achieved    Target Date 08/01/19      PT SHORT TERM GOAL #4   Title Patient negotiates stairs with single rail & crutch, ramps & curbs with crutches PWB with supervision.    Baseline MET 08/05/2019    Time 5    Period Weeks    Status Achieved    Target Date 08/01/19      PT SHORT TERM GOAL #5   Title left ankle pain </= 3/10 with activities    Time 5    Period Weeks    Status Achieved    Target Date 08/01/19             PT Long Term Goals - 08/18/19 0936      PT LONG TERM GOAL #1   Title Patient verbalizes & demonstrates understanding of ongoing HEP / fitness plan. (All LTGs Target Date: 09/05/2019)     Time 10    Period Weeks    Status On-going    Target Date 09/05/19      PT LONG TERM GOAL #2   Title Left Ankle AROM Dorsiflexion 15*, Plantarflexion 60*    Time 10    Period Weeks    Status On-going    Target Date 09/05/19      PT LONG TERM GOAL #3   Title Patient ambulates >500' modified independent LRAD with full WB or WBAT in compliance with MD orders.    Time 10    Period Weeks    Status On-going    Target Date 09/05/19      PT LONG TERM GOAL #4   Title Patient negotiates stairs single rail, ramps & curbs modified independent LRAD with full WB or WBAT in compliance with MD orders.    Time 10    Period Weeks    Status On-going    Target Date 09/05/19      PT LONG TERM GOAL #5   Title Berg Balance >45/56 to indicate lower fall risk    Baseline MET 08/18/2019    Time 10    Period Weeks    Status Achieved      PT LONG TERM GOAL #6   Title Patient reports left ankle pain </= 1/10 and low back pain at prior level or better.    Time 10    Period Weeks    Status On-going    Target Date 09/05/19                  Plan - 08/28/19 0924    Clinical Impression Statement Pt tolerated session today which focused on balance and strengthening of Lt ankle out of ASO today.  Mild pain with exercises which is expected out of ASO.  Overall progressing well with PT.    Personal Factors and Comorbidities Comorbidity 3+    Comorbidities anxiety, depression, migraines, back pain    Examination-Activity Limitations Locomotion Level;Squat;Stairs;Stand;Transfers    Examination-Participation Restrictions Driving    Stability/Clinical  Decision Making Evolving/Moderate complexity    Rehab Potential Good    PT Frequency --   2-3x/week   PT Duration Other (comment)   10 weeks   PT Treatment/Interventions ADLs/Self Care Home Management;Cryotherapy;Electrical Stimulation;DME Instruction;Gait training;Stair training;Functional mobility training;Therapeutic activities;Therapeutic  exercise;Balance training;Neuromuscular re-education;Patient/family education;Manual techniques;Scar mobilization;Passive range of motion;Dry needling;Vasopneumatic Device    PT Next Visit Plan continue with gait training, pre-gait activities, BFR and LLE strengthening;  will need MD note and recert next week - plan to recert 1x/wk x 4 weeks    PT [place] and Agree with Plan of Care Patient           Patient will benefit from skilled therapeutic intervention in order to improve the following deficits and impairments:  Abnormal gait, Decreased balance, Decreased endurance, Decreased mobility, Decreased range of motion, Decreased scar mobility, Decreased strength, Increased edema, Obesity, Pain  Visit Diagnosis: Unsteadiness on feet  Other abnormalities of gait and mobility  Abnormal posture  Muscle weakness (generalized)  Stiffness of left ankle, not elsewhere classified  Localized edema  Pain in left ankle and joints of left foot     Problem List Patient Active Problem List   Diagnosis Date Noted   Displaced bimalleolar fracture of left lower leg, initial encounter for closed fracture 05/15/2019   Ankle syndesmosis disruption, left, initial encounter 05/15/2019   Migraine without aura and without status migrainosus, not intractable 07/22/2018   Visit for routine gyn exam 02/08/2017   Anxiety and depression 01/20/2014   Encounter for other general counseling or advice on contraception 01/20/2014      Laureen Abrahams, PT, DPT 08/28/19 9:26 AM    [facility] [address] [place], [place], [address] Phone: [phone]   Fax:  [phone]  Name: JANNELLY BERGREN MRN: [id number] Date of Birth: 10-Mar-1995

## 2019-09-01 ENCOUNTER — Encounter: Payer: Self-pay | Admitting: Physical Therapy

## 2019-09-01 ENCOUNTER — Ambulatory Visit: Payer: Federal, State, Local not specified - PPO | Admitting: Physical Therapy

## 2019-09-01 ENCOUNTER — Other Ambulatory Visit: Payer: Self-pay

## 2019-09-01 DIAGNOSIS — R600 Localized edema: Secondary | ICD-10-CM

## 2019-09-01 DIAGNOSIS — R293 Abnormal posture: Secondary | ICD-10-CM

## 2019-09-01 DIAGNOSIS — M6281 Muscle weakness (generalized): Secondary | ICD-10-CM | POA: Diagnosis not present

## 2019-09-01 DIAGNOSIS — R2681 Unsteadiness on feet: Secondary | ICD-10-CM

## 2019-09-01 DIAGNOSIS — M25672 Stiffness of left ankle, not elsewhere classified: Secondary | ICD-10-CM

## 2019-09-01 DIAGNOSIS — R2689 Other abnormalities of gait and mobility: Secondary | ICD-10-CM

## 2019-09-01 DIAGNOSIS — M25572 Pain in left ankle and joints of left foot: Secondary | ICD-10-CM

## 2019-09-01 NOTE — Therapy (Signed)
[facility] [place], [place], [address] Phone: [phone]   Fax:  [phone]  Physical Therapy Treatment  Patient Details  Name: Kylie Ware MRN: [id number] Date of Birth: October 31, 1995 Referring Provider (PT): Frankey Shown, MD   Encounter Date: 09/01/2019   PT End of Session - 09/01/19 0845    Visit Number 19    Number of Visits 23    Date for PT Re-Evaluation 09/05/19    Authorization Type $25 co-pay, ded $350 met, OOP max $5000, $719.13 remaining, 75 PT visit limit per year    PT Start Time 0845    PT Stop Time 0930    PT Time Calculation (min) 45 min    Activity Tolerance Patient tolerated treatment well    Behavior During Therapy [facility] for tasks assessed/performed           Past Medical History:  Diagnosis Date  . Anxiety   . Back pain    pt states d/t "slipped disc"  . Depression   . Migraines     Past Surgical History:  Procedure Laterality Date  . ORIF ANKLE FRACTURE Left 05/15/2019   Procedure: OPEN REDUCTION INTERNAL FIXATION (ORIF) LEFT BIMALLEOLAR ANKLE AND SYNDESMOSIS;  Surgeon: Leandrew Koyanagi, MD;  Location: [facility];  Service: Orthopedics;  Laterality: Left;  . WISDOM TOOTH EXTRACTION      There were no vitals filed for this visit.   Subjective Assessment - 09/01/19 0845    Subjective She had some quick sharp pains up to 4/10 on day of last PT session which was first without ASO. No pain or discomfort for remainder of weekend. Feels slight ache this morning related to how she put ASO on her leg. She went for walk in park 15 minutes out with rest then 15 minutes back without issues.    Pertinent History anxiety, depression, migraines, back pain    Limitations Walking;Standing;House hold activities    How long can you stand comfortably? 15 minutes    Patient Stated Goals to be walk again, full range & function in ankle back to where it was    Currently in Pain? Yes    Pain Score 1      Pain Location Ankle    Pain Orientation Left    Pain Descriptors / Indicators Aching    Pain Onset More than a month ago    Pain Frequency Intermittent    Aggravating Factors  increased activity level    Pain Relieving Factors rest & ice    Pain Onset More than a month ago              [facility] PT Assessment - 09/01/19 0845      AROM   Left Ankle Dorsiflexion 17   17* knee straight & 19* knee flexed   Left Ankle Plantar Flexion 60    Left Ankle Inversion 38    Left Ankle Eversion 26      Berg Balance Test   Berg comment: on 08/18/2019 was 55/56                         [facility] Adult PT Treatment/Exercise - 09/01/19 0845      Ambulation/Gait   Gait Comments ambulating between exercise areas without device without ASO brace      High Level Balance   High Level Balance Activities Braiding      Exercises   Other Exercises  BFR: LOP 240  mmHg, cuff to 180 mmHg, cuff size 4      Knee/Hip Exercises: Machines for Strengthening   Cybex Knee Extension BFR: Batca LLE only 10# 30 reps then 15 reps 1 set & 5# 15 reps 2 sets with 30 sec rest between reps    Cybex Knee Flexion BFR: Batca LLE only 15# 30 reps then 15 reps 3 sets with 30 sec rest between reps    Cybex Leg Press BFR: Shuttle LLE only 75# 30 reps then 15 reps 3 sets with 30 sec rest between reps      Knee/Hip Exercises: Standing   Lateral Step Up --    Lateral Step Up Limitations --    Forward Step Up --    Forward Step Up Limitations --      Ankle Exercises: Standing   BAPS Standing;Level 2;15 reps   no ASO brace & no assist from PT   BAPS Limitations BUE support, ant/post, med/lat & circles CW / CCW    Heel Raises --    Heel Walk (Round Trip) --    Toe Walk (Round Trip) --    Balance Beam --    Other Standing Ankle Exercises --      Ankle Exercises: Stretches   Slant Board Stretch 3 reps;30 seconds   with bent and extended knee     Ankle Exercises: Seated   Heel Raises Left;3 seconds;15 reps   15  reps 1 set ea straight, w/inversion & 2/eversion   Heel Raises Limitations green theraband doubled    Toe Raise 3 seconds;15 reps   15 reps 1 set ea straight, w/inversion & 2/eversion   Toe Raise Limitations green theraband                    PT Short Term Goals - 08/12/19 0841      PT SHORT TERM GOAL #1   Title Patient demonstrates & verbalizes understanding of initial HEP. (All STGs Target Date 08/01/2019)    Time 5    Period Weeks    Status Achieved    Target Date 08/01/19      PT SHORT TERM GOAL #2   Title Left ankle PROM dorsiflexion >10* & plantarflexion >40*    Baseline 6/15: plantarflexion met, DF not met to date    Time 5    Period Weeks    Status Achieved    Target Date 08/01/19      PT SHORT TERM GOAL #3   Title Patient ambulates 300' with crutches PWB modified independent.    Time 5    Period Weeks    Status Achieved    Target Date 08/01/19      PT SHORT TERM GOAL #4   Title Patient negotiates stairs with single rail & crutch, ramps & curbs with crutches PWB with supervision.    Baseline MET 08/05/2019    Time 5    Period Weeks    Status Achieved    Target Date 08/01/19      PT SHORT TERM GOAL #5   Title left ankle pain </= 3/10 with activities    Time 5    Period Weeks    Status Achieved    Target Date 08/01/19             PT Long Term Goals - 09/01/19 1127      PT LONG TERM GOAL #1   Title Patient verbalizes & demonstrates understanding of ongoing HEP / fitness plan. (All LTGs Target  Date: 09/05/2019)    Time 10    Period Weeks    Status On-going    Target Date 09/05/19      PT LONG TERM GOAL #2   Title Left Ankle AROM Dorsiflexion 15*, Plantarflexion 60*    Baseline MET 09/01/2019    Time 10    Period Weeks    Status Achieved      PT LONG TERM GOAL #3   Title Patient ambulates >500' modified independent LRAD with full WB or WBAT in compliance with MD orders.    Time 10    Period Weeks    Status On-going    Target Date  09/05/19      PT LONG TERM GOAL #4   Title Patient negotiates stairs single rail, ramps & curbs modified independent LRAD with full WB or WBAT in compliance with MD orders.    Time 10    Period Weeks    Status On-going    Target Date 09/05/19      PT LONG TERM GOAL #5   Title Berg Balance >45/56 to indicate lower fall risk    Baseline MET 08/18/2019    Time 10    Period Weeks    Status Achieved      PT LONG TERM GOAL #6   Title Patient reports left ankle pain </= 1/10 and low back pain at prior level or better.    Baseline MET 09/01/2019    Time 10    Period Weeks    Status Achieved                 Plan - 09/01/19 0845    Clinical Impression Statement Patient met 3 of 6 LTGs and anticipate meeting remaining 3 at next appointment. PT session today was second one without ASO brace and tolerated session without pain.  She reported some quick pain episodes for day of last PT session without ASO but none over following 2 days.  PT also used blood flow restriction therapy for large muscle groups due to deconditioning with nonweigt bearing.  Patient would benefit from additional PT 1x/wk for 4 weeks to return her function closer to prior level.    Personal Factors and Comorbidities Comorbidity 3+    Comorbidities anxiety, depression, migraines, back pain    Examination-Activity Limitations Locomotion Level;Squat;Stairs;Stand;Transfers    Examination-Participation Restrictions Driving    Stability/Clinical Decision Making Evolving/Moderate complexity    Rehab Potential Good    PT Frequency --   2-3x/week   PT Duration Other (comment)   10 weeks   PT Treatment/Interventions ADLs/Self Care Home Management;Cryotherapy;Electrical Stimulation;DME Instruction;Gait training;Stair training;Functional mobility training;Therapeutic activities;Therapeutic exercise;Balance training;Neuromuscular re-education;Patient/family education;Manual techniques;Scar mobilization;Passive range of motion;Dry  needling;Vasopneumatic Device    PT Next Visit Plan check remaining STGs, continue with gait training, pre-gait activities, BFR and LLE strengthening;  - plan to recert 1x/wk x 4 weeks    PT [facility] and Agree with Plan of Care Patient           Patient will benefit from skilled therapeutic intervention in order to improve the following deficits and impairments:  Abnormal gait, Decreased balance, Decreased endurance, Decreased mobility, Decreased range of motion, Decreased scar mobility, Decreased strength, Increased edema, Obesity, Pain  Visit Diagnosis: Unsteadiness on feet  Other abnormalities of gait and mobility  Abnormal posture  Muscle weakness (generalized)  Stiffness of left ankle, not elsewhere classified  Localized edema  Pain in left ankle and joints of  left foot     Problem List Patient Active Problem List   Diagnosis Date Noted  . Displaced bimalleolar fracture of left lower leg, initial encounter for closed fracture 05/15/2019  . Ankle syndesmosis disruption, left, initial encounter 05/15/2019  . Migraine without aura and without status migrainosus, not intractable 07/22/2018  . Visit for routine gyn exam 02/08/2017  . Anxiety and depression 01/20/2014  . Encounter for other general counseling or advice on contraception 01/20/2014    Jamey Reas, PT, DPT 09/01/2019, 11:36 AM  [facility] Therapy [address] [place], [place], [address] Phone: [phone]   Fax:  [phone]  Name: Kylie Ware MRN: [id number] Date of Birth: 1995/05/08

## 2019-09-04 ENCOUNTER — Ambulatory Visit: Payer: Federal, State, Local not specified - PPO | Admitting: Physical Therapy

## 2019-09-04 ENCOUNTER — Ambulatory Visit: Payer: Self-pay

## 2019-09-04 ENCOUNTER — Encounter: Payer: Self-pay | Admitting: Orthopaedic Surgery

## 2019-09-04 ENCOUNTER — Other Ambulatory Visit: Payer: Self-pay

## 2019-09-04 ENCOUNTER — Encounter: Payer: Self-pay | Admitting: Physical Therapy

## 2019-09-04 ENCOUNTER — Ambulatory Visit: Payer: Federal, State, Local not specified - PPO | Admitting: Orthopaedic Surgery

## 2019-09-04 DIAGNOSIS — R2681 Unsteadiness on feet: Secondary | ICD-10-CM | POA: Diagnosis not present

## 2019-09-04 DIAGNOSIS — S82842G Displaced bimalleolar fracture of left lower leg, subsequent encounter for closed fracture with delayed healing: Secondary | ICD-10-CM | POA: Diagnosis not present

## 2019-09-04 DIAGNOSIS — R293 Abnormal posture: Secondary | ICD-10-CM

## 2019-09-04 DIAGNOSIS — M25672 Stiffness of left ankle, not elsewhere classified: Secondary | ICD-10-CM

## 2019-09-04 DIAGNOSIS — R2689 Other abnormalities of gait and mobility: Secondary | ICD-10-CM | POA: Diagnosis not present

## 2019-09-04 DIAGNOSIS — S82842A Displaced bimalleolar fracture of left lower leg, initial encounter for closed fracture: Secondary | ICD-10-CM | POA: Diagnosis not present

## 2019-09-04 DIAGNOSIS — M25572 Pain in left ankle and joints of left foot: Secondary | ICD-10-CM

## 2019-09-04 DIAGNOSIS — Z9181 History of falling: Secondary | ICD-10-CM

## 2019-09-04 DIAGNOSIS — R600 Localized edema: Secondary | ICD-10-CM

## 2019-09-04 DIAGNOSIS — M6281 Muscle weakness (generalized): Secondary | ICD-10-CM | POA: Diagnosis not present

## 2019-09-04 HISTORY — DX: Displaced bimalleolar fracture of left lower leg, subsequent encounter for closed fracture with delayed healing: S82.842G

## 2019-09-04 NOTE — Progress Notes (Signed)
° °  Post-Op Visit Note   Patient: Kylie Ware           Date of Birth: 01-Aug-1995           MRN: [id number] Visit Date: 09/04/2019 PCP: System, Pcp Not In   Assessment & Plan:  Chief Complaint:  Chief Complaint  Patient presents with   Left Ankle - Pain   Visit Diagnoses:  1. Displaced bimalleolar fracture of left lower leg, initial encounter for closed fracture   2. Displaced bimalleolar fracture of left lower leg, subsequent encounter for closed fracture with delayed healing     Plan: Kendre is 3 months and 21 days status post ORIF left bimalleolar ankle fracture and syndesmosis.  The only complaint she has is numbness on the top of her foot.  She does not have any pain.  She is walking in regular shoes at this point she has completed physical therapy.  She has mild swelling of the ankle that is worse with activity and standing.  Surgical scar is fully healed without any tenderness.  Good range of motion of her ankle without pain.  Her x-rays have demonstrated healing of the fracture with intact syndesmosis.  At this point she can increase activity as tolerated.  She understands that swelling can linger for some time after surgery.  Follow-up as needed.  Questions encouraged and answered.  Follow-Up Instructions: Return if symptoms worsen or fail to improve.   Orders:  Orders Placed This Encounter  Procedures   XR Ankle Complete Left   No orders of the defined types were placed in this encounter.   Imaging: XR Ankle Complete Left  Result Date: 09/04/2019 Healed fibula fracture.  Intact syndesmosis.  No complications.   PMFS History: Patient Active Problem List   Diagnosis Date Noted   Displaced bimalleolar fracture of left lower leg, subsequent encounter for closed fracture with delayed healing 09/04/2019   Displaced bimalleolar fracture of left lower leg, initial encounter for closed fracture 05/15/2019   Ankle syndesmosis disruption, left, initial  encounter 05/15/2019   Migraine without aura and without status migrainosus, not intractable 07/22/2018   Visit for routine gyn exam 02/08/2017   Anxiety and depression 01/20/2014   Encounter for other general counseling or advice on contraception 01/20/2014   Past Medical History:  Diagnosis Date   Anxiety    Back pain    pt states d/t "slipped disc"   Depression    Migraines     Family History  Problem Relation Age of Onset   Diabetes Father    Cancer Mother        breast   Cancer Maternal Grandmother 26       breast CA    Past Surgical History:  Procedure Laterality Date   ORIF ANKLE FRACTURE Left 05/15/2019   Procedure: OPEN REDUCTION INTERNAL FIXATION (ORIF) LEFT BIMALLEOLAR ANKLE AND SYNDESMOSIS;  Surgeon: Tarry Kos, MD;  Location: [facility];  Service: Orthopedics;  Laterality: Left;   WISDOM TOOTH EXTRACTION     Social History   Occupational History   Not on file  Tobacco Use   Smoking status: Never Smoker   Smokeless tobacco: Never Used  Vaping Use   Vaping Use: Never used  Substance and Sexual Activity   Alcohol use: No    Alcohol/week: 0.0 standard drinks   Drug use: No   Sexual activity: Not on file

## 2019-09-04 NOTE — Therapy (Signed)
[facility] [address] [place], [place], [address] Phone: [phone]   Fax:  [phone]  Physical Therapy Treatment/Recertification  Patient Details  Name: Kylie Ware MRN: [id number] Date of Birth: 1995-12-11 Referring Provider (PT): Frankey Shown, MD   Encounter Date: 09/04/2019   PT End of Session - 09/04/19 1222    Visit Number 20    Number of Visits 24    Date for PT Re-Evaluation 10/02/19    Authorization Type $25 co-pay, ded $350 met, OOP max $5000, $719.13 remaining, 75 PT visit limit per year    PT Start Time 1141    PT Stop Time 1220    PT Time Calculation (min) 39 min    Activity Tolerance Patient tolerated treatment well    Behavior During Therapy WFL for tasks assessed/performed           Past Medical History:  Diagnosis Date   Anxiety    Back pain    pt states d/t "slipped disc"   Depression    Migraines     Past Surgical History:  Procedure Laterality Date   ORIF ANKLE FRACTURE Left 05/15/2019   Procedure: OPEN REDUCTION INTERNAL FIXATION (ORIF) LEFT BIMALLEOLAR ANKLE AND SYNDESMOSIS;  Surgeon: Leandrew Koyanagi, MD;  Location: [facility];  Service: Orthopedics;  Laterality: Left;   WISDOM TOOTH EXTRACTION      There were no vitals filed for this visit.   Subjective Assessment - 09/04/19 1139    Subjective arrives today without ASO, overall ankle is doing well. ankle still feels a little bit weaker, and balance is still limited    Pertinent History anxiety, depression, migraines, back pain    Limitations Walking;Standing;House hold activities    How long can you stand comfortably? 15 minutes    Patient Stated Goals to be walk again, full range & function in ankle back to where it was    Currently in Pain? No/denies    Pain Onset More than a month ago    Pain Onset More than a month ago              [facility] PT Assessment - 09/04/19 1151      Functional Tests   Functional tests  Single leg stance      Single Leg Stance   Comments LLE: 4 sec on solid surface; 1.5 sec on compliant surface      AROM   Left Ankle Dorsiflexion 5   unweighted   Left Ankle Plantar Flexion 60    Left Ankle Inversion 38    Left Ankle Eversion 26      Strength   Strength Assessment Site Ankle    Right/Left Ankle Left    Right Ankle Dorsiflexion --    Right Ankle Plantar Flexion --    Right Ankle Inversion --    Right Ankle Eversion --    Left Ankle Dorsiflexion 3+/5    Left Ankle Plantar Flexion 3/5    Left Ankle Inversion 3+/5    Left Ankle Eversion 3+/5                         [facility] Adult PT Treatment/Exercise - 09/04/19 1144      Knee/Hip Exercises: Aerobic   Recumbent Bike L3 x 8 min      Knee/Hip Exercises: Plyometrics   Bilateral Jumping 3 sets;10 reps    Bilateral Jumping Limitations supine on shuttle; 75#    Unilateral Jumping 3  sets;10 reps    Unilateral Jumping Limitations 37# on shuttle; LLE only    Other Plyometric Exercises attempted 2" box jumps - pt unable to jump up at this time      Knee/Hip Exercises: Standing   Other Standing Knee Exercises agility ladder x 8 min                    PT Short Term Goals - 08/12/19 0841      PT SHORT TERM GOAL #1   Title Patient demonstrates & verbalizes understanding of initial HEP. (All STGs Target Date 08/01/2019)    Time 5    Period Weeks    Status Achieved    Target Date 08/01/19      PT SHORT TERM GOAL #2   Title Left ankle PROM dorsiflexion >10* & plantarflexion >40*    Baseline 6/15: plantarflexion met, DF not met to date    Time 5    Period Weeks    Status Achieved    Target Date 08/01/19      PT SHORT TERM GOAL #3   Title Patient ambulates 300' with crutches PWB modified independent.    Time 5    Period Weeks    Status Achieved    Target Date 08/01/19      PT SHORT TERM GOAL #4   Title Patient negotiates stairs with single rail & crutch, ramps & curbs with crutches PWB  with supervision.    Baseline MET 08/05/2019    Time 5    Period Weeks    Status Achieved    Target Date 08/01/19      PT SHORT TERM GOAL #5   Title left ankle pain </= 3/10 with activities    Time 5    Period Weeks    Status Achieved    Target Date 08/01/19             PT Long Term Goals - 09/04/19 1223      PT LONG TERM GOAL #1   Title Patient verbalizes & demonstrates understanding of ongoing HEP / fitness plan. (All LTGs Target Date: 10/02/2019)    Time 4    Period Weeks    Status On-going    Target Date 10/02/19      PT LONG TERM GOAL #2   Title Left Ankle AROM Dorsiflexion 15*, Plantarflexion 60*    Baseline MET 09/01/2019    Time 10    Period Weeks    Status Achieved      PT LONG TERM GOAL #3   Title Patient ambulates >500' modified independent LRAD with full WB or WBAT in compliance with MD orders.    Time 10    Period Weeks    Status Achieved      PT LONG TERM GOAL #4   Title Patient negotiates stairs single rail, ramps & curbs modified independent LRAD with full WB or WBAT in compliance with MD orders.    Time 10    Period Weeks    Status Achieved      PT LONG TERM GOAL #5   Title Berg Balance >45/56 to indicate lower fall risk    Baseline MET 08/18/2019    Time 10    Period Weeks    Status Achieved      Additional Long Term Goals   Additional Long Term Goals Yes      PT LONG TERM GOAL #6   Title Patient reports left ankle pain </= 1/10 and  low back pain at prior level or better.    Baseline MET 09/01/2019    Time 10    Period Weeks    Status Achieved      PT LONG TERM GOAL #7   Title demonstrate 5/5 Lt ankle strength for improved function    Status New    Target Date 10/02/19      PT LONG TERM GOAL #8   Title perform LLE SLS on compliant surface > 10 sec for improved stability and balance    Status New    Target Date 10/02/19      PT LONG TERM GOAL  #9   TITLE demonstrate improved agility by performing at least 4" bil jumping without  pain or deviations    Status New    Target Date 10/02/19                 Plan - 09/04/19 1226    Clinical Impression Statement Pt has met all LTGs, and continues to demonstrate decreased strength and agility at this time.  Will continue to benefit from PT 1x/wk x 4 more weeks to progress these deficits.    Personal Factors and Comorbidities Comorbidity 3+    Comorbidities anxiety, depression, migraines, back pain    Examination-Activity Limitations Locomotion Level;Squat;Stairs;Stand;Transfers    Examination-Participation Restrictions Driving    Stability/Clinical Decision Making Evolving/Moderate complexity    Rehab Potential Good    PT Frequency 1x / week   2-3x/week   PT Duration 4 weeks   10 weeks   PT Treatment/Interventions ADLs/Self Care Home Management;Cryotherapy;Electrical Stimulation;DME Instruction;Gait training;Stair training;Functional mobility training;Therapeutic activities;Therapeutic exercise;Balance training;Neuromuscular re-education;Patient/family education;Manual techniques;Scar mobilization;Passive range of motion;Dry needling;Vasopneumatic Device;Taping    PT Next Visit Plan continue strengthening/balance/agility    PT Home Exercise Plan [email or web address]    Consulted and Agree with Plan of Care Patient           Patient will benefit from skilled therapeutic intervention in order to improve the following deficits and impairments:  Abnormal gait, Decreased balance, Decreased endurance, Decreased mobility, Decreased range of motion, Decreased scar mobility, Decreased strength, Increased edema, Obesity, Pain  Visit Diagnosis: Unsteadiness on feet - Plan: PT plan of care cert/re-cert  Other abnormalities of gait and mobility - Plan: PT plan of care cert/re-cert  Abnormal posture - Plan: PT plan of care cert/re-cert  Muscle weakness (generalized) - Plan: PT plan of care cert/re-cert  Stiffness of left ankle, not elsewhere classified - Plan: PT plan of care  cert/re-cert  Localized edema - Plan: PT plan of care cert/re-cert  Pain in left ankle and joints of left foot - Plan: PT plan of care cert/re-cert  History of fall - Plan: PT plan of care cert/re-cert     Problem List Patient Active Problem List   Diagnosis Date Noted   Displaced bimalleolar fracture of left lower leg, subsequent encounter for closed fracture with delayed healing 09/04/2019   Displaced bimalleolar fracture of left lower leg, initial encounter for closed fracture 05/15/2019   Ankle syndesmosis disruption, left, initial encounter 05/15/2019   Migraine without aura and without status migrainosus, not intractable 07/22/2018   Visit for routine gyn exam 02/08/2017   Anxiety and depression 01/20/2014   Encounter for other general counseling or advice on contraception 01/20/2014      Laureen Abrahams, PT, DPT 09/04/19 12:30 PM    [facility] [address] [place], [place], [address] Phone: [phone]   Fax:  [phone]  Name: Kylie Plumb  Ware MRN: [id number] Date of Birth: 12-Sep-1995

## 2019-09-11 ENCOUNTER — Other Ambulatory Visit: Payer: Self-pay

## 2019-09-11 ENCOUNTER — Encounter: Payer: Self-pay | Admitting: Physical Therapy

## 2019-09-11 ENCOUNTER — Ambulatory Visit (INDEPENDENT_AMBULATORY_CARE_PROVIDER_SITE_OTHER): Payer: Federal, State, Local not specified - PPO | Admitting: Physical Therapy

## 2019-09-11 DIAGNOSIS — R2681 Unsteadiness on feet: Secondary | ICD-10-CM | POA: Diagnosis not present

## 2019-09-11 DIAGNOSIS — R2689 Other abnormalities of gait and mobility: Secondary | ICD-10-CM

## 2019-09-11 DIAGNOSIS — R293 Abnormal posture: Secondary | ICD-10-CM | POA: Diagnosis not present

## 2019-09-11 DIAGNOSIS — M25672 Stiffness of left ankle, not elsewhere classified: Secondary | ICD-10-CM

## 2019-09-11 DIAGNOSIS — M6281 Muscle weakness (generalized): Secondary | ICD-10-CM | POA: Diagnosis not present

## 2019-09-11 NOTE — Therapy (Signed)
[place], [place], [address] Phone: [phone]   Fax:  [phone]  Physical Therapy Treatment  Patient Details  Name: Kylie Ware MRN: [id number] Date of Birth: 11/28/1995 Referring Provider (PT): Frankey Shown, MD   Encounter Date: 09/11/2019   PT End of Session - 09/11/19 1216    Visit Number 21    Number of Visits 24    Date for PT Re-Evaluation 10/02/19    Authorization Type $25 co-pay, ded $350 met, OOP max $5000, $719.13 remaining, 75 PT visit limit per year    PT Start Time 1135    PT Stop Time 1215    PT Time Calculation (min) 40 min    Activity Tolerance Patient tolerated treatment well    Behavior During Therapy [facility] for tasks assessed/performed           Past Medical History:  Diagnosis Date  . Anxiety   . Back pain    pt states d/t "slipped disc"  . Depression   . Migraines     Past Surgical History:  Procedure Laterality Date  . ORIF ANKLE FRACTURE Left 05/15/2019   Procedure: OPEN REDUCTION INTERNAL FIXATION (ORIF) LEFT BIMALLEOLAR ANKLE AND SYNDESMOSIS;  Surgeon: Leandrew Koyanagi, MD;  Location: [place];  Service: Orthopedics;  Laterality: Left;  . WISDOM TOOTH EXTRACTION      There were no vitals filed for this visit.   Subjective Assessment - 09/11/19 1134    Subjective doing well; no complaints    Pertinent History anxiety, depression, migraines, back pain    Limitations Walking;Standing;House hold activities    How long can you stand comfortably? 15 minutes    Patient Stated Goals to be walk again, full range & function in ankle back to where it was    Currently in Pain? No/denies    Pain Onset More than a month ago    Pain Onset More than a month ago                             [facility] Adult PT Treatment/Exercise - 09/11/19 1136      Knee/Hip Exercises: Aerobic   Nustep L7 BLEs only x 8 min - working on keeping Lt heel down      Knee/Hip  Exercises: Machines for Strengthening   Total Gym Leg Press LLE calf raise 18# x 20 reps; LLE # 3x10only 93      Knee/Hip Exercises: Plyometrics   Unilateral Jumping 3 sets;10 reps    Unilateral Jumping Limitations 18# on shuttle LLE only      Knee/Hip Exercises: Standing   Functional Squat 3 sets;10 reps   10# KB   Functional Squat Limitations on ramp, facing downhill    Other Standing Knee Exercises agility ladder x 8 min; skipping 30' x 6 - min cues needed for technique    Other Standing Knee Exercises LLE single limb deadlift 10# 3x10      Ankle Exercises: Stretches   Slant Board Stretch 3 reps;30 seconds   knee extended and flexed     Ankle Exercises: Standing   Heel Raises 20 reps   focus on LLE use   Braiding (Round Trip) 30' x 2; adding hop as able                    PT Short Term Goals - 08/12/19 0841      PT SHORT TERM  GOAL #1   Title Patient demonstrates & verbalizes understanding of initial HEP. (All STGs Target Date 08/01/2019)    Time 5    Period Weeks    Status Achieved    Target Date 08/01/19      PT SHORT TERM GOAL #2   Title Left ankle PROM dorsiflexion >10* & plantarflexion >40*    Baseline 6/15: plantarflexion met, DF not met to date    Time 5    Period Weeks    Status Achieved    Target Date 08/01/19      PT SHORT TERM GOAL #3   Title Patient ambulates 300' with crutches PWB modified independent.    Time 5    Period Weeks    Status Achieved    Target Date 08/01/19      PT SHORT TERM GOAL #4   Title Patient negotiates stairs with single rail & crutch, ramps & curbs with crutches PWB with supervision.    Baseline MET 08/05/2019    Time 5    Period Weeks    Status Achieved    Target Date 08/01/19      PT SHORT TERM GOAL #5   Title left ankle pain </= 3/10 with activities    Time 5    Period Weeks    Status Achieved    Target Date 08/01/19             PT Long Term Goals - 09/04/19 1223      PT LONG TERM GOAL #1   Title  Patient verbalizes & demonstrates understanding of ongoing HEP / fitness plan. (All LTGs Target Date: 10/02/2019)    Time 4    Period Weeks    Status On-going    Target Date 10/02/19      PT LONG TERM GOAL #2   Title Left Ankle AROM Dorsiflexion 15*, Plantarflexion 60*    Baseline MET 09/01/2019    Time 10    Period Weeks    Status Achieved      PT LONG TERM GOAL #3   Title Patient ambulates >500' modified independent LRAD with full WB or WBAT in compliance with MD orders.    Time 10    Period Weeks    Status Achieved      PT LONG TERM GOAL #4   Title Patient negotiates stairs single rail, ramps & curbs modified independent LRAD with full WB or WBAT in compliance with MD orders.    Time 10    Period Weeks    Status Achieved      PT LONG TERM GOAL #5   Title Berg Balance >45/56 to indicate lower fall risk    Baseline MET 08/18/2019    Time 10    Period Weeks    Status Achieved      Additional Long Term Goals   Additional Long Term Goals Yes      PT LONG TERM GOAL #6   Title Patient reports left ankle pain </= 1/10 and low back pain at prior level or better.    Baseline MET 09/01/2019    Time 10    Period Weeks    Status Achieved      PT LONG TERM GOAL #7   Title demonstrate 5/5 Lt ankle strength for improved function    Status New    Target Date 10/02/19      PT LONG TERM GOAL #8   Title perform LLE SLS on compliant surface > 10 sec for improved  stability and balance    Status New    Target Date 10/02/19      PT LONG TERM GOAL  #9   TITLE demonstrate improved agility by performing at least 4" bil jumping without pain or deviations    Status New    Target Date 10/02/19                 Plan - 09/11/19 1216    Clinical Impression Statement Pt tolerated session well today with expected soreness and difficulty with agility and jumping activities.  Will continue to benefit from PT to maximize function.    Personal Factors and Comorbidities Comorbidity 3+     Comorbidities anxiety, depression, migraines, back pain    Examination-Activity Limitations Locomotion Level;Squat;Stairs;Stand;Transfers    Examination-Participation Restrictions Driving    Stability/Clinical Decision Making Evolving/Moderate complexity    Rehab Potential Good    PT Frequency 1x / week   2-3x/week   PT Duration 4 weeks   10 weeks   PT Treatment/Interventions ADLs/Self Care Home Management;Cryotherapy;Electrical Stimulation;DME Instruction;Gait training;Stair training;Functional mobility training;Therapeutic activities;Therapeutic exercise;Balance training;Neuromuscular re-education;Patient/family education;Manual techniques;Scar mobilization;Passive range of motion;Dry needling;Vasopneumatic Device;Taping    PT Next Visit Plan continue strengthening/balance/agility    PT Home Exercise Plan [email or web address]    Consulted and Agree with Plan of Care Patient           Patient will benefit from skilled therapeutic intervention in order to improve the following deficits and impairments:  Abnormal gait, Decreased balance, Decreased endurance, Decreased mobility, Decreased range of motion, Decreased scar mobility, Decreased strength, Increased edema, Obesity, Pain  Visit Diagnosis: Unsteadiness on feet  Other abnormalities of gait and mobility  Abnormal posture  Muscle weakness (generalized)  Stiffness of left ankle, not elsewhere classified     Problem List Patient Active Problem List   Diagnosis Date Noted  . Displaced bimalleolar fracture of left lower leg, subsequent encounter for closed fracture with delayed healing 09/04/2019  . Displaced bimalleolar fracture of left lower leg, initial encounter for closed fracture 05/15/2019  . Ankle syndesmosis disruption, left, initial encounter 05/15/2019  . Migraine without aura and without status migrainosus, not intractable 07/22/2018  . Visit for routine gyn exam 02/08/2017  . Anxiety and depression 01/20/2014  . Encounter for  other general counseling or advice on contraception 01/20/2014       Laureen Abrahams, PT, DPT 09/11/19 12:20 PM     [facility] [address] [place], [place], [address] Phone: [phone]   Fax:  [phone]  Name: Kylie Ware MRN: [id number] Date of Birth: Jun 12, 1995

## 2019-09-15 ENCOUNTER — Other Ambulatory Visit: Payer: Self-pay

## 2019-09-15 ENCOUNTER — Encounter: Payer: Self-pay | Admitting: Rehabilitative and Restorative Service Providers"

## 2019-09-15 ENCOUNTER — Ambulatory Visit: Payer: Federal, State, Local not specified - PPO | Admitting: Rehabilitative and Restorative Service Providers"

## 2019-09-15 DIAGNOSIS — M25672 Stiffness of left ankle, not elsewhere classified: Secondary | ICD-10-CM | POA: Diagnosis not present

## 2019-09-15 DIAGNOSIS — M25572 Pain in left ankle and joints of left foot: Secondary | ICD-10-CM | POA: Diagnosis not present

## 2019-09-15 DIAGNOSIS — M6281 Muscle weakness (generalized): Secondary | ICD-10-CM

## 2019-09-15 DIAGNOSIS — R2689 Other abnormalities of gait and mobility: Secondary | ICD-10-CM | POA: Diagnosis not present

## 2019-09-15 DIAGNOSIS — R600 Localized edema: Secondary | ICD-10-CM

## 2019-09-15 NOTE — Therapy (Signed)
[place], [place], [address] Phone: [phone]   Fax:  [phone]  Physical Therapy Treatment  Patient Details  Name: Kylie Ware MRN: [id number] Date of Birth: Jan 18, 1996 Referring Provider (PT): Frankey Shown, MD   Encounter Date: 09/15/2019   PT End of Session - 09/15/19 1056    Visit Number 22    Number of Visits 24    Date for PT Re-Evaluation 10/02/19    Authorization Type $25 co-pay, ded $350 met, OOP max $5000, $719.13 remaining, 75 PT visit limit per year    PT Start Time 1001    PT Stop Time 1043    PT Time Calculation (min) 42 min    Activity Tolerance Patient tolerated treatment well;No increased pain    Behavior During Therapy WFL for tasks assessed/performed           Past Medical History:  Diagnosis Date   Anxiety    Back pain    pt states d/t "slipped disc"   Depression    Migraines     Past Surgical History:  Procedure Laterality Date   ORIF ANKLE FRACTURE Left 05/15/2019   Procedure: OPEN REDUCTION INTERNAL FIXATION (ORIF) LEFT BIMALLEOLAR ANKLE AND SYNDESMOSIS;  Surgeon: Leandrew Koyanagi, MD;  Location: [place];  Service: Orthopedics;  Laterality: Left;   WISDOM TOOTH EXTRACTION      There were no vitals filed for this visit.   Subjective Assessment - 09/15/19 1053    Subjective Kylie Ware is motivated to get back to hiking.  Standing and walking are limited to 30 minutes.    Pertinent History anxiety, depression, migraines, back pain    Limitations Walking;Standing;House hold activities    How long can you stand comfortably? Up to 30 minutes    Patient Stated Goals to be walk again, full range & function in ankle back to where it was    Currently in Pain? No/denies    Pain Onset More than a month ago    Pain Frequency Intermittent    Aggravating Factors  > 30 minutes activity    Pain Relieving Factors Moderate activity and ice    Multiple Pain Sites No    Pain  Onset More than a month ago    Pain Frequency Intermittent    Aggravating Factors  Prolonged weight-bearing    Pain Relieving Factors Exercises and relative rest    Effect of Pain on Daily Activities Unable to stand/walk more than 30 minutes                             [facility] Adult PT Treatment/Exercise - 09/15/19 0001      Neuro Re-ed    Neuro Re-ed Details  Heel to toe balance (eyes open/closed and closed with head rotating) 1X 30 seconds each; single leg stance eyes open/closed 2X 30 seconds each      Exercises   Exercises Ankle      Knee/Hip Exercises: Stretches   Gastroc Stretch 3 reps;60 seconds   On slant board   Soleus Stretch 3 reps;60 seconds   On slant board     Knee/Hip Exercises: Standing   Heel Raises 3 sets;10 reps;3 seconds   SLOW ECCENTRICS!!!     Ankle Exercises: Seated   Other Seated Ankle Exercises Theraband Inv/Ev 2 sets of 10 with slow eccentrics Blue  PT Education - 09/15/19 1055    Education Details Review HEP.  Encouraged 100/day heel raises and a heel cords box (slant board) for HC stretching at home.    Person(s) Educated Patient    Methods Explanation;Demonstration;Verbal cues    Comprehension Verbalized understanding;Returned demonstration;Verbal cues required;Need further instruction            PT Short Term Goals - 08/12/19 0841      PT SHORT TERM GOAL #1   Title Patient demonstrates & verbalizes understanding of initial HEP. (All STGs Target Date 08/01/2019)    Time 5    Period Weeks    Status Achieved    Target Date 08/01/19      PT SHORT TERM GOAL #2   Title Left ankle PROM dorsiflexion >10* & plantarflexion >40*    Baseline 6/15: plantarflexion met, DF not met to date    Time 5    Period Weeks    Status Achieved    Target Date 08/01/19      PT SHORT TERM GOAL #3   Title Patient ambulates 300' with crutches PWB modified independent.    Time 5    Period Weeks    Status Achieved     Target Date 08/01/19      PT SHORT TERM GOAL #4   Title Patient negotiates stairs with single rail & crutch, ramps & curbs with crutches PWB with supervision.    Baseline MET 08/05/2019    Time 5    Period Weeks    Status Achieved    Target Date 08/01/19      PT SHORT TERM GOAL #5   Title left ankle pain </= 3/10 with activities    Time 5    Period Weeks    Status Achieved    Target Date 08/01/19             PT Long Term Goals - 09/04/19 1223      PT LONG TERM GOAL #1   Title Patient verbalizes & demonstrates understanding of ongoing HEP / fitness plan. (All LTGs Target Date: 10/02/2019)    Time 4    Period Weeks    Status On-going    Target Date 10/02/19      PT LONG TERM GOAL #2   Title Left Ankle AROM Dorsiflexion 15*, Plantarflexion 60*    Baseline MET 09/01/2019    Time 10    Period Weeks    Status Achieved      PT LONG TERM GOAL #3   Title Patient ambulates >500' modified independent LRAD with full WB or WBAT in compliance with MD orders.    Time 10    Period Weeks    Status Achieved      PT LONG TERM GOAL #4   Title Patient negotiates stairs single rail, ramps & curbs modified independent LRAD with full WB or WBAT in compliance with MD orders.    Time 10    Period Weeks    Status Achieved      PT LONG TERM GOAL #5   Title Berg Balance >45/56 to indicate lower fall risk    Baseline MET 08/18/2019    Time 10    Period Weeks    Status Achieved      Additional Long Term Goals   Additional Long Term Goals Yes      PT LONG TERM GOAL #6   Title Patient reports left ankle pain </= 1/10 and low back pain at prior level or  better.    Baseline MET 09/01/2019    Time 10    Period Weeks    Status Achieved      PT LONG TERM GOAL #7   Title demonstrate 5/5 Lt ankle strength for improved function    Status New    Target Date 10/02/19      PT LONG TERM GOAL #8   Title perform LLE SLS on compliant surface > 10 sec for improved stability and balance    Status  New    Target Date 10/02/19      PT LONG TERM GOAL  #9   TITLE demonstrate improved agility by performing at least 4" bil jumping without pain or deviations    Status New    Target Date 10/02/19                 Plan - 09/15/19 1056    Clinical Impression Statement Kylie Ware is making good overall progress towards long-term goals established at evaluation.  Ankle strength remains the focus of her home and clinic program and gains in this area will improve her 30 minute max with comfortable standing and walking.  Continue strength work with transfer into independent PT when appropriate.    Personal Factors and Comorbidities Comorbidity 3+    Comorbidities anxiety, depression, migraines, back pain    Examination-Activity Limitations Locomotion Level;Squat;Stairs;Stand;Transfers    Examination-Participation Restrictions Driving    Stability/Clinical Decision Making Evolving/Moderate complexity    Rehab Potential Good    PT Frequency 1x / week   2-3x/week   PT Duration 4 weeks   10 weeks   PT Treatment/Interventions ADLs/Self Care Home Management;Cryotherapy;Electrical Stimulation;DME Instruction;Gait training;Stair training;Functional mobility training;Therapeutic activities;Therapeutic exercise;Balance training;Neuromuscular re-education;Patient/family education;Manual techniques;Scar mobilization;Passive range of motion;Dry needling;Vasopneumatic Device;Taping    PT Next Visit Plan continue strengthening/balance/agility    PT Home Exercise Plan [facility]    Consulted and Agree with Plan of Care Patient           Patient will benefit from skilled therapeutic intervention in order to improve the following deficits and impairments:  Abnormal gait, Decreased balance, Decreased endurance, Decreased mobility, Decreased range of motion, Decreased scar mobility, Decreased strength, Increased edema, Obesity, Pain  Visit Diagnosis: Other abnormalities of gait and mobility  Muscle weakness  (generalized)  Stiffness of left ankle, not elsewhere classified  Pain in left ankle and joints of left foot  Localized edema     Problem List Patient Active Problem List   Diagnosis Date Noted   Displaced bimalleolar fracture of left lower leg, subsequent encounter for closed fracture with delayed healing 09/04/2019   Displaced bimalleolar fracture of left lower leg, initial encounter for closed fracture 05/15/2019   Ankle syndesmosis disruption, left, initial encounter 05/15/2019   Migraine without aura and without status migrainosus, not intractable 07/22/2018   Visit for routine gyn exam 02/08/2017   Anxiety and depression 01/20/2014   Encounter for other general counseling or advice on contraception 01/20/2014    Farley Ly PT, MPT 09/15/2019, [place] [address] [place], [place], [address] Phone: [phone]   Fax:  [phone]  Name: Kylie Ware MRN: [id number] Date of Birth: Mar 30, 1995

## 2019-09-22 ENCOUNTER — Encounter: Payer: Federal, State, Local not specified - PPO | Admitting: Physical Therapy

## 2019-09-26 DIAGNOSIS — Z20822 Contact with and (suspected) exposure to covid-19: Secondary | ICD-10-CM | POA: Diagnosis not present

## 2019-09-29 ENCOUNTER — Ambulatory Visit: Payer: Federal, State, Local not specified - PPO | Admitting: Physical Therapy

## 2019-09-29 ENCOUNTER — Encounter: Payer: Self-pay | Admitting: Physical Therapy

## 2019-09-29 ENCOUNTER — Other Ambulatory Visit: Payer: Self-pay

## 2019-09-29 DIAGNOSIS — R2689 Other abnormalities of gait and mobility: Secondary | ICD-10-CM | POA: Diagnosis not present

## 2019-09-29 DIAGNOSIS — M6281 Muscle weakness (generalized): Secondary | ICD-10-CM

## 2019-09-29 DIAGNOSIS — R2681 Unsteadiness on feet: Secondary | ICD-10-CM

## 2019-09-29 DIAGNOSIS — M25672 Stiffness of left ankle, not elsewhere classified: Secondary | ICD-10-CM | POA: Diagnosis not present

## 2019-09-29 DIAGNOSIS — R600 Localized edema: Secondary | ICD-10-CM

## 2019-09-29 DIAGNOSIS — M25572 Pain in left ankle and joints of left foot: Secondary | ICD-10-CM

## 2019-09-29 NOTE — Therapy (Signed)
[place], [place], [address] Phone: [phone]   Fax:  [phone]  Physical Therapy Treatment  Patient Details  Name: Kylie Ware MRN: [id number] Date of Birth: 20-Sep-1995 Referring Provider (PT): Frankey Shown, MD   Encounter Date: 09/29/2019   PT End of Session - 09/29/19 1055    Visit Number 23    Number of Visits 24    Date for PT Re-Evaluation 10/09/19   extended due to missed week   Authorization Type $25 co-pay, ded $350 met, OOP max $5000, $719.13 remaining, 75 PT visit limit per year    PT Start Time 1015    PT Stop Time 1053    PT Time Calculation (min) 38 min    Activity Tolerance Patient tolerated treatment well;No increased pain    Behavior During Therapy WFL for tasks assessed/performed           Past Medical History:  Diagnosis Date   Anxiety    Back pain    pt states d/t "slipped disc"   Depression    Migraines     Past Surgical History:  Procedure Laterality Date   ORIF ANKLE FRACTURE Left 05/15/2019   Procedure: OPEN REDUCTION INTERNAL FIXATION (ORIF) LEFT BIMALLEOLAR ANKLE AND SYNDESMOSIS;  Surgeon: Leandrew Koyanagi, MD;  Location: [place];  Service: Orthopedics;  Laterality: Left;   WISDOM TOOTH EXTRACTION      There were no vitals filed for this visit.   Subjective Assessment - 09/29/19 1018    Subjective doing well - no brace today. had some muscle spasms over the weekend    Pertinent History anxiety, depression, migraines, back pain    Limitations Walking;Standing;House hold activities    How long can you stand comfortably? Up to 30 minutes    Patient Stated Goals to be walk again, full range & function in ankle back to where it was    Currently in Pain? No/denies                             [facility] Adult PT Treatment/Exercise - 09/29/19 1019      Knee/Hip Exercises: Machines for Strengthening   Cybex Knee Extension LLE 15# 3x10     Cybex Knee Flexion LLE 20# 3x10      Ankle Exercises: Aerobic   Recumbent Bike L3 x 6 min      Ankle Exercises: Stretches   Slant Board Stretch 3 reps;30 seconds   gastroc and soleus     Ankle Exercises: Standing   SLS on level ground LLE with ball toss x 20 - intermittent RLE toe tap for balance    Heel Raises Left   3x10   Other Standing Ankle Exercises LLE step up onto 4" step with foam - with RLE into partial march 3x10; lateral and forward step ups    Other Standing Ankle Exercises crossovers on step with 2 risers- 2x10                    PT Short Term Goals - 08/12/19 0841      PT SHORT TERM GOAL #1   Title Patient demonstrates & verbalizes understanding of initial HEP. (All STGs Target Date 08/01/2019)    Time 5    Period Weeks    Status Achieved    Target Date 08/01/19      PT SHORT TERM GOAL #2   Title Left ankle PROM  dorsiflexion >10* & plantarflexion >40*    Baseline 6/15: plantarflexion met, DF not met to date    Time 5    Period Weeks    Status Achieved    Target Date 08/01/19      PT SHORT TERM GOAL #3   Title Patient ambulates 300' with crutches PWB modified independent.    Time 5    Period Weeks    Status Achieved    Target Date 08/01/19      PT SHORT TERM GOAL #4   Title Patient negotiates stairs with single rail & crutch, ramps & curbs with crutches PWB with supervision.    Baseline MET 08/05/2019    Time 5    Period Weeks    Status Achieved    Target Date 08/01/19      PT SHORT TERM GOAL #5   Title left ankle pain </= 3/10 with activities    Time 5    Period Weeks    Status Achieved    Target Date 08/01/19             PT Long Term Goals - 09/29/19 1056      PT LONG TERM GOAL #1   Title Patient verbalizes & demonstrates understanding of ongoing HEP / fitness plan. (All LTGs Target Date: 10/02/2019)    Time 4    Period Weeks    Status On-going    Target Date 10/10/19   extended due to missed week     PT LONG TERM GOAL #2    Title Left Ankle AROM Dorsiflexion 15*, Plantarflexion 60*    Baseline MET 09/01/2019    Time 10    Period Weeks    Status Achieved      PT LONG TERM GOAL #3   Title Patient ambulates >500' modified independent LRAD with full WB or WBAT in compliance with MD orders.    Time 10    Period Weeks    Status Achieved      PT LONG TERM GOAL #4   Title Patient negotiates stairs single rail, ramps & curbs modified independent LRAD with full WB or WBAT in compliance with MD orders.    Time 10    Period Weeks    Status Achieved      PT LONG TERM GOAL #5   Title Berg Balance >45/56 to indicate lower fall risk    Baseline MET 08/18/2019    Time 10    Period Weeks    Status Achieved      PT LONG TERM GOAL #6   Title Patient reports left ankle pain </= 1/10 and low back pain at prior level or better.    Baseline MET 09/01/2019    Time 10    Period Weeks    Status Achieved      PT LONG TERM GOAL #7   Title demonstrate 5/5 Lt ankle strength for improved function    Status New      PT LONG TERM GOAL #8   Title perform LLE SLS on compliant surface > 10 sec for improved stability and balance    Status New      PT LONG TERM GOAL  #9   TITLE demonstrate improved agility by performing at least 4" bil jumping without pain or deviations    Status New                 Plan - 09/29/19 1056    Clinical Impression Statement Pt progressing well and  feels she is nearing d/c at this time.  Plan to d/c next week and continue with HEP and overall strengthening and endurance to maximize function.  Will continue to benefit from PT to maximize function.    Personal Factors and Comorbidities Comorbidity 3+    Comorbidities anxiety, depression, migraines, back pain    Examination-Activity Limitations Locomotion Level;Squat;Stairs;Stand;Transfers    Examination-Participation Restrictions Driving    Stability/Clinical Decision Making Evolving/Moderate complexity    Rehab Potential Good    PT  Frequency 1x / week   2-3x/week   PT Duration 4 weeks   10 weeks   PT Treatment/Interventions ADLs/Self Care Home Management;Cryotherapy;Electrical Stimulation;DME Instruction;Gait training;Stair training;Functional mobility training;Therapeutic activities;Therapeutic exercise;Balance training;Neuromuscular re-education;Patient/family education;Manual techniques;Scar mobilization;Passive range of motion;Dry needling;Vasopneumatic Device;Taping    PT Next Visit Plan check remaining goals, plan for d/c, address any final needs    PT [place] and Agree with Plan of Care Patient           Patient will benefit from skilled therapeutic intervention in order to improve the following deficits and impairments:  Abnormal gait, Decreased balance, Decreased endurance, Decreased mobility, Decreased range of motion, Decreased scar mobility, Decreased strength, Increased edema, Obesity, Pain  Visit Diagnosis: Other abnormalities of gait and mobility  Muscle weakness (generalized)  Stiffness of left ankle, not elsewhere classified  Pain in left ankle and joints of left foot  Localized edema  Unsteadiness on feet     Problem List Patient Active Problem List   Diagnosis Date Noted   Displaced bimalleolar fracture of left lower leg, subsequent encounter for closed fracture with delayed healing 09/04/2019   Displaced bimalleolar fracture of left lower leg, initial encounter for closed fracture 05/15/2019   Ankle syndesmosis disruption, left, initial encounter 05/15/2019   Migraine without aura and without status migrainosus, not intractable 07/22/2018   Visit for routine gyn exam 02/08/2017   Anxiety and depression 01/20/2014   Encounter for other general counseling or advice on contraception 01/20/2014      Laureen Abrahams, PT, DPT 09/29/19 10:58 AM    [facility] [address] [place], [place],  [address] Phone: [phone]   Fax:  [phone]  Name: CLODAGH ODENTHAL MRN: [id number] Date of Birth: [birth year]

## 2019-10-06 ENCOUNTER — Encounter: Payer: Self-pay | Admitting: Physical Therapy

## 2019-10-06 ENCOUNTER — Other Ambulatory Visit: Payer: Self-pay

## 2019-10-06 ENCOUNTER — Ambulatory Visit: Payer: Federal, State, Local not specified - PPO | Admitting: Physical Therapy

## 2019-10-06 DIAGNOSIS — M25672 Stiffness of left ankle, not elsewhere classified: Secondary | ICD-10-CM | POA: Diagnosis not present

## 2019-10-06 DIAGNOSIS — M25572 Pain in left ankle and joints of left foot: Secondary | ICD-10-CM | POA: Diagnosis not present

## 2019-10-06 DIAGNOSIS — M6281 Muscle weakness (generalized): Secondary | ICD-10-CM

## 2019-10-06 DIAGNOSIS — R2689 Other abnormalities of gait and mobility: Secondary | ICD-10-CM

## 2019-10-06 DIAGNOSIS — R600 Localized edema: Secondary | ICD-10-CM

## 2019-10-06 DIAGNOSIS — R2681 Unsteadiness on feet: Secondary | ICD-10-CM

## 2019-10-06 NOTE — Therapy (Signed)
[place], [place], [address] Phone: [phone]   Fax:  [phone]  Physical Therapy Treatment/Discharge Summary  Patient Details  Name: Kylie Ware MRN: [id number] Date of Birth: 04-03-1995 Referring Provider (PT): Frankey Shown, MD   Encounter Date: 10/06/2019   PT End of Session - 10/06/19 1047    Visit Number 24    Number of Visits 24    Date for PT Re-Evaluation 10/09/19   extended due to missed week   Authorization Type $25 co-pay, ded $350 met, OOP max $5000, $719.13 remaining, 75 PT visit limit per year    PT Start Time 1015    PT Stop Time 1042    PT Time Calculation (min) 27 min    Activity Tolerance Patient tolerated treatment well;No increased pain    Behavior During Therapy WFL for tasks assessed/performed           Past Medical History:  Diagnosis Date  . Anxiety   . Back pain    pt states d/t "slipped disc"  . Depression   . Migraines     Past Surgical History:  Procedure Laterality Date  . ORIF ANKLE FRACTURE Left 05/15/2019   Procedure: OPEN REDUCTION INTERNAL FIXATION (ORIF) LEFT BIMALLEOLAR ANKLE AND SYNDESMOSIS;  Surgeon: Leandrew Koyanagi, MD;  Location: [facility];  Service: Orthopedics;  Laterality: Left;  . WISDOM TOOTH EXTRACTION      There were no vitals filed for this visit.   Subjective Assessment - 10/06/19 1017    Subjective doing well, ready for d/c today.    Pertinent History anxiety, depression, migraines, back pain    Limitations Walking;Standing;House hold activities    How long can you stand comfortably? Up to 30 minutes    Patient Stated Goals to be walk again, full range & function in ankle back to where it was    Currently in Pain? No/denies              [facility] PT Assessment - 10/06/19 1029      Assessment   Medical Diagnosis left ankle fracture with ORIF    Referring Provider (PT) Frankey Shown, MD    Onset Date/Surgical Date 05/15/19       Functional Tests   Functional tests Single leg stance;Hopping      Hopping   Comments attempted 2" and 4" - unable due to fear      Single Leg Stance   Comments LLE up to 10 sec on compliant surface      Strength   Left Ankle Dorsiflexion 5/5    Left Ankle Plantar Flexion 4/5    Left Ankle Inversion 5/5    Left Ankle Eversion 4+/5                         [facility] Adult PT Treatment/Exercise - 10/06/19 1018      Knee/Hip Exercises: Stretches   Gastroc Stretch Left;3 reps;30 seconds    Gastroc Stretch Limitations standing at counter      Knee/Hip Exercises: Aerobic   Recumbent Bike L5 x 8 min      Ankle Exercises: Standing   SLS LLE on foam 5 x 5-10 sec    Heel Raises Left;10 reps;Both;5 reps    Other Standing Ankle Exercises reviewed squats and deadlifts for HEP - pt performed deadlifts x 10 reps with L5 band  PT Education - 10/06/19 1047    Education Details updated HEP    Person(s) Educated Patient    Methods Explanation;Demonstration;Handout    Comprehension Verbalized understanding;Returned demonstration            PT Short Term Goals - 08/12/19 0841      PT SHORT TERM GOAL #1   Title Patient demonstrates & verbalizes understanding of initial HEP. (All STGs Target Date 08/01/2019)    Time 5    Period Weeks    Status Achieved    Target Date 08/01/19      PT SHORT TERM GOAL #2   Title Left ankle PROM dorsiflexion >10* & plantarflexion >40*    Baseline 6/15: plantarflexion met, DF not met to date    Time 5    Period Weeks    Status Achieved    Target Date 08/01/19      PT SHORT TERM GOAL #3   Title Patient ambulates 300' with crutches PWB modified independent.    Time 5    Period Weeks    Status Achieved    Target Date 08/01/19      PT SHORT TERM GOAL #4   Title Patient negotiates stairs with single rail & crutch, ramps & curbs with crutches PWB with supervision.    Baseline MET 08/05/2019    Time 5    Period  Weeks    Status Achieved    Target Date 08/01/19      PT SHORT TERM GOAL #5   Title left ankle pain </= 3/10 with activities    Time 5    Period Weeks    Status Achieved    Target Date 08/01/19             PT Long Term Goals - 10/06/19 1047      PT LONG TERM GOAL #1   Title Patient verbalizes & demonstrates understanding of ongoing HEP / fitness plan. (All LTGs Target Date: 10/02/2019)    Time 4    Period Weeks    Status Achieved      PT LONG TERM GOAL #2   Title Left Ankle AROM Dorsiflexion 15*, Plantarflexion 60*    Baseline MET 09/01/2019    Time 10    Period Weeks    Status Achieved      PT LONG TERM GOAL #3   Title Patient ambulates >500' modified independent LRAD with full WB or WBAT in compliance with MD orders.    Time 10    Period Weeks    Status Achieved      PT LONG TERM GOAL #4   Title Patient negotiates stairs single rail, ramps & curbs modified independent LRAD with full WB or WBAT in compliance with MD orders.    Time 10    Period Weeks    Status Achieved      PT LONG TERM GOAL #5   Title Berg Balance >45/56 to indicate lower fall risk    Baseline MET 08/18/2019    Time 10    Period Weeks    Status Achieved      PT LONG TERM GOAL #6   Title Patient reports left ankle pain </= 1/10 and low back pain at prior level or better.    Baseline MET 09/01/2019    Time 10    Period Weeks    Status Achieved      PT LONG TERM GOAL #7   Title demonstrate 5/5 Lt ankle strength for improved function  Status Partially Met      PT LONG TERM GOAL #8   Title perform LLE SLS on compliant surface > 10 sec for improved stability and balance    Status Achieved      PT LONG TERM GOAL  #9   TITLE demonstrate improved agility by performing at least 4" bil jumping without pain or deviations    Status Not Met                 Plan - 10/06/19 1047    Clinical Impression Statement Pt has met/partially met all LTGs and is ready for d/c from pt with  transition to HEP and continued community exercise.  Still has fear with plyometric activities so unable to meet jumping goal, although feel pt would be able to meet this goal.  Will d/c PT today.    Personal Factors and Comorbidities Comorbidity 3+    Comorbidities anxiety, depression, migraines, back pain    Examination-Activity Limitations Locomotion Level;Squat;Stairs;Stand;Transfers    Examination-Participation Restrictions Driving    Stability/Clinical Decision Making Evolving/Moderate complexity    Rehab Potential Good    PT Frequency 1x / week   2-3x/week   PT Duration 4 weeks   10 weeks   PT Treatment/Interventions ADLs/Self Care Home Management;Cryotherapy;Electrical Stimulation;DME Instruction;Gait training;Stair training;Functional mobility training;Therapeutic activities;Therapeutic exercise;Balance training;Neuromuscular re-education;Patient/family education;Manual techniques;Scar mobilization;Passive range of motion;Dry needling;Vasopneumatic Device;Taping    PT Next Visit Plan d/c PT today    PT Home Exercise Plan [email or web address]    Consulted and Agree with Plan of Care Patient           Patient will benefit from skilled therapeutic intervention in order to improve the following deficits and impairments:  Abnormal gait, Decreased balance, Decreased endurance, Decreased mobility, Decreased range of motion, Decreased scar mobility, Decreased strength, Increased edema, Obesity, Pain  Visit Diagnosis: Other abnormalities of gait and mobility  Muscle weakness (generalized)  Stiffness of left ankle, not elsewhere classified  Pain in left ankle and joints of left foot  Localized edema  Unsteadiness on feet     Problem List Patient Active Problem List   Diagnosis Date Noted  . Displaced bimalleolar fracture of left lower leg, subsequent encounter for closed fracture with delayed healing 09/04/2019  . Displaced bimalleolar fracture of left lower leg, initial encounter for  closed fracture 05/15/2019  . Ankle syndesmosis disruption, left, initial encounter 05/15/2019  . Migraine without aura and without status migrainosus, not intractable 07/22/2018  . Visit for routine gyn exam 02/08/2017  . Anxiety and depression 01/20/2014  . Encounter for other general counseling or advice on contraception 01/20/2014      Laureen Abrahams, PT, DPT 10/06/19 10:50 AM    [facility] Physical Therapy [address] [place], [place], [address] Phone: [phone]   Fax:  [phone]  Name: Kylie Ware MRN: [id number] Date of Birth: December 13, 1995      PHYSICAL THERAPY DISCHARGE SUMMARY  Visits from Start of Care: 24  Current functional level related to goals / functional outcomes: See above   Remaining deficits: See above   Education / Equipment: HEP  Plan: Patient agrees to discharge.  Patient goals were partially met. Patient is being discharged due to being pleased with the current functional level.  ?????    Laureen Abrahams, PT, DPT 10/06/19 10:51 AM  [facility] Physical Therapy [address] [place], [place], [address] Phone: [phone]   Fax:  [phone]

## 2019-10-06 NOTE — Patient Instructions (Signed)
Access Code: [email or web address] URL: [email or web address] Date: 10/06/2019 Prepared by: Moshe Cipro  Exercises Standing Gastroc Stretch at Counter - 1 x daily - 7 x weekly - 1 sets - 3 reps - 30 sec hold Heel rises with counter support - 1 x daily - 7 x weekly - 20 reps - 1 sets Single Leg Stance on Foam Pad - 1 x daily - 7 x weekly - 5 reps - 1 sets - 10-15 hold Squat - 1 x daily - 7 x weekly - 3 sets - 10 reps Forward T - 1 x daily - 7 x weekly - 3 sets - 10 reps Deadlift with Resistance - 1 x daily - 7 x weekly - 3 sets - 10 reps

## 2020-05-29 IMAGING — RF DG C-ARM 1-60 MIN
1 series · 4 of 4 positions shown · non-contrast
Comparison: Preoperative radiograph 05/12/2019

CLINICAL DATA: Elective surgery.

EXAM:
DG C-ARM 1-60 MIN; LEFT ANKLE COMPLETE - 3+ VIEW
FLUOROSCOPY TIME:  Fluoroscopy Time:  33 seconds
Radiation Exposure Index (if provided by the fluoroscopic device):
Not applicable
Number of Acquired Spot Images: 4

[Series 1: run · 4 of 4 slices shown]
[im 1/4]
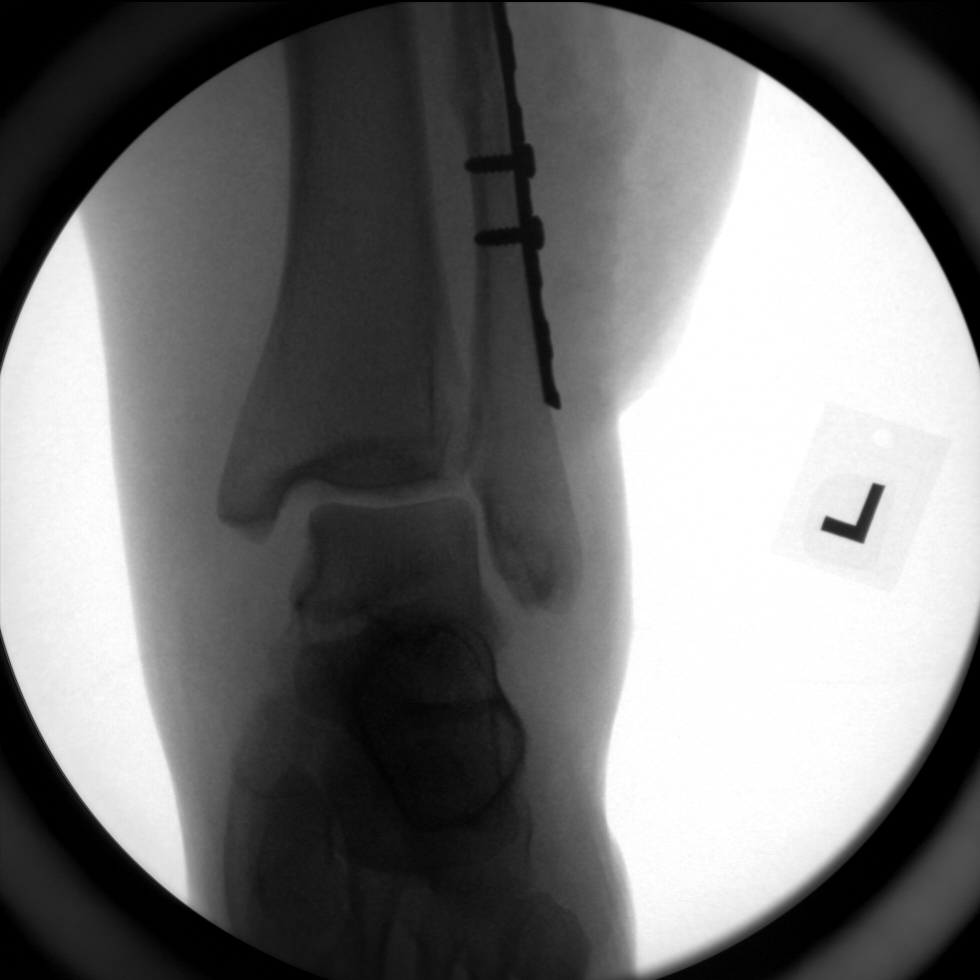
[im 2/4]
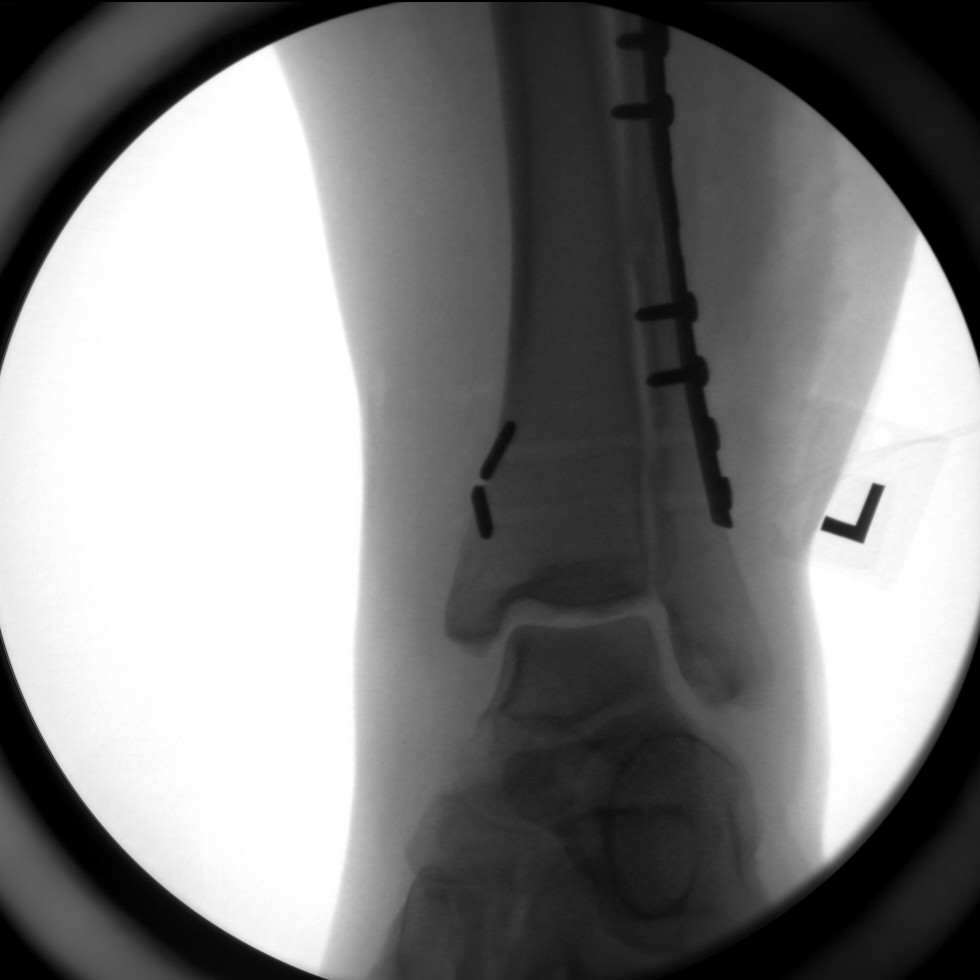
[im 3/4]
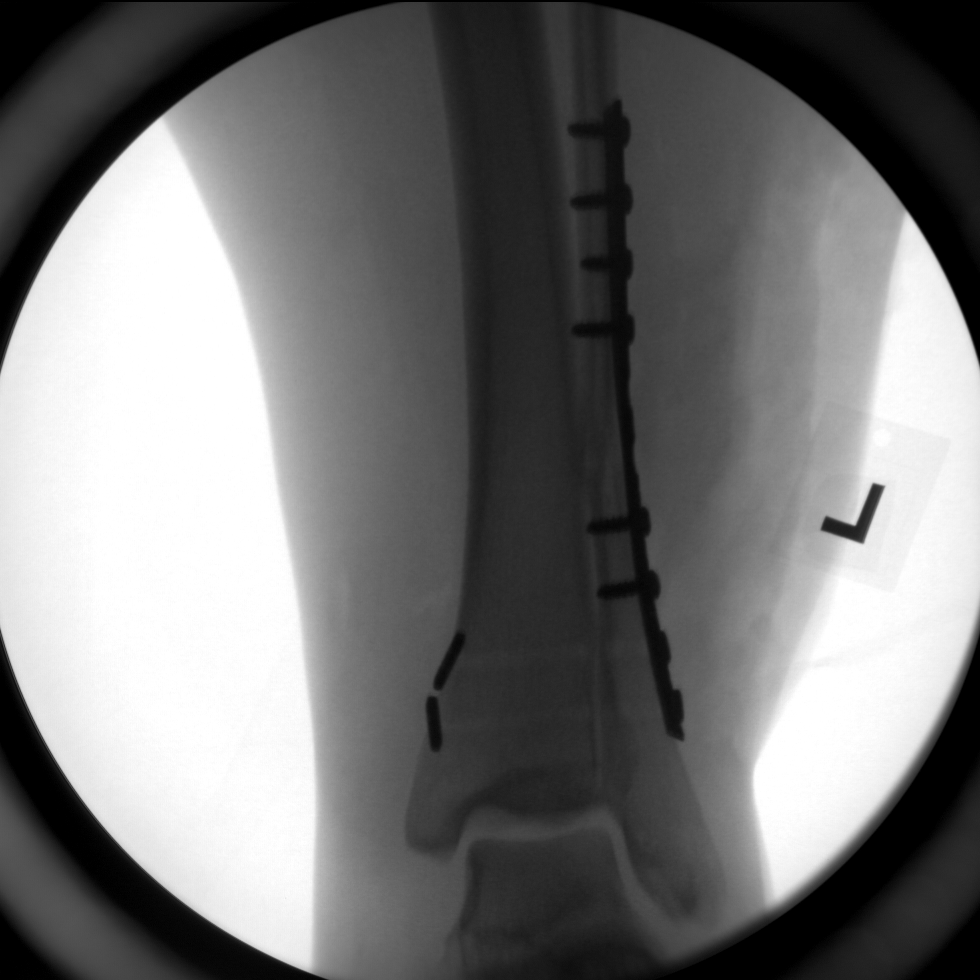
[im 4/4]
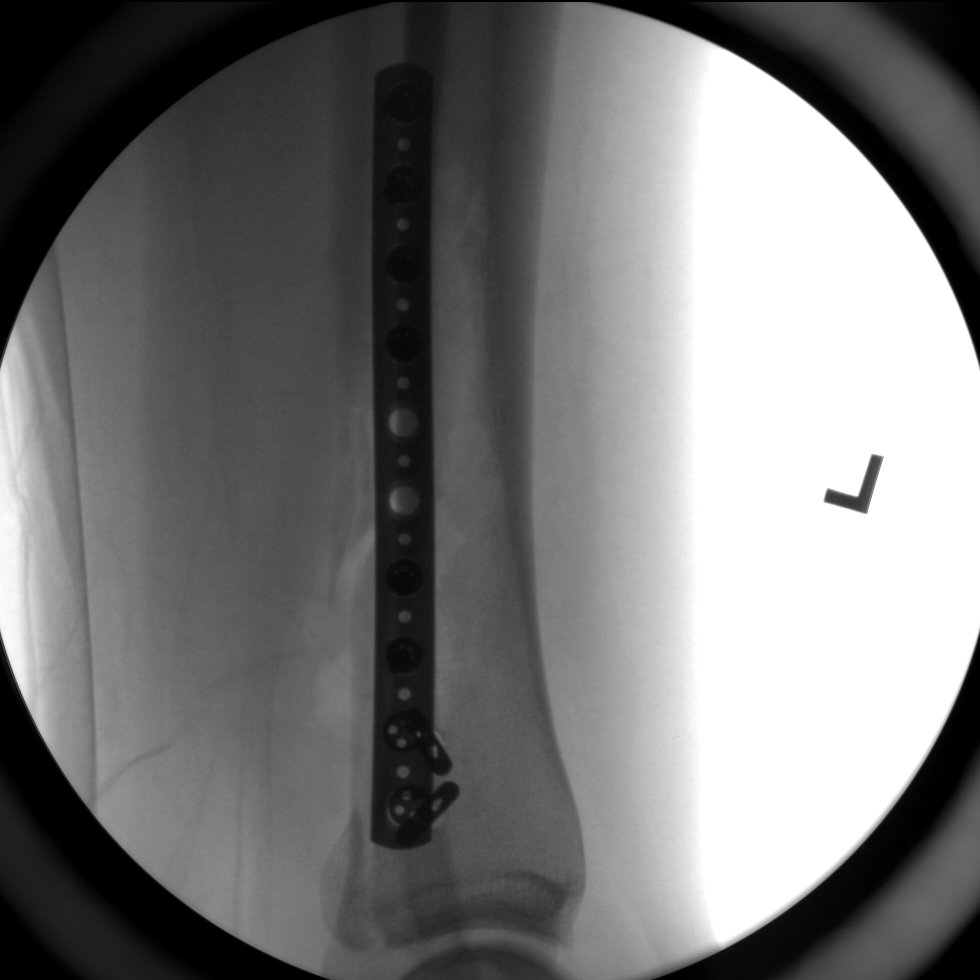

[4 of 4 positions shown; findings below may reference images not displayed]

FINDINGS: Four fluoroscopic spot views obtained in the operating room. Lateral
plate and multi screw fixation of distal fibular fracture. There are
also syndesmotic sutures with buttons about the medial tibia. Distal
tibia fracture in improved alignment. Improved mortise alignment.
Total fluoroscopy time 33 seconds.
IMPRESSION: Intraoperative fluoroscopy during distal fibular fracture fixation.

## 2021-06-27 ENCOUNTER — Ambulatory Visit: Payer: BC Managed Care – PPO | Admitting: Family

## 2021-06-27 ENCOUNTER — Encounter: Payer: Self-pay | Admitting: Family

## 2021-06-27 VITALS — BP 125/80 | HR 87 | Temp 98.4°F | Resp 16 | Ht 67.0 in | Wt 245.0 lb

## 2021-06-27 DIAGNOSIS — M545 Low back pain, unspecified: Secondary | ICD-10-CM | POA: Diagnosis not present

## 2021-06-27 DIAGNOSIS — G43009 Migraine without aura, not intractable, without status migrainosus: Secondary | ICD-10-CM | POA: Diagnosis not present

## 2021-06-27 DIAGNOSIS — F419 Anxiety disorder, unspecified: Secondary | ICD-10-CM | POA: Diagnosis not present

## 2021-06-27 DIAGNOSIS — F32A Depression, unspecified: Secondary | ICD-10-CM

## 2021-06-27 DIAGNOSIS — G8929 Other chronic pain: Secondary | ICD-10-CM | POA: Insufficient documentation

## 2021-06-27 MED ORDER — ESCITALOPRAM OXALATE 10 MG PO TABS: ORAL_TABLET | ORAL | 0 refills | Status: DC

## 2021-06-27 MED ORDER — SUMATRIPTAN SUCCINATE 50 MG PO TABS: ORAL_TABLET | ORAL | 5 refills | Status: DC

## 2021-06-27 NOTE — Assessment & Plan Note (Signed)
Declines referral back to neurology at this time. Advised pt not to overuse ibuprofen. Will give trial of prn imitrex.

## 2021-06-27 NOTE — Assessment & Plan Note (Signed)
Uncontrolled. Will rx with lexapro 10mg . 1/2 tab once daily for 1 week, then increase to a full tab once daily on week two.

## 2021-06-27 NOTE — Assessment & Plan Note (Signed)
Gave pt handout on some exercises she can do at home.

## 2021-06-27 NOTE — Patient Instructions (Signed)
Please start lexapro 10mg . !/2 tab once daily for 1 week, then increase to a full tab once daily on week two.

## 2021-06-27 NOTE — Progress Notes (Signed)
Subjective:   By signing my name below, I, Zite Okoli, attest that this documentation has been prepared under the direction and in the presence of Sandford CrazeO'Sullivan, Aracelys Glade, NP 06/27/2021       Patient ID: Kylie Ware, female    DOB: [birth year], 26 y.o.   MRN: [id number]  Chief Complaint  Patient presents with   New Patient (Initial Visit)    Transsion of care     HPI Patient is in today for establishment of patient care.  Migraines- She reports her migraines are intermittent and she is been having one for the past week.  Often accompanied with light sensitivity. She rates it as a 3/10 and uses ibuprofen to manage the pain and it provides mild relief. She was using propanolol but stopped. She used to see a neurologist but stopped 2 years ago.   Depression/Anxiety- She is currently concerned about depression. She used to take Zoloft and it did not help. Thinks it is the same compared to the past years. Notes that her parents aging is adding to the feeling. She notes she is used to the feelings of depression and tries to work with it. She tried counseling in college but it did not help. Positive for family history of depression.   Back pain- She has a slipped disc in her back. She has tried physical therapy but stopped a few years ago. She is not interested in restarting with them. She walks which helps the pain and performs exercises.   Social history- No recent surgeries. She does not drink alcohol, use drugs or smoke tobacco. She is not sexually active.   She denies having any unexpected weight change, ear pain, hearing loss and rhinorrhea, visual disturbance, cough, chest pain and leg swelling, nausea, vomiting, diarrhea and blood in stool, or dysuria and frequency, for myalgias and arthralgias, rash, adenopathy at this time   Past Medical History:  Diagnosis Date   Anxiety    Back pain    pt states d/t "slipped disc"   Depression    Migraines     Past Surgical History:   Procedure Laterality Date   ORIF ANKLE FRACTURE Left 05/15/2019   Procedure: OPEN REDUCTION INTERNAL FIXATION (ORIF) LEFT BIMALLEOLAR ANKLE AND SYNDESMOSIS;  Surgeon: Tarry KosXu, Naiping M, MD;  Location: [facility];  Service: Orthopedics;  Laterality: Left;   WISDOM TOOTH EXTRACTION      Family History  Problem Relation Age of Onset   Cancer Mother 3960       breast   Hypertension Mother    Diabetes Father 7350       type 6620   Aortic stenosis Father 6020       had AVR in his 8120's   Depression Father    Osteogenesis imperfecta Brother    Depression Brother    Cancer Maternal Grandmother 7260       breast CA   Glaucoma Maternal Grandfather    Heart disease Maternal Grandfather        cad   Cancer Maternal Grandfather        unsure type   Alcoholism Paternal Grandmother    Emphysema Paternal Grandmother    CVA Paternal Grandfather    Dementia Paternal Grandfather     Social History   Socioeconomic History   Marital status: Single    Spouse name: Not on file   Number of children: Not on file   Years of education: Not on file   Highest education level: Not  on file  Occupational History   Not on file  Tobacco Use   Smoking status: Never   Smokeless tobacco: Never  Vaping Use   Vaping Use: Never used  Substance and Sexual Activity   Alcohol use: No    Alcohol/week: 0.0 standard drinks   Drug use: No   Sexual activity: Not Currently  Other Topics Concern   Not on file  Social History Narrative   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   Completed a bachelor's degree   Enjoys reading, video games, [place]   No pets   Social Determinants of Health   Financial Resource Strain: Not on file  Food Insecurity: Not on file  Transportation Needs: Not on file  Physical Activity: Sufficiently Active   Days of Exercise per Week: 3 days   Minutes of Exercise per Session: 60 min  Stress: Not on file  Social Connections: Not on file   Intimate Partner Violence: Not on file    Outpatient Medications Prior to Visit  Medication Sig Dispense Refill   ibuprofen (ADVIL) 200 MG tablet Take 200 mg by mouth every 6 (six) hours as needed.     acetaminophen (TYLENOL) 500 MG tablet Take 500 mg by mouth every 6 (six) hours as needed.     amoxicillin-clavulanate (AUGMENTIN) 875-125 MG tablet Take 1 tablet by mouth 2 (two) times daily. 14 tablet 0   aspirin 81 MG EC tablet TAKE 1 TABLET BY MOUTH EVERY DAY 21 tablet 0   No facility-administered medications prior to visit.    No Known Allergies  Review of Systems  Constitutional:  Negative for fever.  HENT:  Negative for ear pain and hearing loss.        (-)nystagmus (-)adenopathy  Eyes:  Negative for blurred vision.  Respiratory:  Negative for cough, shortness of breath and wheezing.   Cardiovascular:  Negative for chest pain and leg swelling.  Gastrointestinal:  Negative for blood in stool, diarrhea, nausea and vomiting.  Genitourinary:  Negative for dysuria and frequency.  Musculoskeletal:  Positive for back pain. Negative for joint pain and myalgias.  Skin:  Negative for rash.  Neurological:  Negative for headaches.       (+) migraine  Psychiatric/Behavioral:  Positive for depression and suicidal ideas. The patient is nervous/anxious.       Objective:    Physical Exam Constitutional:      General: She is not in acute distress.    Appearance: Normal appearance. She is not ill-appearing.  HENT:     Head: Normocephalic and atraumatic.     Right Ear: External ear normal.     Left Ear: External ear normal.  Cardiovascular:     Rate and Rhythm: Normal rate and regular rhythm.     Pulses: Normal pulses.     Heart sounds: Normal heart sounds. No murmur heard. Pulmonary:     Effort: Pulmonary effort is normal. No respiratory distress.     Breath sounds: Normal breath sounds. No wheezing or rhonchi.  Skin:    General: Skin is warm and dry.  Neurological:     Mental  Status: She is alert and oriented to person, place, and time.  Psychiatric:        Behavior: Behavior normal.        Judgment: Judgment normal.    BP 125/80 (BP Location: Right Arm, Patient Position: Sitting, Cuff Size: Large)   Pulse 87   Temp 98.4 F (  36.9 C) (Oral)   Resp 16   Ht 5\' 7"  (1.702 m)   Wt 245 lb (111.1 kg)   SpO2 100%   BMI 38.37 kg/m  Wt Readings from Last 3 Encounters:  06/27/21 245 lb (111.1 kg)  07/24/19 240 lb (108.9 kg)  07/17/19 240 lb (108.9 kg)     Assessment & Plan:   Problem List Items Addressed This Visit       Unprioritized   Migraine without aura and without status migrainosus, not intractable    Declines referral back to neurology at this time. Advised pt not to overuse ibuprofen. Will give trial of prn imitrex.        Relevant Medications   SUMAtriptan (IMITREX) 50 MG tablet   escitalopram (LEXAPRO) 10 MG tablet   Chronic midline low back pain without sciatica - Primary    Gave pt handout on some exercises she can do at home.        Relevant Medications   escitalopram (LEXAPRO) 10 MG tablet   Anxiety and depression    Uncontrolled. Will rx with lexapro 10mg . 1/2 tab once daily for 1 week, then increase to a full tab once daily on week two.        Relevant Medications   escitalopram (LEXAPRO) 10 MG tablet     Meds ordered this encounter  Medications   SUMAtriptan (IMITREX) 50 MG tablet    Sig: 1 tablet by mouth at start of migraine, may repeat in 2 hours if needed (Max 2 tabs/24 hrs)    Dispense:  10 tablet    Refill:  5    Order Specific Question:   Supervising Provider    Answer:   09/16/19 A [[id number]]   escitalopram (LEXAPRO) 10 MG tablet    Sig: 1/2 tab once daily for 1 week, then increase to a full tab once daily on week two    Dispense:  30 tablet    Refill:  0    Order Specific Question:   Supervising Provider    Answer:   A [[id number]]    I,Zite Okoli,acting as a scribe for Danise Edge,  NP.,have documented all relevant documentation on the behalf of Danise Edge, NP,as directed by  Lemont Fillers, NP while in the presence of Lemont Fillers, NP.   I, Lemont Fillers, NP , personally preformed the services described in this documentation.  All medical record entries made by the scribe were at my direction and in my presence.  I have reviewed the chart and discharge instructions (if applicable) and agree that the record reflects my personal performance and is accurate and complete. 06/27/2021

## 2021-06-30 ENCOUNTER — Ambulatory Visit: Payer: BC Managed Care – PPO | Admitting: Family

## 2021-07-19 ENCOUNTER — Other Ambulatory Visit: Payer: Self-pay | Admitting: Family

## 2021-08-01 ENCOUNTER — Other Ambulatory Visit (HOSPITAL_COMMUNITY)
Admission: RE | Admit: 2021-08-01 | Discharge: 2021-08-01 | Disposition: A | Discharge status: Home / Self Care | Payer: BC Managed Care – PPO | Source: Ambulatory Visit | Attending: Family | Admitting: Family

## 2021-08-01 ENCOUNTER — Ambulatory Visit (INDEPENDENT_AMBULATORY_CARE_PROVIDER_SITE_OTHER): Payer: BC Managed Care – PPO | Admitting: Family

## 2021-08-01 ENCOUNTER — Encounter: Payer: Self-pay | Admitting: Family

## 2021-08-01 VITALS — BP 122/66 | HR 74 | Temp 98.3°F | Resp 16 | Ht 67.0 in | Wt 234.0 lb

## 2021-08-01 DIAGNOSIS — Z01419 Encounter for gynecological examination (general) (routine) without abnormal findings: Secondary | ICD-10-CM | POA: Insufficient documentation

## 2021-08-01 DIAGNOSIS — M545 Low back pain, unspecified: Secondary | ICD-10-CM | POA: Diagnosis not present

## 2021-08-01 DIAGNOSIS — G43009 Migraine without aura, not intractable, without status migrainosus: Secondary | ICD-10-CM

## 2021-08-01 DIAGNOSIS — E785 Hyperlipidemia, unspecified: Secondary | ICD-10-CM

## 2021-08-01 DIAGNOSIS — Z0000 Encounter for general adult medical examination without abnormal findings: Secondary | ICD-10-CM | POA: Diagnosis not present

## 2021-08-01 DIAGNOSIS — F419 Anxiety disorder, unspecified: Secondary | ICD-10-CM | POA: Diagnosis not present

## 2021-08-01 DIAGNOSIS — G8929 Other chronic pain: Secondary | ICD-10-CM

## 2021-08-01 DIAGNOSIS — F32A Depression, unspecified: Secondary | ICD-10-CM

## 2021-08-01 LAB — COMPREHENSIVE METABOLIC PANEL
ALT: 17 U/L (ref 0–35)
AST: 10 U/L (ref 0–37)
Albumin: 4.3 g/dL (ref 3.5–5.2)
Alkaline Phosphatase: 50 U/L (ref 39–117)
BUN: 11 mg/dL (ref 6–23)
CO2: 26 mEq/L (ref 19–32)
Calcium: 9.3 mg/dL (ref 8.4–10.5)
Chloride: 104 mEq/L (ref 96–112)
Creatinine, Ser: 0.55 mg/dL (ref 0.40–1.20)
GFR: 126.57 mL/min (ref >60.00)
Glucose, Bld: 81 mg/dL (ref 70–99)
Potassium: 4 mEq/L (ref 3.5–5.1)
Sodium: 138 mEq/L (ref 135–145)
Total Bilirubin: 0.4 mg/dL (ref 0.2–1.2)
Total Protein: 6.4 g/dL (ref 6.0–8.3)

## 2021-08-01 LAB — LIPID PANEL
Cholesterol: 194 mg/dL (ref 0–200)
HDL: 81.6 mg/dL (ref >39.00)
LDL Cholesterol: 102 mg/dL — ABNORMAL HIGH (ref 0–99)
NonHDL: 112.61
Total CHOL/HDL Ratio: 2
Triglycerides: 54 mg/dL (ref 0.0–149.0)
VLDL: 10.8 mg/dL (ref 0.0–40.0)

## 2021-08-01 NOTE — Assessment & Plan Note (Signed)
Fair control with imitrex prn. She will work on eating regularly and let me know if migraines persist.

## 2021-08-03 LAB — CYTOLOGY - PAP: Diagnosis: NEGATIVE

## 2021-11-01 ENCOUNTER — Ambulatory Visit: Payer: BC Managed Care – PPO | Admitting: Family

## 2021-11-01 VITALS — BP 117/67 | HR 80 | Temp 98.1°F | Resp 16 | Wt 223.0 lb

## 2021-11-01 DIAGNOSIS — F32A Depression, unspecified: Secondary | ICD-10-CM | POA: Diagnosis not present

## 2021-11-01 DIAGNOSIS — Z23 Encounter for immunization: Secondary | ICD-10-CM

## 2021-11-01 DIAGNOSIS — F419 Anxiety disorder, unspecified: Secondary | ICD-10-CM

## 2021-11-01 MED ORDER — ESCITALOPRAM OXALATE 20 MG PO TABS: 20.0000 mg | ORAL_TABLET | Freq: Every day | ORAL | 1 refills | Status: DC

## 2021-11-01 NOTE — Assessment & Plan Note (Signed)
Uncontrolled. Recommended that she increase her lexapro from 10mg  to 20mg  and establish with a therapist. I gave her the contact information for [place] and the [facility]. We discussed if she develops SI with plan she is to go to the ED/call 911.  Pt verbalizes understanding and agrees to plan.

## 2021-11-01 NOTE — Progress Notes (Signed)
Subjective:   By signing my name below, I, Kylie Ware, attest that this documentation has been prepared under the direction and in the presence of [place], NP 11/01/2021   Patient ID: Kylie Ware, female    DOB: 1995-07-27, 26 y.o.   MRN: [id number]  Chief Complaint  Patient presents with   Anxiety    Here for follow up    HPI Patient is in today for an office visit  Depression/Anxiety: She states that there are changes in management at work which is causing her stress. Her boss informed her that she has been behaving abnormally, however, she does not believe so. Boss did not give her any examples of what they felt was odd behavior. She notes that she has been slightly sleep deprived due to the change of schedule. During the weekend, she oversleeps and gets more sleep than usual for her. She report suicidal thoughts that consist of "It would just be better if I wasn't here,"  but she does not have a plan in place. She does not have a counselor at this moment. She previously reported that the Lexapro medication caused her tiredness but side effects from the medication are resolved.   Immunizations: She is interested in receiving in receiving an influenza vaccine during today's visit. She reports that she has received the HPV vaccine as an adolescent.   Health Maintenance Due  Topic Date Due   COVID-19 Vaccine (1) Never done   HIV Screening  Never done   Hepatitis C Screening  Never done    Past Medical History:  Diagnosis Date   Anxiety    Back pain    pt states d/t "slipped disc"   Depression    Migraines     Past Surgical History:  Procedure Laterality Date   ORIF ANKLE FRACTURE Left 05/15/2019   Procedure: OPEN REDUCTION INTERNAL FIXATION (ORIF) LEFT BIMALLEOLAR ANKLE AND SYNDESMOSIS;  Surgeon: Leandrew Koyanagi, MD;  Location: [facility];  Service: Orthopedics;  Laterality: Left;   WISDOM TOOTH EXTRACTION      Family History  Problem  Relation Age of Onset   Cancer Mother 65       breast   Hypertension Mother    Diabetes Father 85       type 19   Aortic stenosis Father 95       had AVR in his 10's   Depression Father    Osteogenesis imperfecta Brother    Depression Brother    Cancer Maternal Grandmother 16       breast CA   Glaucoma Maternal Grandfather    Heart disease Maternal Grandfather        cad   Cancer Maternal Grandfather        unsure type   Alcoholism Paternal Grandmother    Emphysema Paternal Grandmother    CVA Paternal Grandfather    Dementia Paternal Grandfather     Social History   Socioeconomic History   Marital status: Single    Spouse name: Not on file   Number of children: Not on file   Years of education: Not on file   Highest education level: Not on file  Occupational History   Not on file  Tobacco Use   Smoking status: Never   Smokeless tobacco: Never  Vaping Use   Vaping Use: Never used  Substance and Sexual Activity   Alcohol use: No    Alcohol/week: 0.0 standard drinks of alcohol   Drug  use: No   Sexual activity: Not Currently  Other Topics Concern   Not on file  Social History Narrative   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   Completed a bachelor's degree   Enjoys reading, video games, walks [place] the [place]   No pets   Social Determinants of Health   Financial Resource Strain: Not on file  Food Insecurity: Not on file  Transportation Needs: Not on file  Physical Activity: Sufficiently Active (06/27/2021)   Exercise Vital Sign    Days of Exercise per Week: 3 days    Minutes of Exercise per Session: 60 min  Stress: Not on file  Social Connections: Not on file  Intimate Partner Violence: Not on file    Outpatient Medications Prior to Visit  Medication Sig Dispense Refill   ibuprofen (ADVIL) 200 MG tablet Take 200 mg by mouth every 6 (six) hours as needed.     SUMAtriptan (IMITREX) 50 MG tablet 1 tablet by mouth at start of migraine, may  repeat in 2 hours if needed (Max 2 tabs/24 hrs) 10 tablet 5   escitalopram (LEXAPRO) 10 MG tablet Take 1 tablet (10 mg total) by mouth daily. 90 tablet 1   No facility-administered medications prior to visit.    No Known Allergies  ROS     Objective:    Physical Exam Constitutional:      General: She is not in acute distress.    Appearance: Normal appearance. She is not ill-appearing.  HENT:     Head: Normocephalic and atraumatic.     Right Ear: External ear normal.     Left Ear: External ear normal.  Eyes:     Extraocular Movements: Extraocular movements intact.     Pupils: Pupils are equal, round, and reactive to light.  Cardiovascular:     Rate and Rhythm: Normal rate.  Pulmonary:     Effort: Pulmonary effort is normal.  Skin:    General: Skin is warm and dry.  Neurological:     Mental Status: She is alert and oriented to person, place, and time.  Psychiatric:        Mood and Affect: Mood normal. Affect is flat.        Behavior: Behavior normal.        Judgment: Judgment normal.     BP 117/67 (BP Location: Right Arm, Patient Position: Sitting, Cuff Size: Large)   Pulse 80   Temp 98.1 F (36.7 C) (Oral)   Resp 16   Wt 223 lb (101.2 kg)   SpO2 100%   BMI 34.93 kg/m  Wt Readings from Last 3 Encounters:  11/01/21 223 lb (101.2 kg)  08/01/21 234 lb (106.1 kg)  06/27/21 245 lb (111.1 kg)       Assessment & Plan:   Problem List Items Addressed This Visit       Unprioritized   Anxiety and depression    Uncontrolled. Recommended that she increase her lexapro from 10mg  to 20mg  and establish with a therapist. I gave her the contact information for [facility] and the [facility]. We discussed if she develops SI with plan she is to go to the ED/call 911.  Pt verbalizes understanding and agrees to plan.       Relevant Medications   escitalopram (LEXAPRO) 20 MG tablet   Other Visit Diagnoses     Needs flu shot    -  Primary    Relevant Orders  Flu Vaccine QUAD 6+ mos PF IM (Fluarix Quad PF) (Completed)      Meds ordered this encounter  Medications   escitalopram (LEXAPRO) 20 MG tablet    Sig: Take 1 tablet (20 mg total) by mouth daily.    Dispense:  30 tablet    Refill:  1    This replaces rx for 10mg  please cancel    Order Specific Question:   Supervising Provider    Answer:   Penni Homans A [[id number]]    I, Nance Pear, NP, personally preformed the services described in this documentation.  All medical record entries made by the scribe were at my direction and in my presence.  I have reviewed the chart and discharge instructions (if applicable) and agree that the record reflects my personal performance and is accurate and complete. 11/01/2021   I,Amber Collins,acting as a scribe for Nance Pear, NP.,have documented all relevant documentation on the behalf of Nance Pear, NP,as directed by  Nance Pear, NP while in the presence of Nance Pear, NP.    Nance Pear, NP

## 2021-11-23 ENCOUNTER — Other Ambulatory Visit: Payer: Self-pay | Admitting: Family

## 2021-11-23 DIAGNOSIS — F419 Anxiety disorder, unspecified: Secondary | ICD-10-CM

## 2021-11-29 DIAGNOSIS — R109 Unspecified abdominal pain: Secondary | ICD-10-CM | POA: Diagnosis not present

## 2021-11-29 DIAGNOSIS — R5383 Other fatigue: Secondary | ICD-10-CM | POA: Diagnosis not present

## 2021-11-29 DIAGNOSIS — R42 Dizziness and giddiness: Secondary | ICD-10-CM | POA: Diagnosis not present

## 2021-12-02 ENCOUNTER — Ambulatory Visit: Payer: BC Managed Care – PPO | Admitting: Family

## 2021-12-02 VITALS — BP 121/74 | HR 74 | Temp 98.0°F | Resp 16 | Wt 222.0 lb

## 2021-12-02 DIAGNOSIS — R109 Unspecified abdominal pain: Secondary | ICD-10-CM

## 2021-12-02 DIAGNOSIS — F419 Anxiety disorder, unspecified: Secondary | ICD-10-CM | POA: Diagnosis not present

## 2021-12-02 DIAGNOSIS — F32A Depression, unspecified: Secondary | ICD-10-CM

## 2021-12-02 DIAGNOSIS — G43009 Migraine without aura, not intractable, without status migrainosus: Secondary | ICD-10-CM

## 2021-12-02 DIAGNOSIS — H8113 Benign paroxysmal vertigo, bilateral: Secondary | ICD-10-CM | POA: Diagnosis not present

## 2021-12-02 NOTE — Assessment & Plan Note (Signed)
She reports no significant improvement with meclizine from urgent care.  Offered referral for Vestibular Rehab.  She will think about it and let me know if she wishes to proceed at a later date.

## 2021-12-02 NOTE — Assessment & Plan Note (Signed)
Uncontrolled, no improvement with imitrex.  Declines toradol injection today in the office. Will refer her to neurology for further management.

## 2021-12-02 NOTE — Assessment & Plan Note (Signed)
New. Reports positive constipation. Suggested that she add miralax one capful once daily as needed and let us know if her symptoms do not improve.

## 2021-12-02 NOTE — Assessment & Plan Note (Addendum)
Improvement in her mood on lexapro.  I don't think this is the cause for her pain in her left abdomen or muscle fatigue.

## 2021-12-02 NOTE — Progress Notes (Signed)
Subjective:   By signing my name below, I, Kylie Ware, attest that this documentation has been prepared under the direction and in the presence of Karie Chimera, NP 12/02/2021    Patient ID: Kylie Ware, female    DOB: 16-Sep-1995, 26 y.o.   MRN: [id number]  Chief Complaint  Patient presents with   Anxiety    Here for follow up    HPI Patient is in today for an office visit  Anxiety: Since the last visit, her Lexapro was increased from 10 Mg of 20 Mg of Lexapro. She reports that since starting the medication, her anxiety has decreased but her muscle fatigue  on her left side is increasing. She states the area is mildly tender. She also states that it has been harder to move her bowels as of recently. She is also experiencing dizziness. Symptoms worsen when she rotates her head. She went to the urgent care for her dizziness symptoms and reports that she was given vertigo medication for symptoms. She states that the medication does not alleviate her symptoms. She is also experiencing daily migraines. She reports that the 50 Mg of Imitrex does not alleviate her symptoms. She used to go to a neurologist back in college but reports that she no longer goes. She is not interested in receiving an injection for her migraine symptoms at this moment.  Swollen Lymph Nodes: She reports that her lymph nodes are currently swollen.    Health Maintenance Due  Topic Date Due   COVID-19 Vaccine (1) Never done   HIV Screening  Never done   Hepatitis C Screening  Never done    Past Medical History:  Diagnosis Date   Anxiety    Back pain    pt states d/t "slipped disc"   Depression    Migraines     Past Surgical History:  Procedure Laterality Date   ORIF ANKLE FRACTURE Left 05/15/2019   Procedure: OPEN REDUCTION INTERNAL FIXATION (ORIF) LEFT BIMALLEOLAR ANKLE AND SYNDESMOSIS;  Surgeon: Leandrew Koyanagi, MD;  Location: [facility];  Service: Orthopedics;  Laterality: Left;    WISDOM TOOTH EXTRACTION      Family History  Problem Relation Age of Onset   Cancer Mother 67       breast   Hypertension Mother    Diabetes Father 37       type 32   Aortic stenosis Father 14       had AVR in his 10's   Depression Father    Osteogenesis imperfecta Brother    Depression Brother    Cancer Maternal Grandmother 7       breast CA   Glaucoma Maternal Grandfather    Heart disease Maternal Grandfather        cad   Cancer Maternal Grandfather        unsure type   Alcoholism Paternal Grandmother    Emphysema Paternal Grandmother    CVA Paternal Grandfather    Dementia Paternal Grandfather     Social History   Socioeconomic History   Marital status: Single    Spouse name: Not on file   Number of children: Not on file   Years of education: Not on file   Highest education level: Not on file  Occupational History   Not on file  Tobacco Use   Smoking status: Never   Smokeless tobacco: Never  Vaping Use   Vaping Use: Never used  Substance and Sexual Activity   Alcohol  use: No    Alcohol/week: 0.0 standard drinks of alcohol   Drug use: No   Sexual activity: Not Currently  Other Topics Concern   Not on file  Social History Narrative   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   Completed a bachelor's degree   Enjoys reading, video games, walks on the [place]   No pets   Social Determinants of Health   Financial Resource Strain: Not on file  Food Insecurity: Not on file  Transportation Needs: Not on file  Physical Activity: Sufficiently Active (06/27/2021)   Exercise Vital Sign    Days of Exercise per Week: 3 days    Minutes of Exercise per Session: 60 min  Stress: Not on file  Social Connections: Not on file  Intimate Partner Violence: Not on file    Outpatient Medications Prior to Visit  Medication Sig Dispense Refill   escitalopram (LEXAPRO) 20 MG tablet TAKE 1 TABLET BY MOUTH EVERY DAY 90 tablet 1   ibuprofen (ADVIL) 200 MG  tablet Take 200 mg by mouth every 6 (six) hours as needed.     SUMAtriptan (IMITREX) 50 MG tablet 1 tablet by mouth at start of migraine, may repeat in 2 hours if needed (Max 2 tabs/24 hrs) 10 tablet 5   No facility-administered medications prior to visit.    No Known Allergies  Review of Systems  HENT:         (+) Swollen Lymph Nodes  Musculoskeletal:  Positive for myalgias (Left Side).  Neurological:  Positive for dizziness.       (+) Migraine       Objective:    Physical Exam Constitutional:      General: She is not in acute distress.    Appearance: Normal appearance. She is not ill-appearing.  HENT:     Head: Normocephalic and atraumatic.     Right Ear: External ear normal.     Left Ear: External ear normal.  Eyes:     Extraocular Movements: Extraocular movements intact.     Pupils: Pupils are equal, round, and reactive to light.  Cardiovascular:     Rate and Rhythm: Normal rate and regular rhythm.     Heart sounds: Normal heart sounds. No murmur heard.    No gallop.  Pulmonary:     Effort: Pulmonary effort is normal. No respiratory distress.     Breath sounds: Normal breath sounds. No wheezing or rales.  Abdominal:     Comments: Mild left lateral abdominal tenderness without guarding  Normal bowel sounds present  Lymphadenopathy:     Cervical: No cervical adenopathy.  Skin:    General: Skin is warm and dry.  Neurological:     Mental Status: She is alert and oriented to person, place, and time.     Comments:  Dix-Hallpike Test:  (+) Right Side  (+) Left Side (Mild)   Psychiatric:        Mood and Affect: Mood normal.        Behavior: Behavior normal.        Judgment: Judgment normal.     BP 121/74 (BP Location: Right Arm, Patient Position: Sitting, Cuff Size: Large)   Pulse 74   Temp 98 F (36.7 C) (Oral)   Resp 16   Wt 222 lb (100.7 kg)   SpO2 100%   BMI 34.77 kg/m  Wt Readings from Last 3 Encounters:  12/02/21 222 lb (100.7 kg)  11/01/21 223 lb  (101.2  kg)  08/01/21 234 lb (106.1 kg)       Assessment & Plan:   Problem List Items Addressed This Visit       Unprioritized   Migraine without aura and without status migrainosus, not intractable - Primary    Uncontrolled, no improvement with imitrex.  Declines toradol injection today in the office. Will refer her to neurology for further management.       Relevant Orders   Ambulatory referral to Neurology   Benign paroxysmal positional vertigo due to bilateral vestibular disorder    She reports no significant improvement with meclizine from urgent care.  Offered referral for Vestibular Rehab.  She will think about it and let me know if she wishes to proceed at a later date.       Anxiety and depression    Improvement in her mood on lexapro.  I don't think this is the cause for her pain in her left abdomen or muscle fatigue.       Abdominal pain    New. Reports positive constipation. Suggested that she add miralax one capful once daily as needed and let us know if her symptoms do not improve.       No orders of the defined types were placed in this encounter.   I, Lemont Fillers, NP, personally preformed the services described in this documentation.  All medical record entries made by the scribe were at my direction and in my presence.  I have reviewed the chart and discharge instructions (if applicable) and agree that the record reflects my personal performance and is accurate and complete. 12/02/2021   I,Amber Collins,acting as a scribe for Lemont Fillers, NP.,have documented all relevant documentation on the behalf of Lemont Fillers, NP,as directed by  Lemont Fillers, NP while in the presence of Lemont Fillers, NP.    Lemont Fillers, NP

## 2022-01-13 ENCOUNTER — Encounter: Payer: Self-pay | Admitting: Family

## 2022-01-13 ENCOUNTER — Ambulatory Visit: Payer: BC Managed Care – PPO | Admitting: Family

## 2022-01-13 VITALS — BP 121/69 | HR 79 | Temp 98.2°F | Resp 18 | Ht 67.0 in | Wt 223.6 lb

## 2022-01-13 DIAGNOSIS — M545 Low back pain, unspecified: Secondary | ICD-10-CM

## 2022-01-13 DIAGNOSIS — G43009 Migraine without aura, not intractable, without status migrainosus: Secondary | ICD-10-CM | POA: Diagnosis not present

## 2022-01-13 DIAGNOSIS — F32A Depression, unspecified: Secondary | ICD-10-CM

## 2022-01-13 DIAGNOSIS — F419 Anxiety disorder, unspecified: Secondary | ICD-10-CM

## 2022-01-13 DIAGNOSIS — G8929 Other chronic pain: Secondary | ICD-10-CM

## 2022-01-13 DIAGNOSIS — H8113 Benign paroxysmal vertigo, bilateral: Secondary | ICD-10-CM

## 2022-01-13 NOTE — Progress Notes (Signed)
Subjective:   By signing my name below, I, [name], attest that this documentation has been prepared under the direction and in the presence of [name] [place], NP 01/13/2022    Patient ID: Kylie Ware, female    DOB: 01-04-1996, 26 y.o.   MRN: [id number]  Chief Complaint  Patient presents with   6 week follow up    Concerns/ questions: none    HPI Patient is in today for an office visit  Migraines: She states that she was not contacted after being referred for neurology. Since last visit, her migraines are not too bad however she feels as though they are going to increase in frequency due to the upcoming of a stressful season. She has her 50 mg of Imitrex at hand.  Mood: She states that her mood is "so-so". She is currently taking 20 mg of Lexapro. She is not seeing a counselor at the moment but states that her roommate is recommending her therapist.   Vertigo: She denies of any recent vertigo symptoms.   Back Pain: She reports that her back pain is not bad.   Immunizations: She is not UTD on the Covid vaccine.   Health Maintenance Due  Topic Date Due   COVID-19 Vaccine (1) Never done   HIV Screening  Never done   Hepatitis C Screening  Never done    Past Medical History:  Diagnosis Date   Anxiety    Back pain    pt states d/t "slipped disc"   Depression    Displaced bimalleolar fracture of left lower leg, initial encounter for closed fracture 05/15/2019   Displaced bimalleolar fracture of left lower leg, subsequent encounter for closed fracture with delayed healing 09/04/2019   Migraines     Past Surgical History:  Procedure Laterality Date   ORIF ANKLE FRACTURE Left 05/15/2019   Procedure: OPEN REDUCTION INTERNAL FIXATION (ORIF) LEFT BIMALLEOLAR ANKLE AND SYNDESMOSIS;  Surgeon: Tarry Kos, MD;  Location: [facility];  Service: Orthopedics;  Laterality: Left;   WISDOM TOOTH EXTRACTION      Family History  Problem Relation Age of  Onset   Cancer Mother 59       breast   Hypertension Mother    Diabetes Father 6       type 28   Aortic stenosis Father 62       had AVR in his 45's   Depression Father    Osteogenesis imperfecta Brother    Depression Brother    Cancer Maternal Grandmother 22       breast CA   Glaucoma Maternal Grandfather    Heart disease Maternal Grandfather        cad   Cancer Maternal Grandfather        unsure type   Alcoholism Paternal Grandmother    Emphysema Paternal Grandmother    CVA Paternal Grandfather    Dementia Paternal Grandfather     Social History   Socioeconomic History   Marital status: Single    Spouse name: Not on file   Number of children: Not on file   Years of education: Not on file   Highest education level: Not on file  Occupational History   Not on file  Tobacco Use   Smoking status: Never   Smokeless tobacco: Never  Vaping Use   Vaping Use: Never used  Substance and Sexual Activity   Alcohol use: No    Alcohol/week: 0.0 standard drinks of alcohol  Drug use: No   Sexual activity: Not Currently  Other Topics Concern   Not on file  Social History Narrative   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   Completed a bachelor's degree   Enjoys reading, video games, walks [place] the [place]   No pets   Social Determinants of Health   Financial Resource Strain: Not on file  Food Insecurity: Not on file  Transportation Needs: Not on file  Physical Activity: Sufficiently Active (06/27/2021)   Exercise Vital Sign    Days of Exercise per Week: 3 days    Minutes of Exercise per Session: 60 min  Stress: Not on file  Social Connections: Not on file  Intimate Partner Violence: Not on file    Outpatient Medications Prior to Visit  Medication Sig Dispense Refill   escitalopram (LEXAPRO) 20 MG tablet TAKE 1 TABLET BY MOUTH EVERY DAY 90 tablet 1   ibuprofen (ADVIL) 200 MG tablet Take 200 mg by mouth every 6 (six) hours as needed.     SUMAtriptan  (IMITREX) 50 MG tablet 1 tablet by mouth at start of migraine, may repeat in 2 hours if needed (Max 2 tabs/24 hrs) 10 tablet 5   No facility-administered medications prior to visit.    No Known Allergies  ROS    See HPI Objective:    Physical Exam Constitutional:      General: She is not in acute distress.    Appearance: Normal appearance. She is not ill-appearing.  HENT:     Head: Normocephalic and atraumatic.     Right Ear: External ear normal.     Left Ear: External ear normal.  Eyes:     Extraocular Movements: Extraocular movements intact.     Pupils: Pupils are equal, round, and reactive to light.  Cardiovascular:     Rate and Rhythm: Normal rate and regular rhythm.     Heart sounds: Normal heart sounds. No murmur heard.    No gallop.  Pulmonary:     Effort: Pulmonary effort is normal. No respiratory distress.     Breath sounds: Normal breath sounds. No wheezing or rales.  Skin:    General: Skin is warm and dry.  Neurological:     Mental Status: She is alert and oriented to person, place, and time.  Psychiatric:        Mood and Affect: Mood normal.        Behavior: Behavior normal.        Judgment: Judgment normal.     BP 121/69 (BP Location: Left Arm, Patient Position: Sitting, Cuff Size: Large)   Pulse 79   Temp 98.2 F (36.8 C) (Oral)   Resp 18   Ht 5\' 7"  (1.702 m)   Wt 223 lb 9.6 oz (101.4 kg)   SpO2 97%   BMI 35.02 kg/m  Wt Readings from Last 3 Encounters:  01/13/22 223 lb 9.6 oz (101.4 kg)  12/02/21 222 lb (100.7 kg)  11/01/21 223 lb (101.2 kg)       Assessment & Plan:   Problem List Items Addressed This Visit       Unprioritized   Migraine without aura and without status migrainosus, not intractable - Primary    Currently stable. [facility] neuro declined referral as she has seen [facility] neuro in the past, will refer back to [facility] neuro. Has imitrex on hand for prn use.       Relevant Orders   Ambulatory referral to Neurology  Chronic midline low back pain without sciatica    Stable, monitor.       Benign paroxysmal positional vertigo due to bilateral vestibular disorder    Stable, no recent symptoms. Monitor.       Anxiety and depression    Fair control. Encouraged pt to establish with a therapist. Continue lexapro 20mg .       No orders of the defined types were placed in this encounter.   I, , NP, personally preformed the services described in this documentation.  All medical record entries made by the scribe were at my direction and in my presence.  I have reviewed the chart and discharge instructions (if applicable) and agree that the record reflects my personal performance and is accurate and complete. 01/13/2022   I,Amber Collins,acting as a scribe for 14/09/2021, NP.,have documented all relevant documentation on the behalf of Lemont Fillers, NP,as directed by  Lemont Fillers, NP while in the presence of Lemont Fillers, NP.    Lemont Fillers, NP

## 2022-01-13 NOTE — Assessment & Plan Note (Signed)
Currently stable. [facility] neuro declined referral as she has seen [facility] neuro in the past, will refer back to [facility] neuro. Has imitrex on hand for prn use.

## 2022-01-13 NOTE — Assessment & Plan Note (Signed)
Stable, monitor.  °

## 2022-01-13 NOTE — Assessment & Plan Note (Signed)
Stable, no recent symptoms. Monitor.

## 2022-01-13 NOTE — Assessment & Plan Note (Signed)
Fair control. Encouraged pt to establish with a therapist. Continue lexapro 20mg .

## 2022-01-28 DIAGNOSIS — F4322 Adjustment disorder with anxiety: Secondary | ICD-10-CM | POA: Diagnosis not present

## 2022-02-04 DIAGNOSIS — F4322 Adjustment disorder with anxiety: Secondary | ICD-10-CM | POA: Diagnosis not present

## 2022-02-09 DIAGNOSIS — F4322 Adjustment disorder with anxiety: Secondary | ICD-10-CM | POA: Diagnosis not present

## 2022-02-18 DIAGNOSIS — F4322 Adjustment disorder with anxiety: Secondary | ICD-10-CM | POA: Diagnosis not present

## 2022-02-27 ENCOUNTER — Encounter: Payer: Self-pay | Admitting: Diagnostic Neuroimaging

## 2022-02-27 ENCOUNTER — Ambulatory Visit: Payer: BC Managed Care – PPO | Admitting: Diagnostic Neuroimaging

## 2022-03-04 DIAGNOSIS — F4322 Adjustment disorder with anxiety: Secondary | ICD-10-CM | POA: Diagnosis not present

## 2022-03-10 DIAGNOSIS — F4322 Adjustment disorder with anxiety: Secondary | ICD-10-CM | POA: Diagnosis not present

## 2022-03-18 DIAGNOSIS — F4322 Adjustment disorder with anxiety: Secondary | ICD-10-CM | POA: Diagnosis not present

## 2022-03-20 ENCOUNTER — Ambulatory Visit: Payer: BC Managed Care – PPO | Admitting: Diagnostic Neuroimaging

## 2022-03-24 DIAGNOSIS — F4322 Adjustment disorder with anxiety: Secondary | ICD-10-CM | POA: Diagnosis not present

## 2022-04-06 DIAGNOSIS — F4322 Adjustment disorder with anxiety: Secondary | ICD-10-CM | POA: Diagnosis not present

## 2022-04-29 DIAGNOSIS — F4322 Adjustment disorder with anxiety: Secondary | ICD-10-CM | POA: Diagnosis not present

## 2022-05-13 DIAGNOSIS — F4322 Adjustment disorder with anxiety: Secondary | ICD-10-CM | POA: Diagnosis not present

## 2022-05-23 DIAGNOSIS — R509 Fever, unspecified: Secondary | ICD-10-CM | POA: Diagnosis not present

## 2022-05-23 DIAGNOSIS — J029 Acute pharyngitis, unspecified: Secondary | ICD-10-CM | POA: Diagnosis not present

## 2022-05-24 ENCOUNTER — Other Ambulatory Visit: Payer: Self-pay | Admitting: Family

## 2022-05-24 DIAGNOSIS — F419 Anxiety disorder, unspecified: Secondary | ICD-10-CM

## 2022-05-26 DIAGNOSIS — H6692 Otitis media, unspecified, left ear: Secondary | ICD-10-CM | POA: Diagnosis not present

## 2022-05-27 DIAGNOSIS — F4322 Adjustment disorder with anxiety: Secondary | ICD-10-CM | POA: Diagnosis not present

## 2022-05-29 NOTE — Progress Notes (Signed)
Subjective:   By signing my name below, I, Kylie Ware, attest that this documentation has been prepared under the direction and in the presence of Sandford Craze, NP.  05/30/2022.   Patient ID: Kylie Ware, female    DOB: July 12, 1995, 27 y.o.   MRN: [id number]  No chief complaint on file.   HPI Patient is in today for an office visit.  Had/have left ear infection, hasn't improved despite antibiotics for 4 days.    Past Medical History:  Diagnosis Date   Anxiety    Back pain    pt states d/t "slipped disc"   Depression    Displaced bimalleolar fracture of left lower leg, initial encounter for closed fracture 05/15/2019   Displaced bimalleolar fracture of left lower leg, subsequent encounter for closed fracture with delayed healing 09/04/2019   Migraines     Past Surgical History:  Procedure Laterality Date   ORIF ANKLE FRACTURE Left 05/15/2019   Procedure: OPEN REDUCTION INTERNAL FIXATION (ORIF) LEFT BIMALLEOLAR ANKLE AND SYNDESMOSIS;  Surgeon: Tarry Kos, MD;  Location: [facility];  Service: Orthopedics;  Laterality: Left;   WISDOM TOOTH EXTRACTION      Family History  Problem Relation Age of Onset   Cancer Mother 86       breast   Hypertension Mother    Diabetes Father 7       type 27   Aortic stenosis Father 13       had AVR in his 10's   Depression Father    Osteogenesis imperfecta Brother    Depression Brother    Cancer Maternal Grandmother 31       breast CA   Glaucoma Maternal Grandfather    Heart disease Maternal Grandfather        cad   Cancer Maternal Grandfather        unsure type   Alcoholism Paternal Grandmother    Emphysema Paternal Grandmother    CVA Paternal Grandfather    Dementia Paternal Grandfather     Social History   Socioeconomic History   Marital status: Single    Spouse name: Not on file   Number of children: Not on file   Years of education: Not on file   Highest education level: Bachelor's  degree (e.g., BA, AB, BS)  Occupational History   Not on file  Tobacco Use   Smoking status: Never   Smokeless tobacco: Never  Vaping Use   Vaping Use: Never used  Substance and Sexual Activity   Alcohol use: No    Alcohol/week: 0.0 standard drinks of alcohol   Drug use: No   Sexual activity: Not Currently  Other Topics Concern   Not on file  Social History Narrative   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   Completed a bachelor's degree   Enjoys reading, video games, walks on the green[place]   No pets   Social Determinants of Health   Financial Resource Strain: Medium Risk (05/29/2022)   Overall Financial Resource Strain (CARDIA)    Difficulty of Paying Living Expenses: Somewhat hard  Food Insecurity: Food Insecurity Present (05/29/2022)   Hunger Vital Sign    Worried About Running Out of Food in the Last Year: Sometimes true    Ran Out of Food in the Last Year: Never true  Transportation Needs: No Transportation Needs (05/29/2022)   PRAPARE - Transportation    Lack of Transportation (Medical): No    Lack of  Transportation (Non-Medical): No  Physical Activity: Insufficiently Active (05/29/2022)   Exercise Vital Sign    Days of Exercise per Week: 4 days    Minutes of Exercise per Session: 20 min  Stress: Stress Concern Present (05/29/2022)   [employer] of Occupational Health - Occupational Stress Questionnaire    Feeling of Stress : Rather much  Social Connections: Moderately Isolated (05/29/2022)   Social Connection and Isolation Panel [NHANES]    Frequency of Communication with Friends and Family: Once a week    Frequency of Social Gatherings with Friends and Family: Once a week    Attends Religious Services: More than 4 times per year    Active Member of [employer] or Organizations: Yes    Attends Engineer, structural: More than 4 times per year    Marital Status: Never married  Catering manager Violence: Not on file    Outpatient Medications  Prior to Visit  Medication Sig Dispense Refill   escitalopram (LEXAPRO) 20 MG tablet TAKE 1 TABLET BY MOUTH EVERY DAY 90 tablet 1   ibuprofen (ADVIL) 200 MG tablet Take 200 mg by mouth every 6 (six) hours as needed.     SUMAtriptan (IMITREX) 50 MG tablet 1 tablet by mouth at start of migraine, may repeat in 2 hours if needed (Max 2 tabs/24 hrs) 10 tablet 5   No facility-administered medications prior to visit.    No Known Allergies  ROS  See HPI.     Objective:    Physical Exam Constitutional:      Appearance: Normal appearance.  HENT:     Head: Normocephalic and atraumatic.     Right Ear: Tympanic membrane, ear canal and external ear normal.     Left Ear: Tympanic membrane, ear canal and external ear normal.  Eyes:     Extraocular Movements: Extraocular movements intact.     Pupils: Pupils are equal, round, and reactive to light.  Cardiovascular:     Rate and Rhythm: Normal rate and regular rhythm.     Heart sounds: Normal heart sounds. No murmur heard.    No gallop.  Pulmonary:     Effort: Pulmonary effort is normal. No respiratory distress.     Breath sounds: Normal breath sounds. No wheezing or rales.  Skin:    General: Skin is warm and dry.  Neurological:     General: No focal deficit present.     Mental Status: She is alert and oriented to person, place, and time.  Psychiatric:        Mood and Affect: Mood normal.        Behavior: Behavior normal.     There were no vitals taken for this visit. Wt Readings from Last 3 Encounters:  01/13/22 223 lb 9.6 oz (101.4 kg)  12/02/21 222 lb (100.7 kg)  11/01/21 223 lb (101.2 kg)    Diabetic Foot Exam - Simple   No data filed    Lab Results  Component Value Date   WBC 8.8 05/09/2016   HGB 12.4 05/09/2016   HCT 38.0 05/09/2016   PLT 370.0 05/09/2016   GLUCOSE 81 08/01/2021   CHOL 194 08/01/2021   TRIG 54.0 08/01/2021   HDL 81.60 08/01/2021   LDLCALC 102 (H) 08/01/2021   ALT 17 08/01/2021   AST 10 08/01/2021    NA 138 08/01/2021   K 4.0 08/01/2021   CL 104 08/01/2021   CREATININE 0.55 08/01/2021   BUN 11 08/01/2021   CO2 26 08/01/2021  TSH 3.84 03/29/2015   HGBA1C 5.4 08/02/2012    Lab Results  Component Value Date   TSH 3.84 03/29/2015   Lab Results  Component Value Date   WBC 8.8 05/09/2016   HGB 12.4 05/09/2016   HCT 38.0 05/09/2016   MCV 83.3 05/09/2016   PLT 370.0 05/09/2016   Lab Results  Component Value Date   NA 138 08/01/2021   K 4.0 08/01/2021   CO2 26 08/01/2021   GLUCOSE 81 08/01/2021   BUN 11 08/01/2021   CREATININE 0.55 08/01/2021   BILITOT 0.4 08/01/2021   ALKPHOS 50 08/01/2021   AST 10 08/01/2021   ALT 17 08/01/2021   PROT 6.4 08/01/2021   ALBUMIN 4.3 08/01/2021   CALCIUM 9.3 08/01/2021   GFR 126.57 08/01/2021   Lab Results  Component Value Date   CHOL 194 08/01/2021   Lab Results  Component Value Date   HDL 81.60 08/01/2021   Lab Results  Component Value Date   LDLCALC 102 (H) 08/01/2021   Lab Results  Component Value Date   TRIG 54.0 08/01/2021   Lab Results  Component Value Date   CHOLHDL 2 08/01/2021   Lab Results  Component Value Date   HGBA1C 5.4 08/02/2012       Assessment & Plan:   Problem List Items Addressed This Visit   None    No orders of the defined types were placed in this encounter.   Rollen Sox, personally preformed the services described in this documentation.  All medical record entries made by the scribe were at my direction and in my presence.  I have reviewed the chart and discharge instructions (if applicable) and agree that the record reflects my personal performance and is accurate and complete. 05/30/2022.  I,Mathew Stumpf,acting as a Neurosurgeon for [employer], NP.,have documented all relevant documentation on the behalf of Kylie Fillers, NP,as directed by  Kylie Fillers, NP while in the presence of Kylie Fillers, NP.   Kylie Ware

## 2022-05-30 ENCOUNTER — Ambulatory Visit: Payer: BC Managed Care – PPO | Admitting: Family

## 2022-05-30 VITALS — BP 125/74 | HR 78 | Temp 97.5°F | Resp 16 | Wt 233.0 lb

## 2022-05-30 DIAGNOSIS — H6502 Acute serous otitis media, left ear: Secondary | ICD-10-CM | POA: Diagnosis not present

## 2022-05-30 MED ORDER — CEFDINIR 300 MG PO CAPS: 300.0000 mg | ORAL_CAPSULE | Freq: Two times a day (BID) | ORAL | 0 refills | Status: DC

## 2022-05-30 NOTE — Assessment & Plan Note (Signed)
New.  I think she is partially treated with the amoxicillin. Will plan course of cefdinir. Discussed that the fluid in the ear can linger for several weeks after treatment.  She will let me know if symptoms worse or if symptoms do not improve.

## 2022-06-01 DIAGNOSIS — F4322 Adjustment disorder with anxiety: Secondary | ICD-10-CM | POA: Diagnosis not present

## 2022-06-06 ENCOUNTER — Ambulatory Visit: Payer: BC Managed Care – PPO | Admitting: Family

## 2022-06-06 VITALS — BP 107/76 | HR 106 | Temp 97.9°F | Resp 16 | Wt 229.0 lb

## 2022-06-06 DIAGNOSIS — A084 Viral intestinal infection, unspecified: Secondary | ICD-10-CM | POA: Diagnosis not present

## 2022-06-06 LAB — COMPREHENSIVE METABOLIC PANEL
ALT: 11 U/L (ref 0–35)
AST: 9 U/L (ref 0–37)
Albumin: 3.9 g/dL (ref 3.5–5.2)
Alkaline Phosphatase: 59 U/L (ref 39–117)
BUN: 8 mg/dL (ref 6–23)
CO2: 25 mEq/L (ref 19–32)
Calcium: 9.3 mg/dL (ref 8.4–10.5)
Chloride: 103 mEq/L (ref 96–112)
Creatinine, Ser: 0.59 mg/dL (ref 0.40–1.20)
GFR: 123.71 mL/min (ref >60.00)
Glucose, Bld: 84 mg/dL (ref 70–99)
Potassium: 3.8 mEq/L (ref 3.5–5.1)
Sodium: 138 mEq/L (ref 135–145)
Total Bilirubin: 0.3 mg/dL (ref 0.2–1.2)
Total Protein: 6.8 g/dL (ref 6.0–8.3)

## 2022-06-06 LAB — CBC WITH DIFFERENTIAL/PLATELET
Basophils Absolute: 0 10*3/uL (ref 0.0–0.1)
Basophils Relative: 0.3 % (ref 0.0–3.0)
Eosinophils Absolute: 0.1 10*3/uL (ref 0.0–0.7)
Eosinophils Relative: 1.1 % (ref 0.0–5.0)
HCT: 40.8 % (ref 36.0–46.0)
Hemoglobin: 13.2 g/dL (ref 12.0–15.0)
Lymphocytes Relative: 18.2 % (ref 12.0–46.0)
Lymphs Abs: 2.1 10*3/uL (ref 0.7–4.0)
MCHC: 32.3 g/dL (ref 30.0–36.0)
MCV: 85.8 fl (ref 78.0–100.0)
Monocytes Absolute: 1 10*3/uL (ref 0.1–1.0)
Monocytes Relative: 8.5 % (ref 3.0–12.0)
Neutro Abs: 8.2 10*3/uL — ABNORMAL HIGH (ref 1.4–7.7)
Neutrophils Relative %: 71.9 % (ref 43.0–77.0)
Platelets: 303 10*3/uL (ref 150.0–400.0)
RBC: 4.75 Mil/uL (ref 3.87–5.11)
RDW: 13.9 % (ref 11.5–15.5)
WBC: 11.4 10*3/uL — ABNORMAL HIGH (ref 4.0–10.5)

## 2022-06-06 MED ORDER — ONDANSETRON HCL 4 MG PO TABS: 4.0000 mg | ORAL_TABLET | Freq: Three times a day (TID) | ORAL | 0 refills | Status: DC | PRN

## 2022-06-06 NOTE — Assessment & Plan Note (Addendum)
New. Discussed trial of imodium for diarrhea. For nausea may use zofran PRN. She is advised to drink small frequent sips of water/gatorade to avoid dehydration. She is also advised to go to the ER if she develops increased weakness, worsening abdominal pain or if she becomes unable to keep down fluids. Will obtain labs as ordered.

## 2022-06-06 NOTE — Progress Notes (Addendum)
Subjective:   By signing my name below, I, [name], attest that this documentation has been prepared under the direction and in the presence of Kylie Craze, NP.  06/06/2022.   Patient ID: Kylie Ware, female    DOB: 03-Sep-1995, 27 y.o.   MRN: [id number]  Chief Complaint  Patient presents with   Diarrhea    Complains of having diarrhea since Sunday    HPI Patient is in today for an office visit.  Diarrhea:  This past Sunday morning she acutely developed abdominal pain and diarrhea.  Her symptoms have not waned or resolved. Her diarrhea occurs about every 20 minutes.  She tries to drink water, but notes this goes right through. Today she has only been able to stomach a pice and a half of bread so far. For treatment, she has not tried Imodium. Additionally she complains of central abdominal pain which she describes as "like someone punching her in the gut." She denies any known recent sick contacts.   Past Medical History:  Diagnosis Date   Anxiety    Back pain    pt states d/t "slipped disc"   Depression    Displaced bimalleolar fracture of left lower leg, initial encounter for closed fracture 05/15/2019   Displaced bimalleolar fracture of left lower leg, subsequent encounter for closed fracture with delayed healing 09/04/2019   Migraines     Past Surgical History:  Procedure Laterality Date   ORIF ANKLE FRACTURE Left 05/15/2019   Procedure: OPEN REDUCTION INTERNAL FIXATION (ORIF) LEFT BIMALLEOLAR ANKLE AND SYNDESMOSIS;  Surgeon: Tarry Kos, MD;  Location: [facility];  Service: Orthopedics;  Laterality: Left;   WISDOM TOOTH EXTRACTION      Family History  Problem Relation Age of Onset   Cancer Mother 61       breast   Hypertension Mother    Diabetes Father 27       type 58   Aortic stenosis Father 57       had AVR in his 37's   Depression Father    Osteogenesis imperfecta Brother    Depression Brother    Cancer Maternal Grandmother  26       breast CA   Glaucoma Maternal Grandfather    Heart disease Maternal Grandfather        cad   Cancer Maternal Grandfather        unsure type   Alcoholism Paternal Grandmother    Emphysema Paternal Grandmother    CVA Paternal Grandfather    Dementia Paternal Grandfather     Social History   Socioeconomic History   Marital status: Single    Spouse name: Not on file   Number of children: Not on file   Years of education: Not on file   Highest education level: Bachelor's degree (e.g., BA, AB, BS)  Occupational History   Not on file  Tobacco Use   Smoking status: Never   Smokeless tobacco: Never  Vaping Use   Vaping Use: Never used  Substance and Sexual Activity   Alcohol use: No    Alcohol/week: 0.0 standard drinks of alcohol   Drug use: No   Sexual activity: Not Currently  Other Topics Concern   Not on file  Social History Narrative   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   Completed a bachelor's degree   Enjoys reading, video games, walks on the greenway   No pets   Social Determinants of  Health   Financial Resource Strain: Medium Risk (05/29/2022)   Overall Financial Resource Strain (CARDIA)    Difficulty of Paying Living Expenses: Somewhat hard  Food Insecurity: Food Insecurity Present (05/29/2022)   Hunger Vital Sign    Worried About Running Out of Food in the Last Year: Sometimes true    Ran Out of Food in the Last Year: Never true  Transportation Needs: No Transportation Needs (05/29/2022)   PRAPARE - Administrator, Civil Service (Medical): No    Lack of Transportation (Non-Medical): No  Physical Activity: Insufficiently Active (05/29/2022)   Exercise Vital Sign    Days of Exercise per Week: 4 days    Minutes of Exercise per Session: 20 min  Stress: Stress Concern Present (05/29/2022)   [employer] of Occupational Health - Occupational Stress Questionnaire    Feeling of Stress : Rather much  Social Connections:  Moderately Isolated (05/29/2022)   Social Connection and Isolation Panel [NHANES]    Frequency of Communication with Friends and Family: Once a week    Frequency of Social Gatherings with Friends and Family: Once a week    Attends Religious Services: More than 4 times per year    Active Member of [employer] or Organizations: Yes    Attends Engineer, structural: More than 4 times per year    Marital Status: Never married  Intimate Partner Violence: Not on file    Outpatient Medications Prior to Visit  Medication Sig Dispense Refill   cefdinir (OMNICEF) 300 MG capsule Take 1 capsule (300 mg total) by mouth 2 (two) times daily. 14 capsule 0   escitalopram (LEXAPRO) 20 MG tablet TAKE 1 TABLET BY MOUTH EVERY DAY 90 tablet 1   ibuprofen (ADVIL) 200 MG tablet Take 200 mg by mouth every 6 (six) hours as needed.     SUMAtriptan (IMITREX) 50 MG tablet 1 tablet by mouth at start of migraine, may repeat in 2 hours if needed (Max 2 tabs/24 hrs) 10 tablet 5   No facility-administered medications prior to visit.    No Known Allergies  Review of Systems  Gastrointestinal:  Positive for abdominal pain and diarrhea.  See HPI.     Objective:    Physical Exam Constitutional:      Appearance: Normal appearance.  HENT:     Head: Normocephalic and atraumatic.     Right Ear: Tympanic membrane, ear canal and external ear normal.     Left Ear: Tympanic membrane, ear canal and external ear normal.     Mouth/Throat:     Mouth: Mucous membranes are dry.     Pharynx: Oropharynx is clear. No oropharyngeal exudate or posterior oropharyngeal erythema.     Comments: Tongue appears dry. Eyes:     Extraocular Movements: Extraocular movements intact.     Pupils: Pupils are equal, round, and reactive to light.  Cardiovascular:     Rate and Rhythm: Normal rate and regular rhythm.     Heart sounds: Normal heart sounds. No murmur heard.    No gallop.  Pulmonary:     Effort: Pulmonary effort is normal. No  respiratory distress.     Breath sounds: Normal breath sounds. No wheezing or rales.  Abdominal:     Tenderness: There is abdominal tenderness in the periumbilical area. There is no guarding.     Comments: Mild periumbilical tenderness without guarding.  Skin:    General: Skin is warm and dry.  Neurological:     General: No focal deficit  present.     Mental Status: She is alert and oriented to person, place, and time.  Psychiatric:        Mood and Affect: Mood normal.        Behavior: Behavior normal.     BP 107/76 (BP Location: Right Arm, Patient Position: Sitting, Cuff Size: Large)   Pulse (!) 106   Temp 97.9 F (36.6 C) (Oral)   Resp 16   Wt 229 lb (103.9 kg)   SpO2 99%   BMI 35.87 kg/m  Wt Readings from Last 3 Encounters:  06/06/22 229 lb (103.9 kg)  05/30/22 233 lb (105.7 kg)  01/13/22 223 lb 9.6 oz (101.4 kg)    Problem List Items Addressed This Visit       Unprioritized   Viral gastroenteritis - Primary    New. Discussed trial of imodium for diarrhea. For nausea may use zofran PRN. She is advised to drink small frequent sips of water/gatorade to avoid dehydration. She is also advised to go to the ER if she develops increased weakness, worsening abdominal pain or if she becomes unable to keep down fluids. Will obtain labs as ordered.       Relevant Orders   Comp Met (CMET)   CBC w/Diff     Meds ordered this encounter  Medications   ondansetron (ZOFRAN) 4 MG tablet    Sig: Take 1 tablet (4 mg total) by mouth every 8 (eight) hours as needed for nausea or vomiting.    Dispense:  20 tablet    Refill:  0    Order Specific Question:   Supervising Provider    Answer:   Danise Edge A [[id number]]    I, Lemont Fillers, NP, personally preformed the services described in this documentation.  All medical record entries made by the scribe were at my direction and in my presence.  I have reviewed the chart and discharge instructions (if applicable) and agree that the  record reflects my personal performance and is accurate and complete. 06/06/2022.  I,Mathew Stumpf,acting as a Neurosurgeon for [employer], NP.,have documented all relevant documentation on the behalf of Lemont Fillers, NP,as directed by  Lemont Fillers, NP while in the presence of Lemont Fillers, NP.   Lemont Fillers, NP

## 2022-06-08 DIAGNOSIS — F411 Generalized anxiety disorder: Secondary | ICD-10-CM | POA: Diagnosis not present

## 2022-06-08 DIAGNOSIS — F331 Major depressive disorder, recurrent, moderate: Secondary | ICD-10-CM | POA: Diagnosis not present

## 2022-06-22 DIAGNOSIS — F331 Major depressive disorder, recurrent, moderate: Secondary | ICD-10-CM | POA: Diagnosis not present

## 2022-06-22 DIAGNOSIS — F411 Generalized anxiety disorder: Secondary | ICD-10-CM | POA: Diagnosis not present

## 2022-06-29 DIAGNOSIS — F411 Generalized anxiety disorder: Secondary | ICD-10-CM | POA: Diagnosis not present

## 2022-06-29 DIAGNOSIS — F331 Major depressive disorder, recurrent, moderate: Secondary | ICD-10-CM | POA: Diagnosis not present

## 2022-07-04 DIAGNOSIS — F411 Generalized anxiety disorder: Secondary | ICD-10-CM | POA: Diagnosis not present

## 2022-07-04 DIAGNOSIS — F321 Major depressive disorder, single episode, moderate: Secondary | ICD-10-CM | POA: Diagnosis not present

## 2022-07-13 DIAGNOSIS — F321 Major depressive disorder, single episode, moderate: Secondary | ICD-10-CM | POA: Diagnosis not present

## 2022-07-13 DIAGNOSIS — F411 Generalized anxiety disorder: Secondary | ICD-10-CM | POA: Diagnosis not present

## 2022-07-20 DIAGNOSIS — F321 Major depressive disorder, single episode, moderate: Secondary | ICD-10-CM | POA: Diagnosis not present

## 2022-07-20 DIAGNOSIS — F411 Generalized anxiety disorder: Secondary | ICD-10-CM | POA: Diagnosis not present

## 2022-08-04 ENCOUNTER — Ambulatory Visit (INDEPENDENT_AMBULATORY_CARE_PROVIDER_SITE_OTHER): Payer: BC Managed Care – PPO | Admitting: Family

## 2022-08-04 ENCOUNTER — Encounter: Payer: Self-pay | Admitting: Family

## 2022-08-04 VITALS — BP 120/72 | HR 78 | Temp 98.0°F | Resp 18 | Ht 67.0 in | Wt 243.0 lb

## 2022-08-04 DIAGNOSIS — G43009 Migraine without aura, not intractable, without status migrainosus: Secondary | ICD-10-CM

## 2022-08-04 DIAGNOSIS — F419 Anxiety disorder, unspecified: Secondary | ICD-10-CM | POA: Diagnosis not present

## 2022-08-04 DIAGNOSIS — G4700 Insomnia, unspecified: Secondary | ICD-10-CM | POA: Insufficient documentation

## 2022-08-04 DIAGNOSIS — F411 Generalized anxiety disorder: Secondary | ICD-10-CM | POA: Diagnosis not present

## 2022-08-04 DIAGNOSIS — R635 Abnormal weight gain: Secondary | ICD-10-CM

## 2022-08-04 DIAGNOSIS — F32A Depression, unspecified: Secondary | ICD-10-CM | POA: Diagnosis not present

## 2022-08-04 DIAGNOSIS — Z0000 Encounter for general adult medical examination without abnormal findings: Secondary | ICD-10-CM

## 2022-08-04 DIAGNOSIS — F321 Major depressive disorder, single episode, moderate: Secondary | ICD-10-CM | POA: Diagnosis not present

## 2022-08-04 DIAGNOSIS — D72829 Elevated white blood cell count, unspecified: Secondary | ICD-10-CM

## 2022-08-04 LAB — CBC WITH DIFFERENTIAL/PLATELET
Basophils Absolute: 0 10*3/uL (ref 0.0–0.1)
Basophils Relative: 0.4 % (ref 0.0–3.0)
Eosinophils Absolute: 0.2 10*3/uL (ref 0.0–0.7)
Eosinophils Relative: 2.6 % (ref 0.0–5.0)
HCT: 38.2 % (ref 36.0–46.0)
Hemoglobin: 12.4 g/dL (ref 12.0–15.0)
Lymphocytes Relative: 46.4 % — ABNORMAL HIGH (ref 12.0–46.0)
Lymphs Abs: 3.7 10*3/uL (ref 0.7–4.0)
MCHC: 32.5 g/dL (ref 30.0–36.0)
MCV: 86.5 fl (ref 78.0–100.0)
Monocytes Absolute: 0.5 10*3/uL (ref 0.1–1.0)
Monocytes Relative: 5.7 % (ref 3.0–12.0)
Neutro Abs: 3.6 10*3/uL (ref 1.4–7.7)
Neutrophils Relative %: 44.9 % (ref 43.0–77.0)
Platelets: 279 10*3/uL (ref 150.0–400.0)
RBC: 4.42 Mil/uL (ref 3.87–5.11)
RDW: 14.6 % (ref 11.5–15.5)
WBC: 7.9 10*3/uL (ref 4.0–10.5)

## 2022-08-04 LAB — TSH: TSH: 3.74 u[IU]/mL (ref 0.35–5.50)

## 2022-08-04 MED ORDER — SUMATRIPTAN SUCCINATE 50 MG PO TABS: 100.0000 mg | ORAL_TABLET | Freq: Once | ORAL | 5 refills | Status: DC

## 2022-08-04 MED ORDER — ESCITALOPRAM OXALATE 10 MG PO TABS: 10.0000 mg | ORAL_TABLET | Freq: Every day | ORAL | 0 refills | Status: DC

## 2022-08-04 NOTE — Assessment & Plan Note (Signed)
Chronic difficulty with sleep onset and maintenance. Discussed sleep hygiene measures including consistent sleep schedule, limiting caffeine intake, and avoiding screen time before bed. -Implement sleep hygiene measures. -Consider non-habit forming sleep aid if no improvement in a few weeks.

## 2022-08-04 NOTE — Assessment & Plan Note (Signed)
-  Encourage flu shot and covid booster in the fall. -Check thyroid function and complete blood count. -Pap up to date -Discussed diet/exercise and weight loss. -Follow up in 3 months.

## 2022-08-04 NOTE — Assessment & Plan Note (Signed)
Recent migraine not fully relieved by Imitrex 50mg . -Increase Imitrex dose to 100mg  at onset of migraine.

## 2022-08-04 NOTE — Assessment & Plan Note (Signed)
Patient reports weight gain and inconsistent diet. Discussed referral to [facility] for comprehensive weight management. -Refer to [facility]. -Encourage incorporation of frozen fruits and vegetables into diet.

## 2022-08-04 NOTE — Assessment & Plan Note (Signed)
On Lexapro 20mg , reports feeling emotionally blunted. -Decrease Lexapro to 10mg  daily. -advised pt to send me a follow up note in my chart letting me know how she is feeling on the reduced dose.

## 2022-08-04 NOTE — Patient Instructions (Signed)
VISIT SUMMARY:  During our visit, we discussed your concerns about insomnia, weight gain, mood changes, and migraines. We also reviewed your current medications and made some adjustments to better manage your symptoms. We discussed the importance of maintaining a healthy lifestyle and regular check-ups for overall health.  YOUR PLAN:  -INSOMNIA: Insomnia is a sleep disorder that can make it hard to fall asleep, hard to stay asleep, or cause you to wake up too early and not be able to get back to sleep. We discussed implementing sleep hygiene measures such as maintaining a consistent sleep schedule, limiting your caffeine intake, and avoiding screen time before bed. If these measures do not improve your sleep in a few weeks, we may consider a non-habit forming sleep aid.  -WEIGHT GAIN: Weight gain can occur for many reasons, and it's important to address it to maintain overall health. We discussed referring you to the [facility] for comprehensive weight management. I also encouraged you to incorporate more frozen fruits and vegetables into your diet.

## 2022-08-04 NOTE — Progress Notes (Signed)
Subjective:     Patient ID: Kylie Ware, female    DOB: 1996-01-07, 27 y.o.   MRN: [id number]  Chief Complaint  Patient presents with   Annual Exam    HPI  Discussed the use of AI scribe software for clinical note transcription with the patient, who gave verbal consent to proceed.  History of Present Illness   The patient, with a history of anxiety and depression managed with Lexapro, presents with insomnia, weight gain, and mood changes. She reports difficulty falling asleep and staying asleep, often waking up multiple times throughout the night. She also notes a change in her diet and a significant weight gain, which she attributes in part to her sleep issues. The patient also reports feeling emotionally numb on her current dose of Lexapro, expressing a desire to decrease the dose.  In addition, the patient experiences migraines, which she manages with Imitrex as needed. However, she reports that the medication did not provide relief during her most recent episode. She also reports a recent increase in her caffeine intake, consuming about 40 ounces of coffee daily, often late into the afternoon.  The patient also mentions a family history of stroke and heart failure, with her uncle recently passing away from these conditions. She denies any changes in her urinary habits, digestion, or menstrual cycle, although she notes that her periods are shorter than average. She also reports maintaining a relatively active lifestyle, averaging 11,000 to 13,000 steps daily.    Patient presents today for complete physical.  Immunizations: up to date  Diet:  Wt Readings from Last 3 Encounters:  08/04/22 243 lb (110.2 kg)  06/06/22 229 lb (103.9 kg)  05/30/22 233 lb (105.7 kg)   Needs improvement Exercise:  some walking Pap Smear: 2023- normal Dental: up to date Vision: due      Health Maintenance Due  Topic Date Due   COVID-19 Vaccine (1) Never done   HIV Screening  Never done    Hepatitis C Screening  Never done    Past Medical History:  Diagnosis Date   Anxiety    Back pain    pt states d/t "slipped disc"   Depression    Displaced bimalleolar fracture of left lower leg, initial encounter for closed fracture 05/15/2019   Displaced bimalleolar fracture of left lower leg, subsequent encounter for closed fracture with delayed healing 09/04/2019   Migraines     Past Surgical History:  Procedure Laterality Date   ORIF ANKLE FRACTURE Left 05/15/2019   Procedure: OPEN REDUCTION INTERNAL FIXATION (ORIF) LEFT BIMALLEOLAR ANKLE AND SYNDESMOSIS;  Surgeon: Tarry Kos, MD;  Location: [facility];  Service: Orthopedics;  Laterality: Left;   WISDOM TOOTH EXTRACTION      Family History  Problem Relation Age of Onset   Cancer Mother 53       breast   Hypertension Mother    Diabetes Father 33       type 29   Aortic stenosis Father 54       had AVR in his 52's   Depression Father    Osteogenesis imperfecta Brother    Depression Brother    Cancer Maternal Grandmother 55       breast CA   Glaucoma Maternal Grandfather    Heart disease Maternal Grandfather        cad   Cancer Maternal Grandfather        unsure type   Alcoholism Paternal Grandmother    Emphysema Paternal  Grandmother    CVA Paternal Grandfather    Dementia Paternal Grandfather    Stroke Paternal Uncle    Heart failure Paternal Uncle     Social History   Socioeconomic History   Marital status: Single    Spouse name: Not on file   Number of children: Not on file   Years of education: Not on file   Highest education level: Bachelor's degree (e.g., BA, AB, BS)  Occupational History   Not on file  Tobacco Use   Smoking status: Never   Smokeless tobacco: Never  Vaping Use   Vaping Use: Never used  Substance and Sexual Activity   Alcohol use: No    Alcohol/week: 0.0 standard drinks of alcohol   Drug use: No   Sexual activity: Not Currently  Other Topics Concern   Not on  file  Social History Narrative   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   Completed a bachelor's degree   Enjoys reading, video games, [place]   No pets   Social Determinants of Health   Financial Resource Strain: Medium Risk (05/29/2022)   Overall Financial Resource Strain (CARDIA)    Difficulty of Paying Living Expenses: Somewhat hard  Food Insecurity: Food Insecurity Present (05/29/2022)   Hunger Vital Sign    Worried About Running Out of Food in the Last Year: Sometimes true    Ran Out of Food in the Last Year: Never true  Transportation Needs: No Transportation Needs (05/29/2022)   PRAPARE - Administrator, Civil Service (Medical): No    Lack of Transportation (Non-Medical): No  Physical Activity: Insufficiently Active (05/29/2022)   Exercise Vital Sign    Days of Exercise per Week: 4 days    Minutes of Exercise per Session: 20 min  Stress: Stress Concern Present (05/29/2022)   [employer] of Occupational Health - Occupational Stress Questionnaire    Feeling of Stress : Rather much  Social Connections: Moderately Isolated (05/29/2022)   Social Connection and Isolation Panel [NHANES]    Frequency of Communication with Friends and Family: Once a week    Frequency of Social Gatherings with Friends and Family: Once a week    Attends Religious Services: More than 4 times per year    Active Member of [employer] or Organizations: Yes    Attends Engineer, structural: More than 4 times per year    Marital Status: Never married  Intimate Partner Violence: Not on file    Outpatient Medications Prior to Visit  Medication Sig Dispense Refill   cefdinir (OMNICEF) 300 MG capsule Take 1 capsule (300 mg total) by mouth 2 (two) times daily. 14 capsule 0   ibuprofen (ADVIL) 200 MG tablet Take 200 mg by mouth every 6 (six) hours as needed.     ondansetron (ZOFRAN) 4 MG tablet Take 1 tablet (4 mg total) by mouth every 8 (eight) hours as needed  for nausea or vomiting. 20 tablet 0   escitalopram (LEXAPRO) 20 MG tablet TAKE 1 TABLET BY MOUTH EVERY DAY 90 tablet 1   SUMAtriptan (IMITREX) 50 MG tablet 1 tablet by mouth at start of migraine, may repeat in 2 hours if needed (Max 2 tabs/24 hrs) 10 tablet 5   No facility-administered medications prior to visit.    No Known Allergies  Review of Systems  Constitutional:  Negative for weight loss.  HENT:  Positive for congestion (? allergies). Negative for hearing loss.  Eyes:  Negative for blurred vision.  Respiratory:  Negative for cough.   Cardiovascular:  Negative for leg swelling.  Gastrointestinal:  Negative for constipation and diarrhea.  Genitourinary:  Negative for dysuria and urgency.  Musculoskeletal:  Negative for joint pain and myalgias.  Skin:  Negative for rash.  Neurological:  Positive for headaches.  Psychiatric/Behavioral:         Reports that her mood is blunted on lexapro       Objective:    Physical Exam   BP 120/72   Pulse 78   Temp 98 F (36.7 C)   Resp 18   Ht 5\' 7"  (1.702 m)   Wt 243 lb (110.2 kg)   LMP 07/14/2022   SpO2 99%   BMI 38.06 kg/m  Wt Readings from Last 3 Encounters:  08/04/22 243 lb (110.2 kg)  06/06/22 229 lb (103.9 kg)  05/30/22 233 lb (105.7 kg)   Physical Exam  Constitutional: She is oriented to person, place, and time. She appears well-developed and well-nourished. No distress.  HENT:  Head: Normocephalic and atraumatic.  Right Ear: Tympanic membrane and ear canal normal.  Left Ear: Tympanic membrane and ear canal normal.  Mouth/Throat: Oropharynx is clear and moist.  Eyes: Pupils are equal, round, and reactive to light. No scleral icterus.  Neck: Normal range of motion. No thyromegaly present.  Cardiovascular: Normal rate and regular rhythm.   No murmur heard. Pulmonary/Chest: Effort normal and breath sounds normal. No respiratory distress. He has no wheezes. She has no rales. She exhibits no tenderness.  Abdominal:  Soft. Bowel sounds are normal. She exhibits no distension and no mass. There is no tenderness. There is no rebound and no guarding.  Musculoskeletal: She exhibits no edema.  Lymphadenopathy:    She has no cervical adenopathy.  Neurological: She is alert and oriented to person, place, and time. She has normal patellar reflexes. She exhibits normal muscle tone. Coordination normal.  Skin: Skin is warm and dry.  Psychiatric: She has a normal mood and affect. Her behavior is normal. Judgment and thought content normal.  Breast/pelvic: deferred           Assessment & Plan:       Assessment & Plan:   Problem List Items Addressed This Visit       Unprioritized   Preventative health care    -Encourage flu shot and covid booster in the fall. -Check thyroid function and complete blood count. -Pap up to date -Discussed diet/exercise and weight loss. -Follow up in 3 months.      Morbid obesity (HCC)     Patient reports weight gain and inconsistent diet. Discussed referral to [facility] for comprehensive weight management. -Refer to [facility]. -Encourage incorporation of frozen fruits and vegetables into diet.      Relevant Orders   Amb Ref to Medical Weight Management   Migraine without aura and without status migrainosus, not intractable     Recent migraine not fully relieved by Imitrex 50mg . -Increase Imitrex dose to 100mg  at onset of migraine.      Relevant Medications   escitalopram (LEXAPRO) 10 MG tablet   SUMAtriptan (IMITREX) 50 MG tablet   Insomnia     Chronic difficulty with sleep onset and maintenance. Discussed sleep hygiene measures including consistent sleep schedule, limiting caffeine intake, and avoiding screen time before bed. -Implement sleep hygiene measures. -Consider non-habit forming sleep aid if no improvement in a few weeks.  Anxiety and depression - Primary    On Lexapro 20mg , reports feeling  emotionally blunted. -Decrease Lexapro to 10mg  daily. -advised pt to send me a follow up note in my chart letting me know how she is feeling on the reduced dose.       Relevant Medications   escitalopram (LEXAPRO) 10 MG tablet   Other Visit Diagnoses     Weight gain       Relevant Orders   TSH   Leukocytosis, unspecified type       Relevant Orders   CBC w/Diff       I have discontinued Kylie Ware "Ellie"'s escitalopram. I have also changed her SUMAtriptan. Additionally, I am having her start on escitalopram. Lastly, I am having her maintain her ibuprofen, cefdinir, and ondansetron.  Meds ordered this encounter  Medications   escitalopram (LEXAPRO) 10 MG tablet    Sig: Take 1 tablet (10 mg total) by mouth daily.    Dispense:  90 tablet    Refill:  0    Order Specific Question:   Supervising Provider    Answer:   Danise Edge A [[id number]]   SUMAtriptan (IMITREX) 50 MG tablet    Sig: Take 2 tablets (100 mg total) by mouth once for 1 dose.    Dispense:  10 tablet    Refill:  5    Order Specific Question:   Supervising Provider    Answer:   Danise Edge A [[id number]]

## 2022-08-07 DIAGNOSIS — F321 Major depressive disorder, single episode, moderate: Secondary | ICD-10-CM | POA: Diagnosis not present

## 2022-08-07 DIAGNOSIS — F411 Generalized anxiety disorder: Secondary | ICD-10-CM | POA: Diagnosis not present

## 2022-08-15 DIAGNOSIS — F411 Generalized anxiety disorder: Secondary | ICD-10-CM | POA: Diagnosis not present

## 2022-08-15 DIAGNOSIS — F321 Major depressive disorder, single episode, moderate: Secondary | ICD-10-CM | POA: Diagnosis not present

## 2022-08-19 ENCOUNTER — Other Ambulatory Visit: Payer: Self-pay | Admitting: Family

## 2022-08-24 ENCOUNTER — Encounter (INDEPENDENT_AMBULATORY_CARE_PROVIDER_SITE_OTHER): Payer: Self-pay | Admitting: Physician Assistant

## 2022-08-24 ENCOUNTER — Ambulatory Visit (INDEPENDENT_AMBULATORY_CARE_PROVIDER_SITE_OTHER): Payer: BC Managed Care – PPO | Admitting: Physician Assistant

## 2022-08-24 VITALS — BP 121/77 | HR 84 | Temp 98.0°F | Ht 66.0 in | Wt 244.0 lb

## 2022-08-24 DIAGNOSIS — G8929 Other chronic pain: Secondary | ICD-10-CM

## 2022-08-24 DIAGNOSIS — Z6839 Body mass index (BMI) 39.0-39.9, adult: Secondary | ICD-10-CM

## 2022-08-24 DIAGNOSIS — Z0289 Encounter for other administrative examinations: Secondary | ICD-10-CM

## 2022-08-24 DIAGNOSIS — F419 Anxiety disorder, unspecified: Secondary | ICD-10-CM

## 2022-08-24 DIAGNOSIS — F32A Depression, unspecified: Secondary | ICD-10-CM

## 2022-08-24 DIAGNOSIS — M545 Low back pain, unspecified: Secondary | ICD-10-CM | POA: Diagnosis not present

## 2022-08-24 DIAGNOSIS — E6609 Other obesity due to excess calories: Secondary | ICD-10-CM

## 2022-08-24 NOTE — Progress Notes (Signed)
Office: [phone]  /  Fax: [phone]   Initial Visit  Kylie Ware was seen in clinic today to evaluate for obesity. She is interested in losing weight to improve overall health and reduce the risk of weight related complications. She presents today to review program treatment options, initial physical assessment, and evaluation.     She was referred by: PCP  When asked what else they would like to accomplish? She states: Adopt healthier eating patterns, Improve energy levels and physical activity, Improve existing medical conditions, Improve quality of life, Improve appearance, Improve self-confidence, and Lose a target amount of weight : 96 lbs  Weight history: Life long issues with weight  When asked how has your weight affected you? She states: Has affected self-esteem, Relationships, Contributed to orthopedic problems or mobility issues, Having fatigue, Having poor endurance, Problems with eating patterns, and Has affected mood   Some associated conditions: Other: back issues  Contributing factors: Family history, Disruption of circadian rhythm, Nutritional, Medications, Stress, Eating patterns, and Mental health problems  Weight promoting medications identified: Psychotropic medications  Current nutrition plan: None and Low-carb  Current level of physical activity: Walking  Current or previous pharmacotherapy: None  Response to medication: Never tried medications   Past medical history includes:   Past Medical History:  Diagnosis Date   Anxiety    Back pain    pt states d/t "slipped disc"   Depression    Displaced bimalleolar fracture of left lower leg, initial encounter for closed fracture 05/15/2019   Displaced bimalleolar fracture of left lower leg, subsequent encounter for closed fracture with delayed healing 09/04/2019   Migraines      Objective:   BP 121/77   Pulse 84   Temp 98 F (36.7 C)   Ht 5\' 6"  (1.676 m)   Wt 244 lb (110.7 kg)   LMP  07/14/2022   SpO2 99%   BMI 39.38 kg/m  She was weighed on the bioimpedance scale: Body mass index is 39.38 kg/m.  Peak Weight:287 lbs , Body Fat%:47.6%, Visceral Fat Rating:11, Weight trend over the last 12 months: Unchanged  General:  Alert, oriented and cooperative. Patient is in no acute distress.  Respiratory: Normal respiratory effort, no problems with respiration noted   Gait: able to ambulate independently  Mental Status: Normal mood and affect. Normal behavior. Normal judgment and thought content.   DIAGNOSTIC DATA REVIEWED:  BMET    Component Value Date/Time   NA 138 06/06/2022 1020   K 3.8 06/06/2022 1020   CL 103 06/06/2022 1020   CO2 25 06/06/2022 1020   GLUCOSE 84 06/06/2022 1020   BUN 8 06/06/2022 1020   CREATININE 0.59 06/06/2022 1020   CREATININE 0.60 08/02/2012 1440   CALCIUM 9.3 06/06/2022 1020   Lab Results  Component Value Date   HGBA1C 5.4 08/02/2012   No results found for: "INSULIN" CBC    Component Value Date/Time   WBC 7.9 08/04/2022 0732   RBC 4.42 08/04/2022 0732   HGB 12.4 08/04/2022 0732   HCT 38.2 08/04/2022 0732   PLT 279.0 08/04/2022 0732   MCV 86.5 08/04/2022 0732   MCHC 32.5 08/04/2022 0732   RDW 14.6 08/04/2022 0732   Iron/TIBC/Ferritin/ %Sat No results found for: "IRON", "TIBC", "FERRITIN", "IRONPCTSAT" Lipid Panel     Component Value Date/Time   CHOL 194 08/01/2021 0950   TRIG 54.0 08/01/2021 0950   HDL 81.60 08/01/2021 0950   CHOLHDL 2 08/01/2021 0950   VLDL 10.8 08/01/2021 0950  LDLCALC 102 (H) 08/01/2021 0950   Hepatic Function Panel     Component Value Date/Time   PROT 6.8 06/06/2022 1020   ALBUMIN 3.9 06/06/2022 1020   AST 9 06/06/2022 1020   ALT 11 06/06/2022 1020   ALKPHOS 59 06/06/2022 1020   BILITOT 0.3 06/06/2022 1020      Component Value Date/Time   TSH 3.74 08/04/2022 0732     Assessment and Plan:   Anxiety and depression  Chronic midline low back pain without sciatica  Class 2 obesity due  to excess calories without serious comorbidity with body mass index (BMI) of 39.0 to 39.9 in adult    Obesity Treatment / Action Plan:  Patient will work on garnering support from family and friends to begin weight loss journey. Will work on eliminating or reducing the presence of highly palatable, calorie dense foods in the home. Will complete provided nutritional and psychosocial assessment questionnaire before the next appointment. Will be scheduled for indirect calorimetry to determine resting energy expenditure in a fasting state.  This will allow [place] to create a reduced calorie, high-protein meal plan to promote loss of fat mass while preserving muscle mass. Will work on managing stress via relaxation methods as this may result in unhealthy eating patterns. Counseled on the health benefits of losing 5%-15% of total body weight. Was counseled on nutritional approaches to weight loss and benefits of reducing processed foods and consuming plant-based foods and high quality protein as part of nutritional weight management. Was counseled on pharmacotherapy and role as an adjunct in weight management.   Obesity Education Performed Today:  She was weighed on the bioimpedance scale and results were discussed and documented in the synopsis.  We discussed obesity as a disease and the importance of a more detailed evaluation of all the factors contributing to the disease.  We discussed the importance of long term lifestyle changes which include nutrition, exercise and behavioral modifications as well as the importance of customizing this to her specific health and social needs.  We discussed the benefits of reaching a healthier weight to alleviate the symptoms of existing conditions and reduce the risks of the biomechanical, metabolic and psychological effects of obesity.  Kylie Ware appears to be in the action stage of change and states they are ready to start intensive lifestyle  modifications and behavioral modifications.  30 minutes was spent today on this visit including the above counseling, pre-visit chart review, and post-visit documentation.  Reviewed by clinician on day of visit: allergies, medications, problem list, medical history, surgical history, family history, social history, and previous encounter notes pertinent to obesity diagnosis.   Jodi Criscuolo,PA-C

## 2022-08-28 DIAGNOSIS — F411 Generalized anxiety disorder: Secondary | ICD-10-CM | POA: Diagnosis not present

## 2022-08-28 DIAGNOSIS — F321 Major depressive disorder, single episode, moderate: Secondary | ICD-10-CM | POA: Diagnosis not present

## 2022-09-18 DIAGNOSIS — F321 Major depressive disorder, single episode, moderate: Secondary | ICD-10-CM | POA: Diagnosis not present

## 2022-09-18 DIAGNOSIS — F411 Generalized anxiety disorder: Secondary | ICD-10-CM | POA: Diagnosis not present

## 2022-09-21 ENCOUNTER — Encounter (INDEPENDENT_AMBULATORY_CARE_PROVIDER_SITE_OTHER): Payer: Self-pay

## 2022-09-26 DIAGNOSIS — F321 Major depressive disorder, single episode, moderate: Secondary | ICD-10-CM | POA: Diagnosis not present

## 2022-09-26 DIAGNOSIS — F411 Generalized anxiety disorder: Secondary | ICD-10-CM | POA: Diagnosis not present

## 2022-09-27 ENCOUNTER — Ambulatory Visit (INDEPENDENT_AMBULATORY_CARE_PROVIDER_SITE_OTHER): Payer: BC Managed Care – PPO | Admitting: Family Medicine

## 2022-09-27 ENCOUNTER — Encounter (INDEPENDENT_AMBULATORY_CARE_PROVIDER_SITE_OTHER): Payer: Self-pay | Admitting: Family Medicine

## 2022-09-27 VITALS — BP 118/83 | HR 79 | Temp 98.3°F | Ht 66.5 in | Wt 248.0 lb

## 2022-09-27 DIAGNOSIS — G43909 Migraine, unspecified, not intractable, without status migrainosus: Secondary | ICD-10-CM | POA: Diagnosis not present

## 2022-09-27 DIAGNOSIS — R0602 Shortness of breath: Secondary | ICD-10-CM | POA: Diagnosis not present

## 2022-09-27 DIAGNOSIS — Z6839 Body mass index (BMI) 39.0-39.9, adult: Secondary | ICD-10-CM

## 2022-09-27 DIAGNOSIS — R5383 Other fatigue: Secondary | ICD-10-CM

## 2022-09-27 DIAGNOSIS — F39 Unspecified mood [affective] disorder: Secondary | ICD-10-CM | POA: Diagnosis not present

## 2022-09-27 DIAGNOSIS — Z1331 Encounter for screening for depression: Secondary | ICD-10-CM

## 2022-09-27 NOTE — Progress Notes (Signed)
Carlye Grippe, D.O.  ABFM, ABOM Specializing in Clinical Bariatric Medicine Office located at: [address]  [place], [place]  [address]     Bariatric Medicine Visit  Dear Sandford Craze, NP   Thank you for referring Kylie Ware to our clinic today for evaluation.  We performed a consultation to discuss her options for treatment and educate the patient on her disease state.  The following note includes my evaluation and treatment recommendations.   Please do not hesitate to reach out to me directly if you have any further concerns.    Assessment and Plan:  Review labs with pt next OV Orders Placed This Encounter  Procedures   CBC with Differential/Platelet   VITAMIN D 25 Hydroxy (Vit-D Deficiency, Fractures)   Comprehensive metabolic panel   Folate   Hemoglobin A1c   Insulin, random   Lipid Panel With LDL/HDL Ratio   T4, free   TSH   Vitamin B12   EKG 12-Lead    Medications Discontinued During This Encounter  Medication Reason   cefdinir (OMNICEF) 300 MG capsule    ondansetron (ZOFRAN) 4 MG tablet     Fatigue Assessment:  Kylie Ware does feel that her weight is causing her energy to be lower than it should be. Fatigue may be related to obesity, depression or many other causes. she does not appear to have any red flag symptoms and this appears to most likely be related to her current lifestyle habits and dietary intake.  Plan:  Labs will be ordered and reviewed with her at their next office visit in two weeks.  Epworth sleepiness scale score appears to be within normal limits.  Her ESS score is 5.   Kylie Ware denies daytime somnolence and admits to waking up still tired. Patient has a history of symptoms of morning headache. Kylie Ware generally gets 5 hours of sleep per night, and states that she has a few night-time awakening episodes. Snoring is present. Apneic episodes are present.   ECG: Performed and reviewed/ interpreted independently.  Normal  sinus rhythm, rate 77bpm; reassuring without any acute abnormalities, will continue to monitor for symptoms   Modified PHQ-9 Depression Screen: Her Food and Mood (modified PHQ-9) score was 23.  In the meanwhile, Kylie Ware will focus on self care including making healthy food choices by following their meal plan, improving sleep quality and focusing on stress reduction.  Once we are assured she is on an appropriate meal plan, we will start discussing exercise to increase cardiovascular fitness levels.    Shortness of breath on exertion Assessment:  Kylie Ware does feel that she gets out of breath more easily than she used to when she exercises and seems to be worsening over time with weight gain.  This has gotten worse recently. Kylie Ware denies shortness of breath at rest or orthopnea. Kylie Ware's shortness of breath appears to be obesity related and exercise induced, as they do not appear to have any "red flag" symptoms/ concerns today.  Also, this condition appears to be related to a state of poor cardiovascular conditioning   Plan:  Obtain labs today and will be reviewed with her at their next office visit in two weeks.  Indirect Calorimeter completed today to help guide our dietary regimen. It shows a VO2 of 280 and a REE of 1930.  Her calculated basal metabolic rate is 6213 thus her measured basal metabolic rate is better than expected.  Patient agreed to work on weight loss at this time.  As Lanora Manis  progresses through our weight loss program, we will gradually increase exercise as tolerated to treat her current condition.   If Tundra follows our recommendations and loses 5-10% of their weight without improvement of her shortness of breath or if at any time, symptoms become more concerning, they agree to urgently follow up with their PCP/ specialist for further consideration/ evaluation.   Kylie Ware verbalizes agreement with this plan.   Mood disorder (HCC)- emotional eating Assessment:  Condition is Not at goal.. Admits to any SI/HI. Mood is not stable. She occasionally feels as her weight makes her feel like life is hopeless. She has attempted self harm in the past once. She currently has no suicidal ideations. She has suffered from this for 10+ years and went on medication about 60yrs after it first started. She she's a counselor every other week. This is treated with Lexapro and she tolerates this well.  Cravings and hunger are not well controlled. 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 She feels guilt when she makes poor food choices, and was judged harshly about her weight and eating growing up. She feels out of control with her eating and tends to over eat.   Plan: - Continue Lexapro 10mg  daily prescribed by her PCP.   - I advised that she continue to see her counselor and mention emotional driven eating.   - Patient was referred to Dr. Dewaine Conger, our Bariatric Psychologist, for evaluation due to her elevated PHQ-9 score and significant struggles with emotional eating. She declines to see Dr. Dewaine Conger at this time.   - Discussed how thoughts affect eating habits, modeling of thoughts, feelings, and behaviors, and strategies for change.  Importance of not skipping meals and getting all her proteins and fiber in on a daily basis discussed.    - Discussed cognitive distortions, coping thoughts, and how to change our thoughts/ self talk regarding foods/ eating patterns.  - Reminded patient of the importance of following their prudent nutrition plan and how food can affect mood as well to support emotional wellbeing.   - We will continue to monitor closely alongside PCP and counselor.    Migraine without status migrainosus, not intractable, unspecified migraine type Assessment: Condition is Not at goal.. She was previously on Topiramate and had adverse side effects as it made her speech slurred. She states that she has migraines about 2-3 times a week. She currently  takes ibuprofen and  Imitrex 50mg  prn. Her PCP currently treats her migraines.    Plan: - Continue to take Imitrex and Ibuprofen at current dose.   - Follow-up with PCP if condition worsens.    TREATMENT PLAN FOR OBESITY: BMI 39.0-39.9,adult Morbid obesity (HCC)-starting BMI 09/27/22 39.43 Assessment: Muscle mass is  124lb. Fat mass is 117.6lb.Total body water is 97.6lb.   Plan:  Kylie Ware will work on healthier eating habits and try their best to follow the Category 2 meal plan best they can.   Behavioral Intervention Additional resources provided today: Provided hand out on proper plate portions, healthy complex carbs and sources of protein as well as generaly healthy eating concepts and category 2 meal plan information with breakfast and lunch options.  Evidence-based interventions for health behavior change were utilized today including the discussion of self monitoring techniques, problem-solving barriers and SMART goal setting techniques.   Regarding patient's less desirable eating habits and patterns, we employed the technique of small changes.  Pt will specifically work on: Beginning her Category 2 meal plan and not  skipping meals for next visit.    FOLLOW UP: Follow up in 2 weeks. She was informed of the importance of frequent follow up visits to maximize her success with intensive lifestyle modifications for her multiple health conditions.  Kylie Ware is aware that we will review all of her lab results at our next visit.  She is aware that if anything is critical/ life threatening with the results, we will be contacting her via MyChart prior to the office visit to discuss management.  Chief Complaint:   OBESITY Kylie Ware (MR# [id number]) is a 27 y.o. female who presents for evaluation and treatment of obesity and related comorbidities. Current BMI is Body mass index is 39.43 kg/m. Genevy has been struggling with her weight for many years and has been unsuccessful  in either losing weight, maintaining weight loss, or reaching her healthy weight goal.  Kylie Ware is currently in the action stage of change and ready to dedicate time achieving and maintaining a healthier weight. Shira is interested in becoming our patient and working on intensive lifestyle modifications including (but not limited to) diet and exercise for weight loss.  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 Patient is single and does not have children. She lives with her roommate.  Kylie Ware wants to weigh at least 135lbs in the next 2-3 years. She hopes that this weight loss will help her feel better, have more energy, enjoy life, and avoid heart disease, diabetes, any other health complications related to weight gain. She feels as being lazy, stress eating, and sleep disturbance are all contributing factors on the reason she gained weight. Kylie Ware occasionally works out and walks about 7500-11500 steps daily as she tracks this on her fitness tracker.  Patient states an high protein diet works best for her and she was able to lose 40lbs and kept 20lbs off. She eats out about 2-3 times per week and eats fast food about 2-3 times per week. She goes grocery shopping once a month and does not like to cook. She feels as her biggest cooking obstacle is not knowing what to eat/cook, that it is time consuming, and ingredients are expensive. She usually craves sweets, cheese, pasta, and has increased cravings mostly at night or after dinner. She tends to snack on chips, cheese, pretzels. She drinks coffee with creamer and sweetener, teas with sweeteners, milk, and protein drinks. She dislikes strong smelling foods, cabbage, peas, and steamed veggies. Kylie Ware skips breakfast every day and lunch a few times a week. She feels as her worst eating habit is "binge eating on junk food". She informed me that healthy eating with family is difficult as most things they eat together tend to be  heavier foods.    Subjective:   This is the patient's first visit at [facility].  The patient's NEW PATIENT PACKET that they filled out prior to today's office visit was reviewed at length and information from that paperwork was included within the following office visit note.    Included in the packet: current and past health history, medications, allergies, ROS, gynecologic history (women only), surgical history, family history, social history, weight history, weight loss surgery history (for those that have had weight loss surgery), nutritional evaluation, mood and food questionnaire along with a depression screening (PHQ9) on all patients, an Epworth questionnaire, sleep habits questionnaire, patient life and health improvement goals questionnaire. These will all be scanned into the patient's chart under the "  media" tab.   Review of Systems: Please refer to new patient packet scanned into media. Pertinent positives were addressed with patient today.  Reviewed by clinician on day of visit: allergies, medications, problem list, medical history, surgical history, family history, social history, and previous encounter notes.  During the visit, I independently reviewed the patient's EKG, bioimpedance scale results, and indirect calorimeter results. I used this information to tailor a meal plan for the patient that will help Arnette Norris to lose weight and will improve her obesity-related conditions going forward.  I performed a medically necessary appropriate examination and/or evaluation. I discussed the assessment and treatment plan with the patient. The patient was provided an opportunity to ask questions and all were answered. The patient agreed with the plan and demonstrated an understanding of the instructions. Labs were ordered today (unless patient declined them) and will be reviewed with the patient at our next visit unless more critical results need to be addressed  immediately. Clinical information was updated and documented in the EMR.   Objective:   PHYSICAL EXAM: Blood pressure 118/83, pulse 79, temperature 98.3 F (36.8 C), height 5' 6.5" (1.689 m), weight 248 lb (112.5 kg), SpO2 99%. Body mass index is 39.43 kg/m. General: Well Developed, well nourished, and in no acute distress.  HEENT: Normocephalic, atraumatic Skin: Warm and dry, cap RF less 2 sec, good turgor Chest:  Normal excursion, shape, no gross abn Respiratory: speaking in full sentences, no conversational dyspnea NeuroM-Sk: Ambulates w/o assistance, moves * 4 Psych: A and O *3, insight good, mood-full  Anthropometric Measurements Height: 5' 6.5" (1.689 m) Weight: 248 lb (112.5 kg) BMI (Calculated): 39.43 Weight at Last Visit: na Weight Lost Since Last Visit: na Weight Gained Since Last Visit: na Starting Weight: 248lb Total Weight Loss (lbs): 0 lb (0 kg) Peak Weight: 281lb Waist Measurement : 40 inches   Body Composition  Body Fat %: 47.4 % Fat Mass (lbs): 117.6 lbs Muscle Mass (lbs): 124 lbs Total Body Water (lbs): 97.6 lbs Visceral Fat Rating : 11   Other Clinical Data RMR: 1930 Fasting: yes Labs: yes Today's Visit #: 1 Starting Date: 09/27/22 Comments: first visit    DIAGNOSTIC DATA REVIEWED:  BMET    Component Value Date/Time   NA 138 06/06/2022 1020   K 3.8 06/06/2022 1020   CL 103 06/06/2022 1020   CO2 25 06/06/2022 1020   GLUCOSE 84 06/06/2022 1020   BUN 8 06/06/2022 1020   CREATININE 0.59 06/06/2022 1020   CREATININE 0.60 08/02/2012 1440   CALCIUM 9.3 06/06/2022 1020   Lab Results  Component Value Date   HGBA1C 5.4 08/02/2012   No results found for: "INSULIN" Lab Results  Component Value Date   TSH 3.74 08/04/2022   CBC    Component Value Date/Time   WBC 7.9 08/04/2022 0732   RBC 4.42 08/04/2022 0732   HGB 12.4 08/04/2022 0732   HCT 38.2 08/04/2022 0732   PLT 279.0 08/04/2022 0732   MCV 86.5 08/04/2022 0732   MCHC 32.5  08/04/2022 0732   RDW 14.6 08/04/2022 0732   Iron Studies No results found for: "IRON", "TIBC", "FERRITIN", "IRONPCTSAT" Lipid Panel     Component Value Date/Time   CHOL 194 08/01/2021 0950   TRIG 54.0 08/01/2021 0950   HDL 81.60 08/01/2021 0950   CHOLHDL 2 08/01/2021 0950   VLDL 10.8 08/01/2021 0950   LDLCALC 102 (H) 08/01/2021 0950   Hepatic Function Panel     Component Value Date/Time  PROT 6.8 06/06/2022 1020   ALBUMIN 3.9 06/06/2022 1020   AST 9 06/06/2022 1020   ALT 11 06/06/2022 1020   ALKPHOS 59 06/06/2022 1020   BILITOT 0.3 06/06/2022 1020      Component Value Date/Time   TSH 3.74 08/04/2022 0732   Nutritional No results found for: "VD25OH"  Attestation Statements:   I, Clinical biochemist, acting as a Stage manager for [employer], DO., have compiled all relevant documentation for today's office visit on behalf of Thomasene Lot, DO, while in the presence of [employer], DO.  Time spent on visit including pre-visit chart review and post-visit care was estimated to be 51 minutes. Over 50% of the time was spent in direct face to face counseling and coordination of care.  I have reviewed the above documentation for accuracy and completeness, and I agree with the above. Carlye Grippe, D.O.  The 21st Century Cures Act was signed into law in 2016 which includes the topic of electronic health records.  This provides immediate access to information in MyChart.  This includes consultation notes, operative notes, office notes, lab results and pathology reports.  If you have any questions about what you read please let us know at your next visit so we can discuss your concerns and take corrective action if need be.  We are right here with you.

## 2022-09-28 LAB — CBC WITH DIFFERENTIAL/PLATELET
Basophils Absolute: 0 10*3/uL (ref 0.0–0.2)
Basos: 1 %
EOS (ABSOLUTE): 0.2 10*3/uL (ref 0.0–0.4)
Eos: 3 %
Hematocrit: 40.9 % (ref 34.0–46.6)
Hemoglobin: 12.8 g/dL (ref 11.1–15.9)
Immature Grans (Abs): 0 10*3/uL (ref 0.0–0.1)
Immature Granulocytes: 0 %
Lymphocytes Absolute: 2.3 10*3/uL (ref 0.7–3.1)
Lymphs: 37 %
MCH: 27.5 pg (ref 26.6–33.0)
MCHC: 31.3 g/dL — ABNORMAL LOW (ref 31.5–35.7)
MCV: 88 fL (ref 79–97)
Monocytes Absolute: 0.4 10*3/uL (ref 0.1–0.9)
Monocytes: 6 %
Neutrophils Absolute: 3.2 10*3/uL (ref 1.4–7.0)
Neutrophils: 53 %
Platelets: 285 10*3/uL (ref 150–450)
RBC: 4.66 x10E6/uL (ref 3.77–5.28)
RDW: 12.4 % (ref 11.7–15.4)
WBC: 6.1 10*3/uL (ref 3.4–10.8)

## 2022-09-28 LAB — COMPREHENSIVE METABOLIC PANEL
ALT: 24 IU/L (ref 0–32)
AST: 17 IU/L (ref 0–40)
Albumin: 4.3 g/dL (ref 4.0–5.0)
Alkaline Phosphatase: 93 IU/L (ref 44–121)
BUN/Creatinine Ratio: 16 (ref 9–23)
BUN: 9 mg/dL (ref 6–20)
Bilirubin Total: <0.2 mg/dL (ref 0.0–1.2)
CO2: 22 mmol/L (ref 20–29)
Calcium: 9.3 mg/dL (ref 8.7–10.2)
Chloride: 103 mmol/L (ref 96–106)
Creatinine, Ser: 0.55 mg/dL — ABNORMAL LOW (ref 0.57–1.00)
Globulin, Total: 2.4 g/dL (ref 1.5–4.5)
Glucose: 86 mg/dL (ref 70–99)
Potassium: 4.4 mmol/L (ref 3.5–5.2)
Sodium: 140 mmol/L (ref 134–144)
Total Protein: 6.7 g/dL (ref 6.0–8.5)
eGFR: 129 mL/min/{1.73_m2} (ref >59)

## 2022-09-28 LAB — VITAMIN B12: Vitamin B-12: 427 pg/mL (ref 232–1245)

## 2022-09-28 LAB — LIPID PANEL WITH LDL/HDL RATIO
Cholesterol, Total: 222 mg/dL — ABNORMAL HIGH (ref 100–199)
HDL: 103 mg/dL (ref >39)
LDL Chol Calc (NIH): 110 mg/dL — ABNORMAL HIGH (ref 0–99)
LDL/HDL Ratio: 1.1 ratio (ref 0.0–3.2)
Triglycerides: 51 mg/dL (ref 0–149)
VLDL Cholesterol Cal: 9 mg/dL (ref 5–40)

## 2022-09-28 LAB — VITAMIN D 25 HYDROXY (VIT D DEFICIENCY, FRACTURES): Vit D, 25-Hydroxy: 20.2 ng/mL — ABNORMAL LOW (ref 30.0–100.0)

## 2022-09-28 LAB — TSH: TSH: 3.53 u[IU]/mL (ref 0.450–4.500)

## 2022-09-28 LAB — T4, FREE: Free T4: 1.09 ng/dL (ref 0.82–1.77)

## 2022-09-28 LAB — FOLATE: Folate: 7.4 ng/mL (ref >3.0)

## 2022-09-28 LAB — HEMOGLOBIN A1C
Est. average glucose Bld gHb Est-mCnc: 108 mg/dL
Hgb A1c MFr Bld: 5.4 % (ref 4.8–5.6)

## 2022-09-28 LAB — INSULIN, RANDOM: INSULIN: 13.6 u[IU]/mL (ref 2.6–24.9)

## 2022-10-06 DIAGNOSIS — F411 Generalized anxiety disorder: Secondary | ICD-10-CM | POA: Diagnosis not present

## 2022-10-06 DIAGNOSIS — F321 Major depressive disorder, single episode, moderate: Secondary | ICD-10-CM | POA: Diagnosis not present

## 2022-10-10 DIAGNOSIS — F321 Major depressive disorder, single episode, moderate: Secondary | ICD-10-CM | POA: Diagnosis not present

## 2022-10-10 DIAGNOSIS — F411 Generalized anxiety disorder: Secondary | ICD-10-CM | POA: Diagnosis not present

## 2022-10-18 ENCOUNTER — Ambulatory Visit (INDEPENDENT_AMBULATORY_CARE_PROVIDER_SITE_OTHER): Payer: BC Managed Care – PPO | Admitting: Family Medicine

## 2022-10-18 ENCOUNTER — Encounter (INDEPENDENT_AMBULATORY_CARE_PROVIDER_SITE_OTHER): Payer: Self-pay

## 2022-10-23 DIAGNOSIS — F411 Generalized anxiety disorder: Secondary | ICD-10-CM | POA: Diagnosis not present

## 2022-10-23 DIAGNOSIS — F321 Major depressive disorder, single episode, moderate: Secondary | ICD-10-CM | POA: Diagnosis not present

## 2022-10-30 ENCOUNTER — Ambulatory Visit (INDEPENDENT_AMBULATORY_CARE_PROVIDER_SITE_OTHER): Payer: BC Managed Care – PPO | Admitting: Family Medicine

## 2022-10-30 VITALS — BP 123/80 | HR 67 | Temp 98.2°F | Ht 66.5 in | Wt 248.0 lb

## 2022-10-30 DIAGNOSIS — E88819 Insulin resistance, unspecified: Secondary | ICD-10-CM | POA: Diagnosis not present

## 2022-10-30 DIAGNOSIS — Z6839 Body mass index (BMI) 39.0-39.9, adult: Secondary | ICD-10-CM | POA: Insufficient documentation

## 2022-10-30 DIAGNOSIS — E559 Vitamin D deficiency, unspecified: Secondary | ICD-10-CM | POA: Diagnosis not present

## 2022-10-30 MED ORDER — METFORMIN HCL 500 MG PO TABS
500.0000 mg | ORAL_TABLET | Freq: Every day | ORAL | 0 refills | Status: DC
Start: 2022-10-30 — End: 2022-11-22

## 2022-10-30 MED ORDER — VITAMIN D (ERGOCALCIFEROL) 1.25 MG (50000 UNIT) PO CAPS: 50000.0000 [IU] | ORAL_CAPSULE | ORAL | 0 refills | Status: DC

## 2022-10-31 DIAGNOSIS — F411 Generalized anxiety disorder: Secondary | ICD-10-CM | POA: Diagnosis not present

## 2022-10-31 DIAGNOSIS — F321 Major depressive disorder, single episode, moderate: Secondary | ICD-10-CM | POA: Diagnosis not present

## 2022-10-31 NOTE — Progress Notes (Unsigned)
Chief Complaint:   OBESITY Kylie Ware is here to discuss her progress with her obesity treatment plan along with follow-up of her obesity related diagnoses. Kylie Ware is on the Category 2 Plan and states she is following her eating plan approximately 25% of the time. Kylie Ware states she is doing 0 minutes 0 times per week.  Today's visit was #: 2 Starting weight: 248 lbs Starting date: 09/27/2022 Today's weight: 248 lbs Today's date: 10/30/2022 Total lbs lost to date: 0 Total lbs lost since last in-office visit: 0  Interim History: Patient especially struggled to meal plan and prep.  She feels she will be able to do better with this.  She notes her hunger has been an issue.  Subjective:   1. Vitamin D deficiency Patient's vitamin D level is low, and she is not on vitamin D supplementation and notes fatigue.  I discussed labs with the patient today.  2. Insulin resistance Patient has a family history of diabetes mellitus.  She notes polyphagia, and she is not currently sexually active.  I discussed labs with the patient today.  Assessment/Plan:   1. Vitamin D deficiency Patient agreed to start prescription vitamin D 50,000 IU once weekly with no refills.  - Vitamin D, Ergocalciferol, (DRISDOL) 1.25 MG (50000 UNIT) CAPS capsule; Take 1 capsule (50,000 Units total) by mouth every 7 (seven) days.  Dispense: 5 capsule; Refill: 0  2. Insulin resistance Patient agreed to start metformin 500 mg every morning with food, with no refills.  Patient will continue to work on her diet and exercise, and we will follow-up at her next visit in 2 weeks.  - metFORMIN (GLUCOPHAGE) 500 MG tablet; Take 1 tablet (500 mg total) by mouth daily with breakfast.  Dispense: 30 tablet; Refill: 0  3. BMI 39.0-39.9,adult  4. Morbid obesity (HCC)-starting BMI 09/27/22 39.43 Kylie Ware is currently in the action stage of change. As such, her goal is to continue with weight loss efforts. She has agreed to the  Category 2 Plan.   Exercise goals: For substantial health benefits, adults should do at least 150 minutes (2 hours and 30 minutes) a week of moderate-intensity, or 75 minutes (1 hour and 15 minutes) a week of vigorous-intensity aerobic physical activity, or an equivalent combination of moderate- and vigorous-intensity aerobic activity. Aerobic activity should be performed in episodes of at least 10 minutes, and preferably, it should be spread throughout the week.  Behavioral modification strategies: increasing lean protein intake and meal planning and cooking strategies.  Kylie Ware has agreed to follow-up with our clinic in 2 weeks. She was informed of the importance of frequent follow-up visits to maximize her success with intensive lifestyle modifications for her multiple health conditions.   Objective:   Blood pressure 123/80, pulse 67, temperature 98.2 F (36.8 C), height 5' 6.5" (1.689 m), weight 248 lb (112.5 kg), SpO2 96%. Body mass index is 39.43 kg/m.  Lab Results  Component Value Date   CREATININE 0.55 (L) 09/27/2022   BUN 9 09/27/2022   NA 140 09/27/2022   K 4.4 09/27/2022   CL 103 09/27/2022   CO2 22 09/27/2022   Lab Results  Component Value Date   ALT 24 09/27/2022   AST 17 09/27/2022   ALKPHOS 93 09/27/2022   BILITOT <0.2 09/27/2022   Lab Results  Component Value Date   HGBA1C 5.4 09/27/2022   HGBA1C 5.4 08/02/2012   Lab Results  Component Value Date   INSULIN 13.6 09/27/2022   Lab Results  Component Value Date   TSH 3.530 09/27/2022   Lab Results  Component Value Date   CHOL 222 (H) 09/27/2022   HDL 103 09/27/2022   LDLCALC 110 (H) 09/27/2022   TRIG 51 09/27/2022   CHOLHDL 2 08/01/2021   Lab Results  Component Value Date   VD25OH 20.2 (L) 09/27/2022   Lab Results  Component Value Date   WBC 6.1 09/27/2022   HGB 12.8 09/27/2022   HCT 40.9 09/27/2022   MCV 88 09/27/2022   PLT 285 09/27/2022   No results found for: "IRON", "TIBC",  "FERRITIN"  Attestation Statements:   Reviewed by clinician on day of visit: allergies, medications, problem list, medical history, surgical history, family history, social history, and previous encounter notes.  Time spent on visit including pre-visit chart review and post-visit care and charting was 40 minutes.   I, Burt Knack, am acting as transcriptionist for Quillian Quince, MD.  I have reviewed the above documentation for accuracy and completeness, and I agree with the above. -  ***

## 2022-11-06 ENCOUNTER — Ambulatory Visit: Payer: BC Managed Care – PPO | Admitting: Family

## 2022-11-06 VITALS — BP 125/71 | HR 82 | Temp 97.9°F | Resp 16 | Wt 251.0 lb

## 2022-11-06 DIAGNOSIS — G4700 Insomnia, unspecified: Secondary | ICD-10-CM | POA: Diagnosis not present

## 2022-11-06 DIAGNOSIS — G43009 Migraine without aura, not intractable, without status migrainosus: Secondary | ICD-10-CM

## 2022-11-06 DIAGNOSIS — F419 Anxiety disorder, unspecified: Secondary | ICD-10-CM

## 2022-11-06 DIAGNOSIS — F32A Depression, unspecified: Secondary | ICD-10-CM

## 2022-11-06 DIAGNOSIS — Z23 Encounter for immunization: Secondary | ICD-10-CM | POA: Diagnosis not present

## 2022-11-06 MED ORDER — AMITRIPTYLINE HCL 25 MG PO TABS: 25.0000 mg | ORAL_TABLET | Freq: Every day | ORAL | 2 refills | Status: DC

## 2022-11-06 NOTE — Assessment & Plan Note (Addendum)
  Persistent despite Lexapro 10mg  daily. Numbness on 20mg  Lexapro. Patient is working with a Paramedic and has started with a healthy weight and wellness program. Recent suicidal ideation reported to therapist, but no current plan. -Add Amitriptyline (Elavil) for potential benefit on depression, insomnia, and migraines. -Advised pt to call 911 if recurrent thought of hurting self. She verbalizes understanding -Refer to psychiatry for further medication adjustments (pt given numbers to call to schedule a new patient visit). -Continue therapy and wellness program. -Check back in 1 month.

## 2022-11-06 NOTE — Assessment & Plan Note (Signed)
  Increased frequency to at least once per week. Partial, temporary relief with Imitrex. -Add Amitriptyline (Elavil) for potential benefit on migraine prevention.

## 2022-11-06 NOTE — Patient Instructions (Addendum)
  VISIT SUMMARY:  During your recent visit, we discussed your ongoing issues with depression, migraines, and insomnia. You mentioned that you're feeling emotionally numb, despite a decrease in your Lexapro dosage. Your migraines have increased in frequency, and while Imitrex provides temporary relief, they return. Your sleep has improved, and you're maintaining an active lifestyle. You would like the the flu shot today, but declined one-time HIV and Hepatitis C screening.  YOUR PLAN:  -DEPRESSION: Your depression persists despite the current medication. We're adding a new medication, Amitriptyline, which may help with your depression, insomnia, and migraines. We're also referring you to a psychiatrist for further medication adjustments. Please continue with your therapy and wellness program. Here are some numbers you can try to establish with a psychiatrist:  Psychiatric Services:  Dr. Milagros Evener [facility]) - [phone] [facility] [place] [facility] [place]) [phone] [facility] [place]) [phone] [facility]) [phone]  -MIGRAINES: Your migraines have increased in frequency. We're adding Amitriptyline to your treatment plan, which may help prevent migraines.  -INSOMNIA: Your sleep has improved. We're adding Amitriptyline, which may further help with your insomnia.  -GENERAL HEALTH MAINTENANCE: We recommend getting the flu shot. You've declined one-time HIV and Hepatitis C screening, so we'll remove these from your to-do list.  INSTRUCTIONS:  Please start taking Amitriptyline as prescribed. Continue with your therapy and wellness program. We'll check back in a month to see how you're doing. If you have any concerns or if your symptoms worsen, please contact us immediately.

## 2022-11-06 NOTE — Progress Notes (Signed)
Subjective:     Patient ID: Kylie Ware, female    DOB: 08/22/1995, 27 y.o.   MRN: [id number]  Chief Complaint  Patient presents with   Depression    Here to follow up    Migraine    Patient complains of increasing migraine episodes      Discussed the use of AI scribe software for clinical note transcription with the patient, who gave verbal consent to proceed.  History of Present Illness   The patient, with a history of depression and migraines, presents for follow-up. She reports feeling 'not great' and notes to persistent emotional numbness despite a decrease in Lexapro from 20mg  to 10mg . She also reports a decrease in anxiety, stating she is 'past the point of caring about being anxious.' She is currently working with a therapist and has established care with a healthy weight and wellness center. She did have an episode about 2 weeks ago when she states she had thoughts of hurting herself with a plan.  She states that she addressed this with her therapist and is not currently having any suicidal ideations.   The patient's migraines have increased in frequency to 'at least once per week,' with a recent week of daily migraines. Imitrex provides temporary relief for 'maybe thirty minutes or an hour,' but the migraines return. She also reports improved sleep.  The patient is active, aiming for 7500 to 10000 steps daily. She has not received a flu shot this season and declined one-time HIV and Hepatitis C screening.          Health Maintenance Due  Topic Date Due   COVID-19 Vaccine (1 - 2023-24 season) Never done    Past Medical History:  Diagnosis Date   Ankle pain    Anxiety    Back pain    pt states d/t "slipped disc"   Back pain    Depression    Displaced bimalleolar fracture of left lower leg, initial encounter for closed fracture 05/15/2019   Displaced bimalleolar fracture of left lower leg, subsequent encounter for closed fracture with delayed healing  09/04/2019   Edema    Joint pain    Migraines    SOB (shortness of breath) on exertion     Past Surgical History:  Procedure Laterality Date   ORIF ANKLE FRACTURE Left 05/15/2019   Procedure: OPEN REDUCTION INTERNAL FIXATION (ORIF) LEFT BIMALLEOLAR ANKLE AND SYNDESMOSIS;  Surgeon: Tarry Kos, MD;  Location: [facility];  Service: Orthopedics;  Laterality: Left;   WISDOM TOOTH EXTRACTION      Family History  Problem Relation Age of Onset   Cancer Mother 66       breast   Hypertension Mother    Diabetes Father 52       type 32   Aortic stenosis Father 65       had AVR in his 50's   Depression Father    Hypertension Father    Anxiety disorder Father    Liver disease Father    Sleep apnea Father    Obesity Father    Osteogenesis imperfecta Brother    Depression Brother    Stroke Paternal Uncle    Heart failure Paternal Uncle    Cancer Maternal Grandmother 2       breast CA   Glaucoma Maternal Grandfather    Heart disease Maternal Grandfather        cad   Cancer Maternal Grandfather  unsure type   Alcoholism Paternal Grandmother    Emphysema Paternal Grandmother    CVA Paternal Grandfather    Dementia Paternal Grandfather     Social History   Socioeconomic History   Marital status: Single    Spouse name: Not on file   Number of children: Not on file   Years of education: Not on file   Highest education level: Bachelor's degree (e.g., BA, AB, BS)  Occupational History   Not on file  Tobacco Use   Smoking status: Never   Smokeless tobacco: Never  Vaping Use   Vaping status: Never Used  Substance and Sexual Activity   Alcohol use: No    Alcohol/week: 0.0 standard drinks of alcohol   Drug use: No   Sexual activity: Not Currently  Other Topics Concern   Not on file  Social History Narrative   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   Completed a bachelor's degree   Enjoys reading, video games, [place]    No pets   Social Determinants of Health   Financial Resource Strain: Medium Risk (05/29/2022)   Overall Financial Resource Strain (CARDIA)    Difficulty of Paying Living Expenses: Somewhat hard  Food Insecurity: Food Insecurity Present (05/29/2022)   Hunger Vital Sign    Worried About Running Out of Food in the Last Year: Sometimes true    Ran Out of Food in the Last Year: Never true  Transportation Needs: No Transportation Needs (05/29/2022)   PRAPARE - Administrator, Civil Service (Medical): No    Lack of Transportation (Non-Medical): No  Physical Activity: Insufficiently Active (05/29/2022)   Exercise Vital Sign    Days of Exercise per Week: 4 days    Minutes of Exercise per Session: 20 min  Stress: Stress Concern Present (05/29/2022)   [employer] of Occupational Health - Occupational Stress Questionnaire    Feeling of Stress : Rather much  Social Connections: Moderately Isolated (05/29/2022)   Social Connection and Isolation Panel [NHANES]    Frequency of Communication with Friends and Family: Once a week    Frequency of Social Gatherings with Friends and Family: Once a week    Attends Religious Services: More than 4 times per year    Active Member of [employer] or Organizations: Yes    Attends Banker Meetings: More than 4 times per year    Marital Status: Never married  Intimate Partner Violence: Unknown (05/23/2022)   Received from [employer], [facility]   HITS    Physically Hurt: Not on file    Insult or Talk Down To: Not on file    Threaten Physical Harm: Not on file    Scream or Curse: Not on file    Outpatient Medications Prior to Visit  Medication Sig Dispense Refill   escitalopram (LEXAPRO) 10 MG tablet Take 1 tablet (10 mg total) by mouth daily. 90 tablet 0   ibuprofen (ADVIL) 200 MG tablet Take 200 mg by mouth every 6 (six) hours as needed.     metFORMIN (GLUCOPHAGE) 500 MG tablet Take 1 tablet (500 mg total) by mouth daily with  breakfast. 30 tablet 0   SUMAtriptan (IMITREX) 50 MG tablet 1 TABLET BY MOUTH AT START OF MIGRAINE, MAY REPEAT IN 2 HOURS IF NEEDED (MAX 2 TABS/24 HRS) 9 tablet 6   Vitamin D, Ergocalciferol, (DRISDOL) 1.25 MG (50000 UNIT) CAPS capsule Take 1 capsule (50,000 Units total) by mouth  every 7 (seven) days. 5 capsule 0   No facility-administered medications prior to visit.    No Known Allergies  Review of Systems  Psychiatric/Behavioral:  Positive for depression.    See HPI    Objective:    Physical Exam Constitutional:      General: She is not in acute distress.    Appearance: Normal appearance. She is well-developed.  HENT:     Head: Normocephalic and atraumatic.     Right Ear: External ear normal.     Left Ear: External ear normal.  Eyes:     General: No scleral icterus. Neck:     Thyroid: No thyromegaly.  Cardiovascular:     Rate and Rhythm: Normal rate and regular rhythm.     Heart sounds: Normal heart sounds. No murmur heard. Pulmonary:     Effort: Pulmonary effort is normal. No respiratory distress.     Breath sounds: Normal breath sounds. No wheezing.  Musculoskeletal:     Cervical back: Neck supple.  Skin:    General: Skin is warm and dry.  Neurological:     Mental Status: She is alert and oriented to person, place, and time.  Psychiatric:        Mood and Affect: Affect is flat.        Speech: Speech normal.        Behavior: Behavior normal.        Thought Content: Thought content normal. Thought content does not include suicidal plan.        Cognition and Memory: Cognition normal.        Judgment: Judgment normal.      BP 125/71 (BP Location: Right Arm, Patient Position: Sitting, Cuff Size: Large)   Pulse 82   Temp 97.9 F (36.6 C) (Oral)   Resp 16   Wt 251 lb (113.9 kg)   SpO2 99%   BMI 39.91 kg/m  Wt Readings from Last 3 Encounters:  11/06/22 251 lb (113.9 kg)  10/30/22 248 lb (112.5 kg)  09/27/22 248 lb (112.5 kg)       Assessment & Plan:    Problem List Items Addressed This Visit       Unprioritized   Morbid obesity (HCC)    [facility] with [facility] and is following up regularly with them.      Migraine without aura and without status migrainosus, not intractable     Increased frequency to at least once per week. Partial, temporary relief with Imitrex. -Add Amitriptyline (Elavil) for potential benefit on migraine prevention.       Relevant Medications   amitriptyline (ELAVIL) 25 MG tablet   Insomnia     Improved. -Add Amitriptyline (Elavil) for potential benefit on insomnia.       Anxiety and depression     Persistent despite Lexapro 10mg  daily. Numbness on 20mg  Lexapro. Patient is working with a Paramedic and has started with a healthy weight and wellness program. Recent suicidal ideation reported to therapist, but no current plan. -Add Amitriptyline (Elavil) for potential benefit on depression, insomnia, and migraines. -Advised pt to call 911 if recurrent thought of hurting self. She verbalizes understanding -Refer to psychiatry for further medication adjustments (pt given numbers to call to schedule a new patient visit). -Continue therapy and wellness program. -Check back in 1 month.      Relevant Medications   amitriptyline (ELAVIL) 25 MG tablet   Other Visit Diagnoses     Needs flu shot    -  Primary   Relevant Orders   Flu vaccine trivalent PF, 6mos and older(Flulaval,Afluria,Fluarix,Fluzone) (Completed)       I am having Kylie Ware "Ellie" start on amitriptyline. I am also having her maintain her ibuprofen, escitalopram, SUMAtriptan, Vitamin D (Ergocalciferol), and metFORMIN.  Meds ordered this encounter  Medications   amitriptyline (ELAVIL) 25 MG tablet    Sig: Take 1 tablet (25 mg total) by mouth at bedtime.    Dispense:  30 tablet    Refill:  2    Order Specific Question:   Supervising Provider    Answer:   Danise Edge A [id number]

## 2022-11-06 NOTE — Assessment & Plan Note (Signed)
Recently established with [facility] and is following up regularly with them.

## 2022-11-06 NOTE — Assessment & Plan Note (Signed)
  Improved. -Add Amitriptyline (Elavil) for potential benefit on insomnia.

## 2022-11-08 ENCOUNTER — Ambulatory Visit (INDEPENDENT_AMBULATORY_CARE_PROVIDER_SITE_OTHER): Payer: BC Managed Care – PPO | Admitting: Family Medicine

## 2022-11-11 ENCOUNTER — Other Ambulatory Visit: Payer: Self-pay | Admitting: Family

## 2022-11-11 DIAGNOSIS — F419 Anxiety disorder, unspecified: Secondary | ICD-10-CM

## 2022-11-20 DIAGNOSIS — F321 Major depressive disorder, single episode, moderate: Secondary | ICD-10-CM | POA: Diagnosis not present

## 2022-11-20 DIAGNOSIS — F411 Generalized anxiety disorder: Secondary | ICD-10-CM | POA: Diagnosis not present

## 2022-11-22 ENCOUNTER — Ambulatory Visit (INDEPENDENT_AMBULATORY_CARE_PROVIDER_SITE_OTHER): Payer: BC Managed Care – PPO | Admitting: Family Medicine

## 2022-11-22 ENCOUNTER — Encounter (INDEPENDENT_AMBULATORY_CARE_PROVIDER_SITE_OTHER): Payer: Self-pay | Admitting: Family Medicine

## 2022-11-22 VITALS — BP 120/80 | HR 83 | Temp 98.1°F | Ht 66.5 in | Wt 250.0 lb

## 2022-11-22 DIAGNOSIS — E559 Vitamin D deficiency, unspecified: Secondary | ICD-10-CM

## 2022-11-22 DIAGNOSIS — E88819 Insulin resistance, unspecified: Secondary | ICD-10-CM

## 2022-11-22 DIAGNOSIS — Z6839 Body mass index (BMI) 39.0-39.9, adult: Secondary | ICD-10-CM | POA: Diagnosis not present

## 2022-11-22 DIAGNOSIS — E66812 Obesity, class 2: Secondary | ICD-10-CM

## 2022-11-22 MED ORDER — METFORMIN HCL 500 MG PO TABS
500.0000 mg | ORAL_TABLET | Freq: Every day | ORAL | 0 refills | Status: DC
Start: 2022-11-22 — End: 2023-01-11

## 2022-11-22 MED ORDER — VITAMIN D (ERGOCALCIFEROL) 1.25 MG (50000 UNIT) PO CAPS
50000.0000 [IU] | ORAL_CAPSULE | ORAL | 0 refills | Status: DC
Start: 2022-11-22 — End: 2023-01-11

## 2022-11-22 NOTE — Progress Notes (Signed)
Kylie Ware, D.O.  ABFM, ABOM Specializing in Clinical Bariatric Medicine  Office located at: [address] [place], [place]  [address]     Assessment and Plan:   Medications Discontinued During This Encounter  Medication Reason   Vitamin D, Ergocalciferol, (DRISDOL) 1.25 MG (50000 UNIT) CAPS capsule Reorder   metFORMIN (GLUCOPHAGE) 500 MG tablet Reorder    Meds ordered this encounter  Medications   Vitamin D, Ergocalciferol, (DRISDOL) 1.25 MG (50000 UNIT) CAPS capsule    Sig: Take 1 capsule (50,000 Units total) by mouth every 7 (seven) days.    Dispense:  5 capsule    Refill:  0   metFORMIN (GLUCOPHAGE) 500 MG tablet    Sig: Take 1 tablet (500 mg total) by mouth daily with breakfast.    Dispense:  30 tablet    Refill:  0     Insulin resistance At last visit with Dr. Dalbert Garnet pt was started on Metformin and vitamin D on 10/30/22. Taking Metformin with breakfast before leaving for work. Occasionally will forget to take on her way out. Endorses nausea with Metformin, but states taking it with food has helped. She is unsure if Metformin has helped with hunger or cravings. Educated pt of sufficient protein intake helping to stabilize blood glucose levels. Advised pt to consider taking Metformin at lunch or afternoons to avoid getting afternoon hunger and cravings. Provided thorough counseling on meal prepping options and journaling resources (apps on phone and handout).  Changed meal plan to officially transition her to journaling to track her daily calorie and protein intake. Goal of 1100-1200 calories and 90+ g of protein. We will continue to monitor her condition as it relates to her weight loss. Will refill Metformin 500 mg daily.   Vitamin D deficiency Assessment & Plan: Lab Results  Component Value Date   VD25OH 20.2 (L) 09/27/2022   Pt is compliant with ERGO 50K units once weekly, every Saturday morning. No intolerances or negative side effects reported. Continue  with current medication/treatment regimen per PCP. Will continue to monitor her vitamin D levels as it pertains to her weight loss journey. Will refill ERGO 50K units today.    BMI 39.0-39.9,adult, current 39.75 Morbid obesity (HCC) starting bmi 09/27/22- 39.75 Assessment & Plan: Kylie Ware is here to discuss her progress with her obesity treatment plan along with follow-up of her obesity related diagnoses. See Medical Weight Management Flowsheet for complete bioelectrical impedance results.  Since last office visit on 10/30/22 patient's muscle mass has increased by 1.4 lbs. Fat mass has increased by 1.2 lbs. Total body water has decreased by 1.8 lbs.  Counseling done on how various foods will affect these numbers and how to maximize success  Total lbs lost to date: -2 lbs Total weight loss percentage to date: 0.81 %   *** Meal plan change: Start journaling at 1400-1500 calories and 115+ g of protein daily.   Behavioral Intervention Additional resources provided today: Pension scheme manager, Recipes, and [employer] weekly menu. Evidence-based interventions for health behavior change were utilized today including the discussion of self monitoring techniques, problem-solving barriers and SMART goal setting techniques.   Regarding patient's less desirable eating habits and patterns, we employed the technique of small changes.  Pt will specifically work on: [employer] and bring log for next visit.    FOLLOW UP: Return in about 2 weeks (around 12/06/2022).  She was informed of the importance of frequent follow up visits to  maximize her success with intensive lifestyle modifications for her multiple health conditions.  Subjective:   Chief complaint: Obesity Kylie Ware is here to discuss her progress with her obesity treatment plan. She is on the Category 2 Plan and states she is following her eating plan approximately 75% of the time. She states she is not  exercising.  Interval History:  Kylie Ware is here for a follow up office visit. Since last OV, she states she and her roommate have been meal planning together. Reports camping a few weeks ago with family and wasn't follow meal plan while on this trip. For breakfast typically has a protein shake with added protein powder (350 calories and 40 g of protein). States she has been journaling on her own and has been tracking 1900-2000 calories a day. Cravings are occasional and tend to vary. Recently she tends to crave rice cakes. Drinks at least 64 ounces of water a day. Has a 32 ounce water bottle and refills twice.   Pharmacotherapy for weight loss: She is currently taking  Metformin 500 mg  for medical weight loss.  Denies side effects.    Review of Systems:  Pertinent positives were addressed with patient today.  Reviewed by clinician on day of visit: allergies, medications, problem list, medical history, surgical history, family history, social history, and previous encounter notes.  Weight Summary and Biometrics   Weight Lost Since Last Visit: 0lb  Weight Gained Since Last Visit: 2lb   Vitals Temp: 98.1 F (36.7 C) BP: 120/80 Pulse Rate: 83 SpO2: 99 %   Anthropometric Measurements Height: 5' 6.5" (1.689 m) Weight: 250 lb (113.4 kg) BMI (Calculated): 39.75 Weight at Last Visit: 248lb Weight Lost Since Last Visit: 0lb Weight Gained Since Last Visit: 2lb Starting Weight: 248lb Total Weight Loss (lbs): 0 lb (0 kg) Peak Weight: 281lb   Body Composition  Body Fat %: 47.2 % Fat Mass (lbs): 118.2 lbs Muscle Mass (lbs): 125.8 lbs Total Body Water (lbs): 95.8 lbs Visceral Fat Rating : 11   Other Clinical Data Fasting: no Labs: no Today's Visit #: 3 Starting Date: 09/27/22    Objective:   PHYSICAL EXAM: Blood pressure 120/80, pulse 83, temperature 98.1 F (36.7 C), height 5' 6.5" (1.689 m), weight 250 lb (113.4 kg), SpO2 99%. Body mass index is 39.75  kg/m.  General: Well Developed, well nourished, and in no acute distress.  HEENT: Normocephalic, atraumatic Skin: Warm and dry, cap RF less 2 sec, good turgor Chest:  Normal excursion, shape, no gross abn Respiratory: speaking in full sentences, no conversational dyspnea NeuroM-Sk: Ambulates w/o assistance, moves * 4 Psych: A and O *3, insight good, mood-full  DIAGNOSTIC DATA REVIEWED:  BMET    Component Value Date/Time   NA 140 09/27/2022 0853   K 4.4 09/27/2022 0853   CL 103 09/27/2022 0853   CO2 22 09/27/2022 0853   GLUCOSE 86 09/27/2022 0853   GLUCOSE 84 06/06/2022 1020   BUN 9 09/27/2022 0853   CREATININE 0.55 (L) 09/27/2022 0853   CREATININE 0.60 08/02/2012 1440   CALCIUM 9.3 09/27/2022 0853   Lab Results  Component Value Date   HGBA1C 5.4 09/27/2022   HGBA1C 5.4 08/02/2012   Lab Results  Component Value Date   INSULIN 13.6 09/27/2022   Lab Results  Component Value Date   TSH 3.530 09/27/2022   CBC    Component Value Date/Time   WBC 6.1 09/27/2022 0853   WBC 7.9 08/04/2022 0732   RBC 4.66 09/27/2022 0853  RBC 4.42 08/04/2022 0732   HGB 12.8 09/27/2022 0853   HCT 40.9 09/27/2022 0853   PLT 285 09/27/2022 0853   MCV 88 09/27/2022 0853   MCH 27.5 09/27/2022 0853   MCHC 31.3 (L) 09/27/2022 0853   MCHC 32.5 08/04/2022 0732   RDW 12.4 09/27/2022 0853   Iron Studies No results found for: "IRON", "TIBC", "FERRITIN", "IRONPCTSAT" Lipid Panel     Component Value Date/Time   CHOL 222 (H) 09/27/2022 0853   TRIG 51 09/27/2022 0853   HDL 103 09/27/2022 0853   CHOLHDL 2 08/01/2021 0950   VLDL 10.8 08/01/2021 0950   LDLCALC 110 (H) 09/27/2022 0853   Hepatic Function Panel     Component Value Date/Time   PROT 6.7 09/27/2022 0853   ALBUMIN 4.3 09/27/2022 0853   AST 17 09/27/2022 0853   ALT 24 09/27/2022 0853   ALKPHOS 93 09/27/2022 0853   BILITOT <0.2 09/27/2022 0853      Component Value Date/Time   TSH 3.530 09/27/2022 0853   Nutritional Lab  Results  Component Value Date   VD25OH 20.2 (L) 09/27/2022    Attestations:   Burnett Sheng, acting as a medical scribe for Thomasene Lot, DO., have compiled all relevant documentation for today's office visit on behalf of Thomasene Lot, DO, while in the presence of [employer], DO.  I have reviewed the above documentation for accuracy and completeness, and I agree with the above. Kylie Ware, D.O.  The 21st Century Cures Act was signed into law in 2016 which includes the topic of electronic health records.  This provides immediate access to information in MyChart.  This includes consultation notes, operative notes, office notes, lab results and pathology reports.  If you have any questions about what you read please let us know at your next visit so we can discuss your concerns and take corrective action if need be.  We are right here with you.

## 2022-12-02 ENCOUNTER — Other Ambulatory Visit: Payer: Self-pay | Admitting: Family

## 2022-12-04 ENCOUNTER — Ambulatory Visit: Payer: BC Managed Care – PPO | Admitting: Family

## 2022-12-04 VITALS — BP 109/72 | HR 86 | Temp 97.6°F | Resp 16 | Ht 66.5 in | Wt 252.0 lb

## 2022-12-04 DIAGNOSIS — F32A Depression, unspecified: Secondary | ICD-10-CM

## 2022-12-04 DIAGNOSIS — F419 Anxiety disorder, unspecified: Secondary | ICD-10-CM | POA: Diagnosis not present

## 2022-12-04 DIAGNOSIS — E559 Vitamin D deficiency, unspecified: Secondary | ICD-10-CM

## 2022-12-04 DIAGNOSIS — G43009 Migraine without aura, not intractable, without status migrainosus: Secondary | ICD-10-CM | POA: Diagnosis not present

## 2022-12-04 DIAGNOSIS — G4700 Insomnia, unspecified: Secondary | ICD-10-CM

## 2022-12-04 MED ORDER — AMITRIPTYLINE HCL 25 MG PO TABS: 50.0000 mg | ORAL_TABLET | Freq: Every evening | ORAL | 0 refills | Status: DC | PRN

## 2022-12-04 NOTE — Assessment & Plan Note (Addendum)
Continue on Lexapro 10mg  daily.  Continue amitryptyline as it is improving mood. Will increase from 25mg  to 50mg  though due to insomnia issues.

## 2022-12-04 NOTE — Assessment & Plan Note (Signed)
Uncontrolled. Increase amitriptyline from 25mg  to 50mg .

## 2022-12-04 NOTE — Progress Notes (Signed)
Subjective:     Patient ID: Kylie Ware, female    DOB: November 11, 1995, 27 y.o.   MRN: [id number]  Chief Complaint  Patient presents with   Depression    Here for follow up    Depression         Discussed the use of AI scribe software for clinical note transcription with the patient, who gave verbal consent to proceed.  History of Present Illness         Patient is a 27 yr old female who presents today for follow up.  Last visit we added Elavil to her regimen of lexapro 10mg .  We added this medication to help improve her her insomnia, mood and migraines. She reports improvement in her mood.  Initially she noted improvement in her insomnia, but now she feels that she is having more trouble falling and staying asleep.  She notes significant improvement in her migraines.  She has not had a full blown migraine since prior to last visit.     Health Maintenance Due  Topic Date Due   COVID-19 Vaccine (1 - 2023-24 season) Never done    Past Medical History:  Diagnosis Date   Ankle pain    Anxiety    Back pain    pt states d/t "slipped disc"   Back pain    Depression    Displaced bimalleolar fracture of left lower leg, initial encounter for closed fracture 05/15/2019   Displaced bimalleolar fracture of left lower leg, subsequent encounter for closed fracture with delayed healing 09/04/2019   Edema    Joint pain    Migraines    SOB (shortness of breath) on exertion     Past Surgical History:  Procedure Laterality Date   ORIF ANKLE FRACTURE Left 05/15/2019   Procedure: OPEN REDUCTION INTERNAL FIXATION (ORIF) LEFT BIMALLEOLAR ANKLE AND SYNDESMOSIS;  Surgeon: Tarry Kos, MD;  Location: [facility];  Service: Orthopedics;  Laterality: Left;   WISDOM TOOTH EXTRACTION      Family History  Problem Relation Age of Onset   Cancer Mother 1       breast   Hypertension Mother    Diabetes Father 6       type 22   Aortic stenosis Father 68       had AVR in  his 19's   Depression Father    Hypertension Father    Anxiety disorder Father    Liver disease Father    Sleep apnea Father    Obesity Father    Osteogenesis imperfecta Brother    Depression Brother    Stroke Paternal Uncle    Heart failure Paternal Uncle    Cancer Maternal Grandmother 60       breast CA   Glaucoma Maternal Grandfather    Heart disease Maternal Grandfather        cad   Cancer Maternal Grandfather        unsure type   Alcoholism Paternal Grandmother    Emphysema Paternal Grandmother    CVA Paternal Grandfather    Dementia Paternal Grandfather     Social History   Socioeconomic History   Marital status: Single    Spouse name: Not on file   Number of children: Not on file   Years of education: Not on file   Highest education level: Bachelor's degree (e.g., BA, AB, BS)  Occupational History   Not on file  Tobacco Use   Smoking status: Never   Smokeless  tobacco: Never  Vaping Use   Vaping status: Never Used  Substance and Sexual Activity   Alcohol use: No    Alcohol/week: 0.0 standard drinks of alcohol   Drug use: No   Sexual activity: Not Currently  Other Topics Concern   Not on file  Social History Narrative   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   Completed a bachelor's degree   Enjoys reading, video games, walks [place]   No pets   Social Determinants of Health   Financial Resource Strain: Low Risk  (12/01/2022)   Overall Financial Resource Strain (CARDIA)    Difficulty of Paying Living Expenses: Not very hard  Food Insecurity: No Food Insecurity (12/01/2022)   Hunger Vital Sign    Worried About Running Out of Food in the Last Year: Never true    Ran Out of Food in the Last Year: Never true  Transportation Needs: No Transportation Needs (12/01/2022)   PRAPARE - Administrator, Civil Service (Medical): No    Lack of Transportation (Non-Medical): No  Physical Activity: Insufficiently Active  (12/01/2022)   Exercise Vital Sign    Days of Exercise per Week: 3 days    Minutes of Exercise per Session: 10 min  Stress: Stress Concern Present (12/01/2022)   [employer] of Occupational Health - Occupational Stress Questionnaire    Feeling of Stress : Rather much  Social Connections: Moderately Integrated (12/01/2022)   Social Connection and Isolation Panel [NHANES]    Frequency of Communication with Friends and Family: Once a week    Frequency of Social Gatherings with Friends and Family: Twice a week    Attends Religious Services: More than 4 times per year    Active Member of [employer] or Organizations: Yes    Attends Banker Meetings: More than 4 times per year    Marital Status: Never married  Intimate Partner Violence: Unknown (05/23/2022)   Received from [employer], [facility]   HITS    Physically Hurt: Not on file    Insult or Talk Down To: Not on file    Threaten Physical Harm: Not on file    Scream or Curse: Not on file    Outpatient Medications Prior to Visit  Medication Sig Dispense Refill   escitalopram (LEXAPRO) 10 MG tablet TAKE 1 TABLET BY MOUTH EVERY DAY 90 tablet 0   ibuprofen (ADVIL) 200 MG tablet Take 200 mg by mouth every 6 (six) hours as needed.     metFORMIN (GLUCOPHAGE) 500 MG tablet Take 1 tablet (500 mg total) by mouth daily with breakfast. 30 tablet 0   SUMAtriptan (IMITREX) 50 MG tablet 1 TABLET BY MOUTH AT START OF MIGRAINE, MAY REPEAT IN 2 HOURS IF NEEDED (MAX 2 TABS/24 HRS) 9 tablet 6   Vitamin D, Ergocalciferol, (DRISDOL) 1.25 MG (50000 UNIT) CAPS capsule Take 1 capsule (50,000 Units total) by mouth every 7 (seven) days. 5 capsule 0   amitriptyline (ELAVIL) 25 MG tablet TAKE 1 TABLET BY MOUTH EVERYDAY AT BEDTIME 90 tablet 1   No facility-administered medications prior to visit.    No Known Allergies  Review of Systems  Psychiatric/Behavioral:  Positive for depression.    See HPI    Objective:    Physical  Exam Constitutional:      General: She is not in acute distress.    Appearance: Normal appearance. She is well-developed.  HENT:     Head: Normocephalic and  atraumatic.     Right Ear: External ear normal.     Left Ear: External ear normal.  Eyes:     General: No scleral icterus. Neck:     Thyroid: No thyromegaly.  Cardiovascular:     Rate and Rhythm: Normal rate and regular rhythm.     Heart sounds: Normal heart sounds. No murmur heard. Pulmonary:     Effort: Pulmonary effort is normal. No respiratory distress.     Breath sounds: Normal breath sounds. No wheezing.  Musculoskeletal:     Cervical back: Neck supple.  Skin:    General: Skin is warm and dry.  Neurological:     Mental Status: She is alert and oriented to person, place, and time.  Psychiatric:        Mood and Affect: Mood normal.        Behavior: Behavior normal.        Thought Content: Thought content normal.        Judgment: Judgment normal.      BP 109/72 (BP Location: Right Arm, Patient Position: Sitting, Cuff Size: Large)   Pulse 86   Temp 97.6 F (36.4 C) (Oral)   Resp 16   Ht 5' 6.5" (1.689 m)   Wt 252 lb (114.3 kg)   SpO2 98%   BMI 40.06 kg/m  Wt Readings from Last 3 Encounters:  12/04/22 252 lb (114.3 kg)  11/22/22 250 lb (113.4 kg)  11/06/22 251 lb (113.9 kg)       Assessment & Plan:   Problem List Items Addressed This Visit       Unprioritized   Vitamin D deficiency - Primary    Continue on the vitamin D supplement.       Migraine without aura and without status migrainosus, not intractable    Continue with the Imitrex and Amitriptyline for migraines      Relevant Medications   amitriptyline (ELAVIL) 25 MG tablet   Insomnia    Uncontrolled. Increase amitriptyline from 25mg  to 50mg .       Anxiety and depression    Continue on Lexapro 10mg  daily.  Continue amitryptyline as it is improving mood. Will increase from 25mg  to 50mg  though due to insomnia issues.       Relevant  Medications   amitriptyline (ELAVIL) 25 MG tablet    I have changed Kylie Ware "Ellie"'s amitriptyline. I am also having her maintain her ibuprofen, SUMAtriptan, escitalopram, Vitamin D (Ergocalciferol), and metFORMIN.  Meds ordered this encounter  Medications   amitriptyline (ELAVIL) 25 MG tablet    Sig: Take 2 tablets (50 mg total) by mouth at bedtime as needed for sleep.    Dispense:  180 tablet    Refill:  0    Order Specific Question:   Supervising Provider    Answer:   Danise Edge A [[id number]]

## 2022-12-04 NOTE — Assessment & Plan Note (Signed)
Continue on the vitamin D supplement.

## 2022-12-04 NOTE — Assessment & Plan Note (Signed)
Continue with the Imitrex and Amitriptyline for migraines

## 2022-12-04 NOTE — Progress Notes (Signed)
Subjective:     Patient ID: Kylie Ware, female    DOB: 12-28-1995, 27 y.o.   MRN: [id number]  Chief Complaint  Patient presents with   Depression    Here for follow up    Depression        Associated symptoms include headaches.   Discussed the use of AI scribe software for clinical note transcription with the patient, who gave verbal consent to proceed.   Patient is a 27 year old female with a history of depression and migraines is here today for follow up since adding Amitriptyline in September. She states that since decreasing Lexapro to 10mg  due to feeling emotionally numb and withdrawn she is feeling a lot better than the 20mg  dose. Added Amitriptyline 25 mg in September   Patient states that she is doing well with her mood and she is able to control her mood swings more effectively she feels like now. She states she is no longer having anger fits and intense mood swings that she has noticed with the Amitriptyline and the Lexapro. Patient states that she is still working with her therapist with her weight and wellness program.   Patent states that with the amitriptyline it is helping really well with her migraines. She states that today is the first day that she has woken up with a slight headaches   As far as her sleeping and insomnia patient states that the first 2 weeks being on the medication she could go to sleep easily. But now she states she is having issues going to sleep and falling asleep. Patient states that she has tried melatonin in the past but that did not help with her sleep at all.    Paitent is doing well with her viatamin D supplement  Health Maintenance Due  Topic Date Due   COVID-19 Vaccine (1 - 2023-24 season) Never done    Past Medical History:  Diagnosis Date   Ankle pain    Anxiety    Back pain    pt states d/t "slipped disc"   Back pain    Depression    Displaced bimalleolar fracture of left lower leg, initial encounter for closed  fracture 05/15/2019   Displaced bimalleolar fracture of left lower leg, subsequent encounter for closed fracture with delayed healing 09/04/2019   Edema    Joint pain    Migraines    SOB (shortness of breath) on exertion     Past Surgical History:  Procedure Laterality Date   ORIF ANKLE FRACTURE Left 05/15/2019   Procedure: OPEN REDUCTION INTERNAL FIXATION (ORIF) LEFT BIMALLEOLAR ANKLE AND SYNDESMOSIS;  Surgeon: Tarry Kos, MD;  Location: [facility];  Service: Orthopedics;  Laterality: Left;   WISDOM TOOTH EXTRACTION      Family History  Problem Relation Age of Onset   Cancer Mother 23       breast   Hypertension Mother    Diabetes Father 4       type 65   Aortic stenosis Father 2       had AVR in his 16's   Depression Father    Hypertension Father    Anxiety disorder Father    Liver disease Father    Sleep apnea Father    Obesity Father    Osteogenesis imperfecta Brother    Depression Brother    Stroke Paternal Uncle    Heart failure Paternal Uncle    Cancer Maternal Grandmother 83  breast CA   Glaucoma Maternal Grandfather    Heart disease Maternal Grandfather        cad   Cancer Maternal Grandfather        unsure type   Alcoholism Paternal Grandmother    Emphysema Paternal Grandmother    CVA Paternal Grandfather    Dementia Paternal Grandfather     Social History   Socioeconomic History   Marital status: Single    Spouse name: Not on file   Number of children: Not on file   Years of education: Not on file   Highest education level: Bachelor's degree (e.g., BA, AB, BS)  Occupational History   Not on file  Tobacco Use   Smoking status: Never   Smokeless tobacco: Never  Vaping Use   Vaping status: Never Used  Substance and Sexual Activity   Alcohol use: No    Alcohol/week: 0.0 standard drinks of alcohol   Drug use: No   Sexual activity: Not Currently  Other Topics Concern   Not on file  Social History Narrative   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   Completed a bachelor's degree   Enjoys reading, video games, walks on the [place]   No pets   Social Determinants of Health   Financial Resource Strain: Low Risk  (12/01/2022)   Overall Financial Resource Strain (CARDIA)    Difficulty of Paying Living Expenses: Not very hard  Food Insecurity: No Food Insecurity (12/01/2022)   Hunger Vital Sign    Worried About Running Out of Food in the Last Year: Never true    Ran Out of Food in the Last Year: Never true  Transportation Needs: No Transportation Needs (12/01/2022)   PRAPARE - Administrator, Civil Service (Medical): No    Lack of Transportation (Non-Medical): No  Physical Activity: Insufficiently Active (12/01/2022)   Exercise Vital Sign    Days of Exercise per Week: 3 days    Minutes of Exercise per Session: 10 min  Stress: Stress Concern Present (12/01/2022)   [employer] of Occupational Health - Occupational Stress Questionnaire    Feeling of Stress : Rather much  Social Connections: Moderately Integrated (12/01/2022)   Social Connection and Isolation Panel [NHANES]    Frequency of Communication with Friends and Family: Once a week    Frequency of Social Gatherings with Friends and Family: Twice a week    Attends Religious Services: More than 4 times per year    Active Member of [employer] or Organizations: Yes    Attends Banker Meetings: More than 4 times per year    Marital Status: Never married  Intimate Partner Violence: Unknown (05/23/2022)   Received from [employer] [facility], [facility]   HITS    Physically Hurt: Not on file    Insult or Talk Down To: Not on file    Threaten Physical Harm: Not on file    Scream or Curse: Not on file    Outpatient Medications Prior to Visit  Medication Sig Dispense Refill   amitriptyline (ELAVIL) 25 MG tablet TAKE 1 TABLET BY MOUTH EVERYDAY AT BEDTIME 90 tablet 1   escitalopram (LEXAPRO) 10 MG tablet TAKE 1  TABLET BY MOUTH EVERY DAY 90 tablet 0   ibuprofen (ADVIL) 200 MG tablet Take 200 mg by mouth every 6 (six) hours as needed.     metFORMIN (GLUCOPHAGE) 500 MG tablet Take 1 tablet (500 mg total) by mouth daily with breakfast.  30 tablet 0   SUMAtriptan (IMITREX) 50 MG tablet 1 TABLET BY MOUTH AT START OF MIGRAINE, MAY REPEAT IN 2 HOURS IF NEEDED (MAX 2 TABS/24 HRS) 9 tablet 6   Vitamin D, Ergocalciferol, (DRISDOL) 1.25 MG (50000 UNIT) CAPS capsule Take 1 capsule (50,000 Units total) by mouth every 7 (seven) days. 5 capsule 0   No facility-administered medications prior to visit.    No Known Allergies  Review of Systems  Constitutional:  Negative for chills, fever and weight loss.  HENT:  Negative for congestion and sore throat.   Respiratory:  Negative for cough, shortness of breath and wheezing.   Cardiovascular:  Negative for chest pain and palpitations.  Gastrointestinal:  Negative for abdominal pain, nausea and vomiting.  Genitourinary:  Negative for urgency.  Musculoskeletal:  Negative for back pain.  Skin:  Negative for itching and rash.  Neurological:  Positive for headaches. Negative for dizziness.  Psychiatric/Behavioral:  Positive for depression.        Objective:    Physical Exam Vitals reviewed.  Constitutional:      Appearance: Normal appearance.  HENT:     Head: Normocephalic.  Cardiovascular:     Rate and Rhythm: Normal rate and regular rhythm.     Pulses: Normal pulses.     Heart sounds: Normal heart sounds.  Pulmonary:     Effort: Pulmonary effort is normal.     Breath sounds: Normal breath sounds.  Musculoskeletal:        General: Normal range of motion.     Cervical back: Normal range of motion.  Skin:    General: Skin is warm.  Neurological:     General: No focal deficit present.     Mental Status: She is alert and oriented to person, place, and time. Mental status is at baseline.  Psychiatric:        Mood and Affect: Mood normal.        Behavior:  Behavior normal.        Thought Content: Thought content normal.        Judgment: Judgment normal.      BP 109/72 (BP Location: Right Arm, Patient Position: Sitting, Cuff Size: Large)   Pulse 86   Temp 97.6 F (36.4 C) (Oral)   Resp 16   Ht 5' 6.5" (1.689 m)   Wt 252 lb (114.3 kg)   SpO2 98%   BMI 40.06 kg/m  Wt Readings from Last 3 Encounters:  12/04/22 252 lb (114.3 kg)  11/22/22 250 lb (113.4 kg)  11/06/22 251 lb (113.9 kg)       Assessment & Plan:   Problem List Items Addressed This Visit   None   I am having Kylie Ware "Kylie Ware" maintain her ibuprofen, SUMAtriptan, escitalopram, Vitamin D (Ergocalciferol), metFORMIN, and amitriptyline.  No orders of the defined types were placed in this encounter.

## 2022-12-04 NOTE — Patient Instructions (Addendum)
Please increase your amitriptyline from 25mg  to 50mg  daily.  Send me an update in a few weeks to let me know how you are doing on the new dose.

## 2022-12-06 ENCOUNTER — Ambulatory Visit (INDEPENDENT_AMBULATORY_CARE_PROVIDER_SITE_OTHER): Payer: BC Managed Care – PPO | Admitting: Family Medicine

## 2022-12-06 ENCOUNTER — Encounter (INDEPENDENT_AMBULATORY_CARE_PROVIDER_SITE_OTHER): Payer: Self-pay | Admitting: Family Medicine

## 2022-12-06 VITALS — BP 112/79 | HR 89 | Temp 97.8°F | Ht 66.5 in | Wt 247.0 lb

## 2022-12-06 DIAGNOSIS — Z6839 Body mass index (BMI) 39.0-39.9, adult: Secondary | ICD-10-CM

## 2022-12-06 DIAGNOSIS — E669 Obesity, unspecified: Secondary | ICD-10-CM | POA: Diagnosis not present

## 2022-12-06 DIAGNOSIS — E6609 Other obesity due to excess calories: Secondary | ICD-10-CM

## 2022-12-06 DIAGNOSIS — E559 Vitamin D deficiency, unspecified: Secondary | ICD-10-CM | POA: Diagnosis not present

## 2022-12-06 DIAGNOSIS — E88819 Insulin resistance, unspecified: Secondary | ICD-10-CM | POA: Diagnosis not present

## 2022-12-06 NOTE — Progress Notes (Signed)
Kylie Ware, D.O.  ABFM, ABOM Specializing in Clinical Bariatric Medicine  Office located at: [address] [place], [place]  [address]     Assessment and Plan:   Insulin resistance Assessment & Plan: Pt is taking Metformin 500 mg daily with breakfast. Tolerating well with no side effects reported. Reports Metformin has been overall helpful. We discussed the option of increasing her dose to twice daily if needed in the future. She declined increasing her dose at the moment. I encouraged her to follow her meal plan by continuing to meet her protein intake goal and stay within her caloric intake range. Continue on current medication regimen.    Vitamin D deficiency Assessment & Plan: Lab Results  Component Value Date   VD25OH 20.2 (L) 09/27/2022   Kylie Ware is taking ERGO 50K units once weekly. Tolerating well with no side effects. Continue with current regimen unless advised otherwise. We will continue to monitor levels regularly, on average every 3-4 between labs, to keep keep levels within normal limits and prevent over supplementation.   Starting bmi 09/27/22- 39.75 BMI 39.0-39.9,adult, current 39.27 Assessment & Plan: Kylie Ware is here to discuss her progress with her obesity treatment plan along with follow-up of her obesity related diagnoses. See Medical Weight Management Flowsheet for complete bioelectrical impedance results.  Kylie Ware reports eating the same foods for different meals. She has found journaling to be very helpful and has been able to better keep track of her progress with daily calorie and protein intake. She currently has a therapist who has helped with her emotional eating.   Counseled patient on eating variation of foods a day rather than eating the same meals repetitively. Educated pt on emotional and mindful eating. I presented Dr. Dewaine Conger as an additional resource to her therapist if ever needed. Reviewed the benefits of journaling as  it pertains to mindful eating and visualizing trends for meeting caloric intake. I recommend she continue to adhere to journaling her daily caloric and protein goals.   Since last office visit on 11/22/22 patient's muscle mass has increased by 1 lbs. Fat mass has decreased by 4.4 lbs. Total body water has decreased by 1.8 lbs.  Counseling done on how various foods will affect these numbers and how to maximize success  Total lbs lost to date: 1 lb Total weight loss percentage to date: -0.40 %  No change to meal plan - see Subjective  Behavioral Intervention Additional resources provided today: Food journaling plan information and mindful eating habits Evidence-based interventions for health behavior change were utilized today including the discussion of self monitoring techniques, problem-solving barriers and SMART goal setting techniques.   Regarding patient's less desirable eating habits and patterns, we employed the technique of small changes.  Pt will specifically work on: Continue to journal her daily caloric and protein intake for next visit.    FOLLOW UP: Return in about 15 days (around 12/21/2022). She was informed of the importance of frequent follow up visits to maximize her success with intensive lifestyle modifications for her multiple health conditions.  Subjective:   Chief complaint: Obesity Kylie Ware is here to discuss her progress with her obesity treatment plan. She is keeping a food journal and adhering to recommended goals of 1400-1500  calories and 115+ g of  protein and states she is following her eating plan approximately 90-95% of the time. She states she is exercising 60 minutes 2-3 days per week.  Interval History:  Kylie Ware is here  for a follow up office visit. Since last OV, she has been following her meal plan well but at times struggled with staying within her daily caloric intake range for dinner. She has been meeting her protein intake goal. Continues  to meal prep with her roommate. Journaling has been helpful to her, she has noticed she tends to eat the same thing often. Usually brings in breakfast and lunch to work. Every now and then she will crave something sweet. Hunger is well controlled, she doesn't get hungry until after work as she is preparing dinner.   Pharmacotherapy for weight loss: She is currently taking  Metformin  for medical weight loss.  Denies side effects.    Review of Systems:  Pertinent positives were addressed with patient today.  Reviewed by clinician on day of visit: allergies, medications, problem list, medical history, surgical history, family history, social history, and previous encounter notes.  Weight Summary and Biometrics   Weight Lost Since Last Visit: 3lb  Weight Gained Since Last Visit: 0lb   Vitals Temp: 97.8 F (36.6 C) BP: 112/79 Pulse Rate: 89 SpO2: 99 %   Anthropometric Measurements Height: 5' 6.5" (1.689 m) Weight: 247 lb (112 kg) BMI (Calculated): 39.27 Weight at Last Visit: 250lb Weight Lost Since Last Visit: 3lb Weight Gained Since Last Visit: 0lb Starting Weight: 248lb Total Weight Loss (lbs): 1 lb (0.454 kg) Peak Weight: 281lb   Body Composition  Body Fat %: 46 % Fat Mass (lbs): 113.8 lbs Muscle Mass (lbs): 126.8 lbs Total Body Water (lbs): 94 lbs Visceral Fat Rating : 11   Other Clinical Data Fasting: no Labs: no Today's Visit #: 4 Starting Date: 09/27/22    Objective:   PHYSICAL EXAM: Blood pressure 112/79, pulse 89, temperature 97.8 F (36.6 C), height 5' 6.5" (1.689 m), weight 247 lb (112 kg), SpO2 99%. Body mass index is 39.27 kg/m.  General: Well Developed, well nourished, and in no acute distress.  HEENT: Normocephalic, atraumatic Skin: Warm and dry, cap RF less 2 sec, good turgor Chest:  Normal excursion, shape, no gross abn Respiratory: speaking in full sentences, no conversational dyspnea NeuroM-Sk: Ambulates w/o assistance, moves * 4 Psych:  A and O *3, insight good, mood-full  DIAGNOSTIC DATA REVIEWED:  BMET    Component Value Date/Time   NA 140 09/27/2022 0853   K 4.4 09/27/2022 0853   CL 103 09/27/2022 0853   CO2 22 09/27/2022 0853   GLUCOSE 86 09/27/2022 0853   GLUCOSE 84 06/06/2022 1020   BUN 9 09/27/2022 0853   CREATININE 0.55 (L) 09/27/2022 0853   CREATININE 0.60 08/02/2012 1440   CALCIUM 9.3 09/27/2022 0853   Lab Results  Component Value Date   HGBA1C 5.4 09/27/2022   HGBA1C 5.4 08/02/2012   Lab Results  Component Value Date   INSULIN 13.6 09/27/2022   Lab Results  Component Value Date   TSH 3.530 09/27/2022   CBC    Component Value Date/Time   WBC 6.1 09/27/2022 0853   WBC 7.9 08/04/2022 0732   RBC 4.66 09/27/2022 0853   RBC 4.42 08/04/2022 0732   HGB 12.8 09/27/2022 0853   HCT 40.9 09/27/2022 0853   PLT 285 09/27/2022 0853   MCV 88 09/27/2022 0853   MCH 27.5 09/27/2022 0853   MCHC 31.3 (L) 09/27/2022 0853   MCHC 32.5 08/04/2022 0732   RDW 12.4 09/27/2022 0853   Iron Studies No results found for: "IRON", "TIBC", "FERRITIN", "IRONPCTSAT" Lipid Panel     Component Value  Date/Time   CHOL 222 (H) 09/27/2022 0853   TRIG 51 09/27/2022 0853   HDL 103 09/27/2022 0853   CHOLHDL 2 08/01/2021 0950   VLDL 10.8 08/01/2021 0950   LDLCALC 110 (H) 09/27/2022 0853   Hepatic Function Panel     Component Value Date/Time   PROT 6.7 09/27/2022 0853   ALBUMIN 4.3 09/27/2022 0853   AST 17 09/27/2022 0853   ALT 24 09/27/2022 0853   ALKPHOS 93 09/27/2022 0853   BILITOT <0.2 09/27/2022 0853      Component Value Date/Time   TSH 3.530 09/27/2022 0853   Nutritional Lab Results  Component Value Date   VD25OH 20.2 (L) 09/27/2022    Attestations:   Burnett Sheng, acting as a medical scribe for Thomasene Lot, DO., have compiled all relevant documentation for today's office visit on behalf of Thomasene Lot, DO, while in the presence of [employer], DO.  I have reviewed the above  documentation for accuracy and completeness, and I agree with the above. Kylie Ware, D.O.  The 21st Century Cures Act was signed into law in 2016 which includes the topic of electronic health records.  This provides immediate access to information in MyChart.  This includes consultation notes, operative notes, office notes, lab results and pathology reports.  If you have any questions about what you read please let us know at your next visit so we can discuss your concerns and take corrective action if need be.  We are right here with you.

## 2022-12-07 DIAGNOSIS — F411 Generalized anxiety disorder: Secondary | ICD-10-CM | POA: Diagnosis not present

## 2022-12-07 DIAGNOSIS — F321 Major depressive disorder, single episode, moderate: Secondary | ICD-10-CM | POA: Diagnosis not present

## 2022-12-14 DIAGNOSIS — F411 Generalized anxiety disorder: Secondary | ICD-10-CM | POA: Diagnosis not present

## 2022-12-14 DIAGNOSIS — F321 Major depressive disorder, single episode, moderate: Secondary | ICD-10-CM | POA: Diagnosis not present

## 2022-12-21 ENCOUNTER — Encounter (INDEPENDENT_AMBULATORY_CARE_PROVIDER_SITE_OTHER): Payer: Self-pay | Admitting: Family Medicine

## 2022-12-21 ENCOUNTER — Ambulatory Visit (INDEPENDENT_AMBULATORY_CARE_PROVIDER_SITE_OTHER): Payer: BC Managed Care – PPO | Admitting: Family Medicine

## 2022-12-21 VITALS — BP 117/82 | HR 101 | Temp 97.9°F | Ht 66.5 in | Wt 246.0 lb

## 2022-12-21 DIAGNOSIS — E669 Obesity, unspecified: Secondary | ICD-10-CM

## 2022-12-21 DIAGNOSIS — E6609 Other obesity due to excess calories: Secondary | ICD-10-CM

## 2022-12-21 DIAGNOSIS — E88819 Insulin resistance, unspecified: Secondary | ICD-10-CM

## 2022-12-21 DIAGNOSIS — E538 Deficiency of other specified B group vitamins: Secondary | ICD-10-CM | POA: Diagnosis not present

## 2022-12-21 DIAGNOSIS — E559 Vitamin D deficiency, unspecified: Secondary | ICD-10-CM

## 2022-12-21 DIAGNOSIS — Z6839 Body mass index (BMI) 39.0-39.9, adult: Secondary | ICD-10-CM

## 2022-12-21 NOTE — Progress Notes (Signed)
Kylie Ware, D.O.  ABFM, ABOM Specializing in Clinical Bariatric Medicine  Office located at: [address] [place], [place]  [address]   Assessment and Plan:   FOR THE DISEASE OF OBESITY: BMI 39.0-39.9,adult, current 39.27 starting bmi 09/27/22- 39.75  Since last office visit on 12/06/22 patient's muscle mass has increased by 1 lb. Fat mass has decreased by 4.4 lb. Total body water has decreased by 1.8 lb.  Counseling done on how various foods will affect these numbers and how to maximize success  Total lbs lost to date: 1 lb  Total weight loss percentage to date: 0.40%    Recommended Dietary Goals Kylie Ware is currently in the action stage of change. As such, her goal is to continue weight management plan.  She has agreed to: continue current plan   Behavioral Intervention We discussed the following Behavioral Modification Strategies today: continue to work on maintaining a reduced calorie state, getting the recommended amount of protein, incorporating whole foods, making healthy choices, staying well hydrated and practicing mindfulness when eating.  Additional resources provided today: Handout on holiday eating strategies  Evidence-based interventions for health behavior change were utilized today including the discussion of self monitoring techniques, problem-solving barriers and SMART goal setting techniques.   Regarding patient's less desirable eating habits and patterns, we employed the technique of small changes.   Pt will specifically work on: measuring protein intake.   Recommended Physical Activity Goals Kylie Ware has been advised to work up to 150 minutes of moderate intensity aerobic activity a week and strengthening exercises 2-3 times per week for cardiovascular health, weight loss maintenance and preservation of muscle mass.   She has agreed to : Continue current level of physical activity    Pharmacotherapy We both agreed to : continue current  anti-obesity medication regimen   FOR ASSOCIATED CONDITIONS ADDRESSED TODAY:  Insulin resistance Assessment & Plan: Most recent Hemoglobin A1c & Fasting Insulin: Lab Results  Component Value Date   HGBA1C 5.4 09/27/2022   HGBA1C 5.4 08/02/2012   INSULIN 13.6 09/27/2022    Pt reports not taking her Metformin on a daily basis - when she does take her Metformin, she denies any GI upset. Will sometimes have hunger/cravings depending on the day - she does not know what contributes to this.  Encouraged pt to take Metformin on a consistent basis. Reminded pt that consuming adequate amounts of protein per meal can also help with hunger/cravings. Continue with weight loss therapy.   Orders: -     Hemoglobin A1c -     Insulin, random   Vitamin D deficiency Assessment & Plan: Most recent vitamin D of 20.2 on 09/27/22. Currently on ERGO 50,000 units once a week without any adverse side effects.   Continue with their weight loss efforts and high dose vitamin D supplementation.   Orders: -     VITAMIN D 25 Hydroxy (Vit-D Deficiency, Fractures)   B12 deficiency Assessment & Plan: Most recent Vitamin B12:  Lab Results  Component Value Date   VITAMINB12 427 09/27/2022   B12 level was 427, not at goal of over 500. She reports good compliance and tolerance with OTC B12 5000 mcg once weekly.   Continue with current supplementation regiment and continue with B12 rich prudent nutritional plan.   Orders: -     Vitamin B12   FOLLOW UP: Return 01/11/23. She was informed of the importance of frequent follow up visits to maximize her success with intensive lifestyle modifications for her  multiple health conditions.  Kylie Ware is aware that we will review all of her lab results at our next visit.  She is aware that if anything is critical/ life threatening with the results, we will be contacting her via MyChart prior to the office visit to discuss management.    Subjective:   Chief  complaint: Obesity Kylie Ware is here to discuss her progress with her obesity treatment plan. She is keeping a food journal and adhering to recommended goals of 1400-1500 calories and 115+ grams of protein and states she is following her eating plan approximately 75% of the time. She states she is walking 9,500-10,000 steps daily.   Interval History:  Kylie Ware is here for a follow up office visit. Since last OV, Kylie Ware is down 3 lbs. She is journaling on most days. She typically averages 1500-1600 calories and exceeds 115 grams protein. She admits to not measuring her protein intake. Has 2 protein supplements a day. Will sometimes have hunger/cravings depending on the day - she does not know what contributes to this.  Barriers identified: none  Pharmacotherapy for weight loss: She is prescribed  Metformin 500 mg once daily; she does not take everyday.    Review of Systems:  Pertinent positives were addressed with patient today.  Reviewed by clinician on day of visit: allergies, medications, problem list, medical history, surgical history, family history, social history, and previous encounter notes.  Weight Summary and Biometrics   Weight Lost Since Last Visit: 1lb  Weight Gained Since Last Visit: 0lb    Vitals Temp: 97.9 F (36.6 C) BP: 117/82 Pulse Rate: (!) 101 SpO2: 98 %   Anthropometric Measurements Height: 5' 6.5" (1.689 m) Weight: 246 lb (111.6 kg) BMI (Calculated): 39.12 Weight at Last Visit: 247lb Weight Lost Since Last Visit: 1lb Weight Gained Since Last Visit: 0lb Starting Weight: 248lb Total Weight Loss (lbs): 2 lb (0.907 kg) Peak Weight: 281lb   Body Composition  Body Fat %: 45.9 % Fat Mass (lbs): 113 lbs Muscle Mass (lbs): 126.2 lbs Total Body Water (lbs): 92.6 lbs Visceral Fat Rating : 10   Other Clinical Data Fasting: yes Labs: yes Today's Visit #: 5 Starting Date: 09/27/22   Objective:   PHYSICAL EXAM: Blood pressure 117/82,  pulse (!) 101, temperature 97.9 F (36.6 C), height 5' 6.5" (1.689 m), weight 246 lb (111.6 kg), SpO2 98%. Body mass index is 39.11 kg/m.  General: she is overweight, cooperative and in no acute distress. PSYCH: Has normal mood, affect and thought process.   HEENT: EOMI, sclerae are anicteric. Lungs: Normal breathing effort, no conversational dyspnea. Extremities: Moves * 4 Neurologic: A and O * 3, good insight  DIAGNOSTIC DATA REVIEWED: BMET    Component Value Date/Time   NA 140 09/27/2022 0853   K 4.4 09/27/2022 0853   CL 103 09/27/2022 0853   CO2 22 09/27/2022 0853   GLUCOSE 86 09/27/2022 0853   GLUCOSE 84 06/06/2022 1020   BUN 9 09/27/2022 0853   CREATININE 0.55 (L) 09/27/2022 0853   CREATININE 0.60 08/02/2012 1440   CALCIUM 9.3 09/27/2022 0853   Lab Results  Component Value Date   HGBA1C 5.4 09/27/2022   HGBA1C 5.4 08/02/2012   Lab Results  Component Value Date   INSULIN 13.6 09/27/2022   Lab Results  Component Value Date   TSH 3.530 09/27/2022   CBC    Component Value Date/Time   WBC 6.1 09/27/2022 0853   WBC 7.9 08/04/2022 0732   RBC  4.66 09/27/2022 0853   RBC 4.42 08/04/2022 0732   HGB 12.8 09/27/2022 0853   HCT 40.9 09/27/2022 0853   PLT 285 09/27/2022 0853   MCV 88 09/27/2022 0853   MCH 27.5 09/27/2022 0853   MCHC 31.3 (L) 09/27/2022 0853   MCHC 32.5 08/04/2022 0732   RDW 12.4 09/27/2022 0853   Iron Studies No results found for: "IRON", "TIBC", "FERRITIN", "IRONPCTSAT" Lipid Panel     Component Value Date/Time   CHOL 222 (H) 09/27/2022 0853   TRIG 51 09/27/2022 0853   HDL 103 09/27/2022 0853   CHOLHDL 2 08/01/2021 0950   VLDL 10.8 08/01/2021 0950   LDLCALC 110 (H) 09/27/2022 0853   Hepatic Function Panel     Component Value Date/Time   PROT 6.7 09/27/2022 0853   ALBUMIN 4.3 09/27/2022 0853   AST 17 09/27/2022 0853   ALT 24 09/27/2022 0853   ALKPHOS 93 09/27/2022 0853   BILITOT <0.2 09/27/2022 0853      Component Value Date/Time    TSH 3.530 09/27/2022 0853   Nutritional Lab Results  Component Value Date   VD25OH 20.2 (L) 09/27/2022    Attestations:   I, Special Puri, acting as a Stage manager for Thomasene Lot, DO., have compiled all relevant documentation for today's office visit on behalf of Thomasene Lot, DO, while in the presence of [employer], DO.  I have reviewed the above documentation for accuracy and completeness, and I agree with the above. Kylie Ware, D.O.  The 21st Century Cures Act was signed into law in 2016 which includes the topic of electronic health records.  This provides immediate access to information in MyChart.  This includes consultation notes, operative notes, office notes, lab results and pathology reports.  If you have any questions about what you read please let us know at your next visit so we can discuss your concerns and take corrective action if need be.  We are right here with you.

## 2022-12-22 DIAGNOSIS — F321 Major depressive disorder, single episode, moderate: Secondary | ICD-10-CM | POA: Diagnosis not present

## 2022-12-22 DIAGNOSIS — F411 Generalized anxiety disorder: Secondary | ICD-10-CM | POA: Diagnosis not present

## 2022-12-22 LAB — HEMOGLOBIN A1C
Est. average glucose Bld gHb Est-mCnc: 105 mg/dL
Hgb A1c MFr Bld: 5.3 % (ref 4.8–5.6)

## 2022-12-22 LAB — VITAMIN B12: Vitamin B-12: 521 pg/mL (ref 232–1245)

## 2022-12-22 LAB — INSULIN, RANDOM: INSULIN: 11.8 u[IU]/mL (ref 2.6–24.9)

## 2022-12-22 LAB — VITAMIN D 25 HYDROXY (VIT D DEFICIENCY, FRACTURES): Vit D, 25-Hydroxy: 27.3 ng/mL — ABNORMAL LOW (ref 30.0–100.0)

## 2023-01-01 DIAGNOSIS — F321 Major depressive disorder, single episode, moderate: Secondary | ICD-10-CM | POA: Diagnosis not present

## 2023-01-01 DIAGNOSIS — F411 Generalized anxiety disorder: Secondary | ICD-10-CM | POA: Diagnosis not present

## 2023-01-11 ENCOUNTER — Encounter (INDEPENDENT_AMBULATORY_CARE_PROVIDER_SITE_OTHER): Payer: Self-pay | Admitting: Family Medicine

## 2023-01-11 ENCOUNTER — Ambulatory Visit (INDEPENDENT_AMBULATORY_CARE_PROVIDER_SITE_OTHER): Payer: BC Managed Care – PPO | Admitting: Family Medicine

## 2023-01-11 VITALS — BP 119/87 | HR 97 | Temp 98.0°F | Ht 66.5 in | Wt 250.0 lb

## 2023-01-11 DIAGNOSIS — E66812 Obesity, class 2: Secondary | ICD-10-CM

## 2023-01-11 DIAGNOSIS — E538 Deficiency of other specified B group vitamins: Secondary | ICD-10-CM

## 2023-01-11 DIAGNOSIS — E559 Vitamin D deficiency, unspecified: Secondary | ICD-10-CM | POA: Diagnosis not present

## 2023-01-11 DIAGNOSIS — Z6839 Body mass index (BMI) 39.0-39.9, adult: Secondary | ICD-10-CM

## 2023-01-11 DIAGNOSIS — E669 Obesity, unspecified: Secondary | ICD-10-CM | POA: Diagnosis not present

## 2023-01-11 DIAGNOSIS — E88819 Insulin resistance, unspecified: Secondary | ICD-10-CM

## 2023-01-11 MED ORDER — METFORMIN HCL 500 MG PO TABS: 500.0000 mg | ORAL_TABLET | Freq: Two times a day (BID) | ORAL | 0 refills | Status: DC

## 2023-01-11 MED ORDER — VITAMIN D (ERGOCALCIFEROL) 1.25 MG (50000 UNIT) PO CAPS: ORAL_CAPSULE | ORAL | 0 refills | Status: DC

## 2023-01-11 NOTE — Progress Notes (Signed)
Carlye Grippe, D.O.  ABFM, ABOM Specializing in Clinical Bariatric Medicine  Office located at: [address] [place], [place]  [address]   Assessment and Plan:   FOR THE DISEASE OF OBESITY: BMI 39.0-39.9,adult, current 39.75 starting bmi 09/27/22- 39.75 Assessment & Plan Since last office visit on 12/21/22 patient's  muscle mass has increased by 1.6 lb. Fat mass has increased by 3.2 lb. Total body water has increased by 1 lb.  Counseling done on how various foods will affect these numbers and how to maximize success  Total lbs lost to date: + 2 lbs Total weight loss percentage to date: +0.81%    Recommended Dietary Goals Kylie Ware is currently in the action stage of change. As such, her goal is to continue weight management plan.  She has agreed to: continue current plan   Behavioral Intervention We discussed the following today: increasing lean protein intake to established goals, decreasing simple carbohydrates , and work on tracking and journaling calories.  Additional resources provided today: None  Evidence-based interventions for health behavior change were utilized today including the discussion of self monitoring techniques, problem-solving barriers and SMART goal setting techniques.   Regarding patient's less desirable eating habits and patterns, we employed the technique of small changes.   Pt will specifically work on: journaling 100% of the time.   Recommended Physical Activity Goals Oluwatobiloba has been advised to work up to 150 minutes of moderate intensity aerobic activity a week and strengthening exercises 2-3 times per week for cardiovascular health, weight loss maintenance and preservation of muscle mass.   She has agreed to : Continue current level of physical activity    Pharmacotherapy We both agreed to : increase Metformin to twice daily   FOR ASSOCIATED CONDITIONS ADDRESSED TODAY:  Vitamin D deficiency Assessment & Plan: Lab Results   Component Value Date   VD25OH 27.3 (L) 12/21/2022   VD25OH 20.2 (L) 09/27/2022   Vitamin D levels are not at goal of 50 to 70. She endorses consistently taking her ERGO 50,000 units once a week.   I discussed the importance of vitamin D to the patient's health and well-being as well as to their ability to lose weight. Pt instructed to increase ERGO to 50,000 units twice weekly.   Orders: -     Vitamin D (Ergocalciferol); 1 po q Wed and q Sat  Dispense: 10 capsule; Refill: 0   Insulin resistance Assessment & Plan: Lab Results  Component Value Date   HGBA1C 5.3 12/21/2022   HGBA1C 5.4 09/27/2022   HGBA1C 5.4 08/02/2012   INSULIN 11.8 12/21/2022   INSULIN 13.6 09/27/2022    Pt reports that she is more consisently taking her Metformin - when she does take it, she states that it helps abate hunger and cravings. She does endorse still doing some snacking after dinner. Fasting insulin level is trending toward. No concerns with Hemoglobin A1c.   Will increase Metformin to 500 mg twice daily. Continue to decrease simple carbs/ sugars; increase fiber and proteins -> follow her meal plan.   Orders: -     metFORMIN HCl; Take 1 tablet (500 mg total) by mouth 2 (two) times daily with a meal.  Dispense: 60 tablet; Refill: 0   B12 deficiency Assessment & Plan: Lab Results  Component Value Date   VITAMINB12 521 12/21/2022   She reports not taking any B12 supplementation. B12 level is 521 - at goal. Continue to focus on B12 rich foods.    Follow  up:   Return 01/25/23. She was informed of the importance of frequent follow up visits to maximize her success with intensive lifestyle modifications for her multiple health conditions.  Subjective:   Chief complaint: Obesity Kylie Ware is here to discuss her progress with her obesity treatment plan. She is keeping a food journal and adhering to recommended goals of 1400-1500 calories and 115+ grams of protein and states she is following her  eating plan approximately 50% of the time. She states she has been walking 10,000 steps the last 7 days.   Interval History:  Kylie Ware is here for a follow up office visit. Since last OV, Ms.Lotspeich is up 4 lbs. Did not track for 4 days over Thanksgiving holiday. She states that most of her Thanksgiving plate was [place]. She is somewhat back on track with journaling intake.   Pharmacotherapy for weight loss: She is prescribed Metformin 500 mg daily.   Review of Systems:  Pertinent positives were addressed with patient today.  Reviewed by clinician on day of visit: allergies, medications, problem list, medical history, surgical history, family history, social history, and previous encounter notes.  Weight Summary and Biometrics   Weight Lost Since Last Visit: 0lb  Weight Gained Since Last Visit: 4lb   Vitals Temp: 98 F (36.7 C) BP: 119/87 Pulse Rate: 97 SpO2: 96 %   Anthropometric Measurements Height: 5' 6.5" (1.689 m) Weight: 250 lb (113.4 kg) BMI (Calculated): 39.75 Weight at Last Visit: 246lb Weight Lost Since Last Visit: 0lb Weight Gained Since Last Visit: 4lb Starting Weight: 248lb Total Weight Loss (lbs): 0 lb (0 kg) Peak Weight: 281lb   Body Composition  Body Fat %: 46.3 % Fat Mass (lbs): 116.2 lbs Muscle Mass (lbs): 127.8 lbs Total Body Water (lbs): 93.6 lbs Visceral Fat Rating : 11   Other Clinical Data Fasting: no Labs: no Today's Visit #: 6 Starting Date: 09/27/22   Objective:   PHYSICAL EXAM: Blood pressure 119/87, pulse 97, temperature 98 F (36.7 C), height 5' 6.5" (1.689 m), weight 250 lb (113.4 kg), SpO2 96%. Body mass index is 39.75 kg/m.  General: she is overweight, cooperative and in no acute distress. PSYCH: Has normal mood, affect and thought process.   HEENT: EOMI, sclerae are anicteric. Lungs: Normal breathing effort, no conversational dyspnea. Extremities: Moves * 4 Neurologic: A and O * 3, good insight  DIAGNOSTIC  DATA REVIEWED: BMET    Component Value Date/Time   NA 140 09/27/2022 0853   K 4.4 09/27/2022 0853   CL 103 09/27/2022 0853   CO2 22 09/27/2022 0853   GLUCOSE 86 09/27/2022 0853   GLUCOSE 84 06/06/2022 1020   BUN 9 09/27/2022 0853   CREATININE 0.55 (L) 09/27/2022 0853   CREATININE 0.60 08/02/2012 1440   CALCIUM 9.3 09/27/2022 0853   Lab Results  Component Value Date   HGBA1C 5.3 12/21/2022   HGBA1C 5.4 08/02/2012   Lab Results  Component Value Date   INSULIN 11.8 12/21/2022   INSULIN 13.6 09/27/2022   Lab Results  Component Value Date   TSH 3.530 09/27/2022   CBC    Component Value Date/Time   WBC 6.1 09/27/2022 0853   WBC 7.9 08/04/2022 0732   RBC 4.66 09/27/2022 0853   RBC 4.42 08/04/2022 0732   HGB 12.8 09/27/2022 0853   HCT 40.9 09/27/2022 0853   PLT 285 09/27/2022 0853   MCV 88 09/27/2022 0853   MCH 27.5 09/27/2022 0853   MCHC 31.3 (L) 09/27/2022 1610  MCHC 32.5 08/04/2022 0732   RDW 12.4 09/27/2022 0853   Iron Studies No results found for: "IRON", "TIBC", "FERRITIN", "IRONPCTSAT" Lipid Panel     Component Value Date/Time   CHOL 222 (H) 09/27/2022 0853   TRIG 51 09/27/2022 0853   HDL 103 09/27/2022 0853   CHOLHDL 2 08/01/2021 0950   VLDL 10.8 08/01/2021 0950   LDLCALC 110 (H) 09/27/2022 0853   Hepatic Function Panel     Component Value Date/Time   PROT 6.7 09/27/2022 0853   ALBUMIN 4.3 09/27/2022 0853   AST 17 09/27/2022 0853   ALT 24 09/27/2022 0853   ALKPHOS 93 09/27/2022 0853   BILITOT <0.2 09/27/2022 0853      Component Value Date/Time   TSH 3.530 09/27/2022 0853   Nutritional Lab Results  Component Value Date   VD25OH 27.3 (L) 12/21/2022   VD25OH 20.2 (L) 09/27/2022    Attestations:   I, Special Puri, acting as a Stage manager for Thomasene Lot, DO., have compiled all relevant documentation for today's office visit on behalf of Thomasene Lot, DO, while in the presence of [employer], DO.  Patient was in the office  today and time spent on visit including pre-visit chart review and post-visit care/coordination of care and electronic medical record documentation was 40 minutes. 50% of the time was in face to face counseling of this patient's medical condition(s) and providing education on treatment options to include the first-line treatment of diet and lifestyle modification.   I have reviewed the above documentation for accuracy and completeness, and I agree with the above. Carlye Grippe, D.O.  The 21st Century Cures Act was signed into law in 2016 which includes the topic of electronic health records.  This provides immediate access to information in MyChart.  This includes consultation notes, operative notes, office notes, lab results and pathology reports.  If you have any questions about what you read please let us know at your next visit so we can discuss your concerns and take corrective action if need be.  We are right here with you.

## 2023-01-12 DIAGNOSIS — F411 Generalized anxiety disorder: Secondary | ICD-10-CM | POA: Diagnosis not present

## 2023-01-12 DIAGNOSIS — F321 Major depressive disorder, single episode, moderate: Secondary | ICD-10-CM | POA: Diagnosis not present

## 2023-01-16 DIAGNOSIS — F411 Generalized anxiety disorder: Secondary | ICD-10-CM | POA: Diagnosis not present

## 2023-01-16 DIAGNOSIS — F321 Major depressive disorder, single episode, moderate: Secondary | ICD-10-CM | POA: Diagnosis not present

## 2023-01-25 ENCOUNTER — Ambulatory Visit (INDEPENDENT_AMBULATORY_CARE_PROVIDER_SITE_OTHER): Payer: BC Managed Care – PPO | Admitting: Family Medicine

## 2023-01-25 DIAGNOSIS — F321 Major depressive disorder, single episode, moderate: Secondary | ICD-10-CM | POA: Diagnosis not present

## 2023-01-25 DIAGNOSIS — F411 Generalized anxiety disorder: Secondary | ICD-10-CM | POA: Diagnosis not present

## 2023-02-02 DIAGNOSIS — F321 Major depressive disorder, single episode, moderate: Secondary | ICD-10-CM | POA: Diagnosis not present

## 2023-02-02 DIAGNOSIS — F411 Generalized anxiety disorder: Secondary | ICD-10-CM | POA: Diagnosis not present

## 2023-02-03 ENCOUNTER — Other Ambulatory Visit (INDEPENDENT_AMBULATORY_CARE_PROVIDER_SITE_OTHER): Payer: Self-pay | Admitting: Family Medicine

## 2023-02-03 DIAGNOSIS — E559 Vitamin D deficiency, unspecified: Secondary | ICD-10-CM

## 2023-02-09 DIAGNOSIS — F411 Generalized anxiety disorder: Secondary | ICD-10-CM | POA: Diagnosis not present

## 2023-02-09 DIAGNOSIS — F321 Major depressive disorder, single episode, moderate: Secondary | ICD-10-CM | POA: Diagnosis not present

## 2023-02-14 ENCOUNTER — Other Ambulatory Visit: Payer: Self-pay | Admitting: Family

## 2023-02-14 DIAGNOSIS — F419 Anxiety disorder, unspecified: Secondary | ICD-10-CM

## 2023-02-15 MED ORDER — ESCITALOPRAM OXALATE 10 MG PO TABS: 10.0000 mg | ORAL_TABLET | Freq: Every day | ORAL | 0 refills | Status: DC

## 2023-02-21 ENCOUNTER — Encounter (INDEPENDENT_AMBULATORY_CARE_PROVIDER_SITE_OTHER): Payer: Self-pay | Admitting: Family Medicine

## 2023-02-21 ENCOUNTER — Ambulatory Visit (INDEPENDENT_AMBULATORY_CARE_PROVIDER_SITE_OTHER): Payer: BC Managed Care – PPO | Admitting: Family Medicine

## 2023-02-21 VITALS — BP 118/79 | HR 87 | Temp 98.3°F | Ht 66.5 in | Wt 253.0 lb

## 2023-02-21 DIAGNOSIS — E88819 Insulin resistance, unspecified: Secondary | ICD-10-CM

## 2023-02-21 DIAGNOSIS — E559 Vitamin D deficiency, unspecified: Secondary | ICD-10-CM | POA: Diagnosis not present

## 2023-02-21 DIAGNOSIS — R0602 Shortness of breath: Secondary | ICD-10-CM

## 2023-02-21 DIAGNOSIS — Z6839 Body mass index (BMI) 39.0-39.9, adult: Secondary | ICD-10-CM

## 2023-02-21 DIAGNOSIS — Z72821 Inadequate sleep hygiene: Secondary | ICD-10-CM | POA: Diagnosis not present

## 2023-02-21 DIAGNOSIS — E669 Obesity, unspecified: Secondary | ICD-10-CM

## 2023-02-21 DIAGNOSIS — E66812 Obesity, class 2: Secondary | ICD-10-CM

## 2023-02-21 MED ORDER — METFORMIN HCL 500 MG PO TABS: 500.0000 mg | ORAL_TABLET | Freq: Two times a day (BID) | ORAL | 0 refills | Status: DC

## 2023-02-21 MED ORDER — VITAMIN D (ERGOCALCIFEROL) 1.25 MG (50000 UNIT) PO CAPS: ORAL_CAPSULE | ORAL | 0 refills | Status: DC

## 2023-02-21 NOTE — Progress Notes (Signed)
Kylie Ware, D.O.  ABFM, ABOM Specializing in Clinical Bariatric Medicine  Office located at: [address] [place], [place]  [address]   Assessment and Plan:   FOR THE DISEASE OF OBESITY: BMI 39.0-39.9,adult, current 40.23 starting bmi 09/27/22- 39.75 Assessment & Plan: Since last office visit on 01/11/23 patient's  Muscle mass has decreased by 0.2 lb. Fat mass has increased by 2.4 lb. Total body water has increased by 1.2 lb.  Counseling done on how various foods will affect these numbers and how to maximize success  Total lbs lost to date: + 5 lbs  Total weight loss percentage to date: + 2.02%    Recommended Dietary Goals [name] is currently in the action stage of change. As such, her goal is to continue weight management plan.  She has agreed to: continue current plan   Behavioral Intervention We discussed the following today: continue to work on maintaining a reduced calorie state, getting the recommended amount of protein, incorporating whole foods, making healthy choices, staying well hydrated and practicing mindfulness when eating.  Additional resources provided today: None  Evidence-based interventions for health behavior change were utilized today including the discussion of self monitoring techniques, problem-solving barriers and SMART goal setting techniques.   Regarding patient's less desirable eating habits and patterns, we employed the technique of small changes.   Pt will specifically work on: focusing on sleep hygiene and moving more.   Recommended Physical Activity Goals [name] has been advised to work up to 150 minutes of moderate intensity aerobic activity a week and strengthening exercises 2-3 times per week for cardiovascular health, weight loss maintenance and preservation of muscle mass.   She has agreed to : Think about enjoyable ways to increase daily physical activity and overcoming barriers to exercise   Pharmacotherapy We both  agreed to : continue with nutritional and behavioral strategies and continue current anti-obesity medication regimen   FOR ASSOCIATED CONDITIONS ADDRESSED TODAY:  Poor sleep hygiene Assessment & Plan: Pt admits to having poor sleep hygiene. On most days, she sleeps between 1 am - 3 am and wakes up between 8 am - 10 am. Discussed the importance of having a routine sleep schedule, in other words trying to sleep earlier and waking up at the same time every day. Recommended pt to aim for 7-9 hrs of sleep per night. Try to use sleep stories and or  meditation to fall asleep.    Insulin resistance Assessment & Plan: LOV, we increased pt's Metformin to 500 mg twice daily. Pt has stopped taking the second tablet because it made her nauseous. Pt instructed to take 1 tablet with a meal and then 0.5 tablet with a different meal for 1-2 weeks and if tolerating well to then increase to 2 full tablets daily. Continue weight loss therapy.   Orders: -     metFORMIN HCl; Take 1 tablet (500 mg total) by mouth 2 (two) times daily with a meal.  Dispense: 60 tablet; Refill: 0   Vitamin D deficiency Assessment & Plan: Pt admits that she has been forgetting to take her high dose vitamin D twice weekly - she remembers to take the 1st tablet, but not the second. We strategized different ways to help pt remember to take both, like setting alarms. Continue with supplement.   Orders: -     Vitamin D (Ergocalciferol); 1 po q Wed and q Sat  Dispense: 10 capsule; Refill: 0   Follow up:   Return 03/21/2023. She  was informed of the importance of frequent follow up visits to maximize her success with intensive lifestyle modifications for her multiple health conditions.  Subjective:   Chief complaint: Obesity [name] is here to discuss her progress with her obesity treatment plan. She is  keeping a food journal and adhering to recommended goals of 1400-1500 calories and 115+ grams of protein and states she is  following her eating plan approximately 90% of the time. She states she is not exercising.   Interval History:  [name] is here for a follow up office visit. Since last OV on 01/11/2023, Ms.Moncrieffe is up 3 lbs. She acknowledges eating foods rich in carbohydrates and fats for a couple of days over the holiday period.  She has been relatively  back on track food wise over the last 2 weeks. 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 Pt would like to discuss ways to improve sleep hygiene.   Pharmacotherapy for weight loss: She is currently taking  Metformin 500 mg once daily .   Review of Systems:  Pertinent positives were addressed with patient today.  Reviewed by clinician on day of visit: allergies, medications, problem list, medical history, surgical history, family history, social history, and previous encounter notes.  Weight Summary and Biometrics   Weight Lost Since Last Visit: 0  Weight Gained Since Last Visit: 3 lb   Vitals Temp: 98.3 F (36.8 C) BP: 118/79 Pulse Rate: 87 SpO2: 99 %   Anthropometric Measurements Height: 5' 6.5" (1.689 m) Weight: 253 lb (114.8 kg) BMI (Calculated): 40.23 Weight at Last Visit: 250 Weight Lost Since Last Visit: 0 Weight Gained Since Last Visit: 3 lb Starting Weight: 248 lb Total Weight Loss (lbs): 0 lb (0 kg) Peak Weight: 281 lb   Body Composition  Body Fat %: 46.9 % Fat Mass (lbs): 118.6 lbs Muscle Mass (lbs): 127.6 lbs Total Body Water (lbs): 94.8 lbs Visceral Fat Rating : 11   Other Clinical Data Fasting: yes Labs: no Today's Visit #: 7 Starting Date: 09/27/22   Objective:   PHYSICAL EXAM: Blood pressure 118/79, pulse 87, temperature 98.3 F (36.8 C), height 5' 6.5" (1.689 m), weight 253 lb (114.8 kg), last menstrual period 02/20/2023, SpO2 99%. Body mass index is 40.22 kg/m.  General: she is overweight, cooperative and in no acute  distress. PSYCH: Has normal mood, affect and thought process.   HEENT: EOMI, sclerae are anicteric. Lungs: Normal breathing effort, no conversational dyspnea. Extremities: Moves * 4 Neurologic: A and O * 3, good insight  DIAGNOSTIC DATA REVIEWED: BMET    Component Value Date/Time   NA 140 09/27/2022 0853   K 4.4 09/27/2022 0853   CL 103 09/27/2022 0853   CO2 22 09/27/2022 0853   GLUCOSE 86 09/27/2022 0853   GLUCOSE 84 06/06/2022 1020   BUN 9 09/27/2022 0853   CREATININE 0.55 (L) 09/27/2022 0853   CREATININE 0.60 08/02/2012 1440   CALCIUM 9.3 09/27/2022 0853   Lab Results  Component Value Date   HGBA1C 5.3 12/21/2022   HGBA1C 5.4 08/02/2012   Lab Results  Component Value Date   INSULIN 11.8 12/21/2022   INSULIN 13.6 09/27/2022   Lab Results  Component Value Date   TSH 3.530 09/27/2022   CBC    Component Value Date/Time   WBC 6.1 09/27/2022 0853   WBC 7.9 08/04/2022 0732   RBC 4.66 09/27/2022 0853   RBC 4.42 08/04/2022 0732  HGB 12.8 09/27/2022 0853   HCT 40.9 09/27/2022 0853   PLT 285 09/27/2022 0853   MCV 88 09/27/2022 0853   MCH 27.5 09/27/2022 0853   MCHC 31.3 (L) 09/27/2022 0853   MCHC 32.5 08/04/2022 0732   RDW 12.4 09/27/2022 0853   Iron Studies No results found for: "IRON", "TIBC", "FERRITIN", "IRONPCTSAT" Lipid Panel     Component Value Date/Time   CHOL 222 (H) 09/27/2022 0853   TRIG 51 09/27/2022 0853   HDL 103 09/27/2022 0853   CHOLHDL 2 08/01/2021 0950   VLDL 10.8 08/01/2021 0950   LDLCALC 110 (H) 09/27/2022 0853   Hepatic Function Panel     Component Value Date/Time   PROT 6.7 09/27/2022 0853   ALBUMIN 4.3 09/27/2022 0853   AST 17 09/27/2022 0853   ALT 24 09/27/2022 0853   ALKPHOS 93 09/27/2022 0853   BILITOT <0.2 09/27/2022 0853      Component Value Date/Time   TSH 3.530 09/27/2022 0853   Nutritional Lab Results  Component Value Date   VD25OH 27.3 (L) 12/21/2022   VD25OH 20.2 (L) 09/27/2022    Attestations:   I,  Special Puri, acting as a Stage manager for Thomasene Lot, DO., have compiled all relevant documentation for today's office visit on behalf of Thomasene Lot, DO, while in the presence of [employer], DO.  I have reviewed the above documentation for accuracy and completeness, and I agree with the above. Kylie Ware, D.O.  The 21st Century Cures Act was signed into law in 2016 which includes the topic of electronic health records.  This provides immediate access to information in MyChart.  This includes consultation notes, operative notes, office notes, lab results and pathology reports.  If you have any questions about what you read please let us know at your next visit so we can discuss your concerns and take corrective action if need be.  We are right here with you.

## 2023-03-01 DIAGNOSIS — F321 Major depressive disorder, single episode, moderate: Secondary | ICD-10-CM | POA: Diagnosis not present

## 2023-03-01 DIAGNOSIS — F411 Generalized anxiety disorder: Secondary | ICD-10-CM | POA: Diagnosis not present

## 2023-03-06 ENCOUNTER — Ambulatory Visit: Payer: BC Managed Care – PPO | Admitting: Family

## 2023-03-09 DIAGNOSIS — F321 Major depressive disorder, single episode, moderate: Secondary | ICD-10-CM | POA: Diagnosis not present

## 2023-03-09 DIAGNOSIS — F411 Generalized anxiety disorder: Secondary | ICD-10-CM | POA: Diagnosis not present

## 2023-03-16 DIAGNOSIS — F411 Generalized anxiety disorder: Secondary | ICD-10-CM | POA: Diagnosis not present

## 2023-03-16 DIAGNOSIS — F321 Major depressive disorder, single episode, moderate: Secondary | ICD-10-CM | POA: Diagnosis not present

## 2023-03-21 ENCOUNTER — Encounter (INDEPENDENT_AMBULATORY_CARE_PROVIDER_SITE_OTHER): Payer: Self-pay | Admitting: Family Medicine

## 2023-03-21 ENCOUNTER — Ambulatory Visit (INDEPENDENT_AMBULATORY_CARE_PROVIDER_SITE_OTHER): Payer: BC Managed Care – PPO | Admitting: Family Medicine

## 2023-03-21 VITALS — BP 124/84 | HR 108 | Temp 97.6°F | Ht 66.5 in | Wt 262.0 lb

## 2023-03-21 DIAGNOSIS — E66812 Obesity, class 2: Secondary | ICD-10-CM

## 2023-03-21 DIAGNOSIS — Z72821 Inadequate sleep hygiene: Secondary | ICD-10-CM

## 2023-03-21 DIAGNOSIS — Z6839 Body mass index (BMI) 39.0-39.9, adult: Secondary | ICD-10-CM | POA: Diagnosis not present

## 2023-03-21 DIAGNOSIS — E559 Vitamin D deficiency, unspecified: Secondary | ICD-10-CM

## 2023-03-21 DIAGNOSIS — E88819 Insulin resistance, unspecified: Secondary | ICD-10-CM

## 2023-03-21 DIAGNOSIS — E669 Obesity, unspecified: Secondary | ICD-10-CM

## 2023-03-21 DIAGNOSIS — E6609 Other obesity due to excess calories: Secondary | ICD-10-CM

## 2023-03-21 MED ORDER — VITAMIN D (ERGOCALCIFEROL) 1.25 MG (50000 UNIT) PO CAPS: ORAL_CAPSULE | ORAL | 0 refills | Status: DC

## 2023-03-21 MED ORDER — METFORMIN HCL 500 MG PO TABS: 1000.0000 mg | ORAL_TABLET | Freq: Two times a day (BID) | ORAL | 0 refills | Status: DC

## 2023-03-21 NOTE — Progress Notes (Signed)
Kylie Ware, D.O.  ABFM, ABOM Specializing in Clinical Bariatric Medicine  Office located at: [address] [place], [place]  [address]   Assessment and Plan:   Will get labs at her next visit (Vit D, BMP, Mg, and B12).   FOR THE DISEASE OF OBESITY: BMI 39.0-39.9,adult, current 41.66 starting bmi 09/27/22- 39.75 Assessment & Plan: Since last office visit on 02/21/23 patient's muscle mass has no change. Fat mass has increased by 9 lb. Total body water has increased by 4 lb.  Counseling done on how various foods will affect these numbers and how to maximize success  Total lbs lost to date: -14 (gained 14 lbs since starting program) Total weight loss percentage to date: 5.65 %   Recommended Dietary Goals Kylie Ware is currently in the action stage of change. As such, her goal is to continue weight management plan.  She has agreed to:  Journal 1400-1500 calories and 115+ g of protein using Category 2 meal plan as a guide    Behavioral Intervention We discussed the following today: increasing lean protein intake to established goals, avoiding skipping meals, work on meal planning and preparation, work on managing stress, creating time for self-care and relaxation, better snacking choices, celebration eating strategies, and continue to work on maintaining a reduced calorie state, getting the recommended amount of protein, incorporating whole foods, making healthy choices, staying well hydrated and practicing mindfulness when eating.  Additional resources provided today: None  Evidence-based interventions for health behavior change were utilized today including the discussion of self monitoring techniques, problem-solving barriers and SMART goal setting techniques.   Regarding patient's less desirable eating habits and patterns, we employed the technique of small changes.   Pt will specifically work on: Limit celebration eating for her birthday to one off-plan meal for next  visit.    Recommended Physical Activity Goals Kylie Ware has been advised to work up to 150 minutes of moderate intensity aerobic activity a week and strengthening exercises 2-3 times per week for cardiovascular health, weight loss maintenance and preservation of muscle mass.   She has agreed to :  Think about enjoyable ways to increase daily physical activity and overcoming barriers to exercise and Increase physical activity in their day and reduce sedentary time (increase NEAT).   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 She has been trying to go to bed around 11pm and has to wake up for work at 6 am. Pt plans to try getting into bed earlier to improve sleep hygiene. She puts on ASMR recordings fireplace with rain sounds at a very low volume.   Continue with current sleep hygiene strategies. Pt also encouraged to exercise during the day to improve sleep hygiene. Avoid blue light before bedtime in an effort to prevent the suppression of melatonin production. Will continue to monitor condition as it relates to her weight loss journey.    Insulin resistance Assessment & Plan: Lab Results  Component Value Date   HGBA1C 5.3 12/21/2022   HGBA1C 5.4 09/27/2022   HGBA1C 5.4 08/02/2012   INSULIN 11.8 12/21/2022   INSULIN 13.6 09/27/2022    Pt is currently on Metformin 500 mg BID with good compliance and tolerance. Hunger and cravings are uncontrolled. While at work she  struggles finding on-plan snacks from the vending machine.   Encouraged pt to bring her own low carb high protein snacks to work to avoid relying on unhealthy snacks available at her work office. We mutually agreed to increase her Metformin to  1,000 mg BID for better control of hunger/cravings. Reviewed possible side effects with increasing Metformin (loose stools, nausea, etc.), especially with off plan eating/snacking, pt verbalized understanding. To minimize side effects, I recommend gradually increase the dose; 1 tab in the AM and 1.5 tabs in the PM for a week, then 1.5 tabs BID the next week. If well tolerates, increase to 2 tabs BID (1000 mg BID). Given she tends to snack most in the morning/afternoons, I also recommend she take Metformin with breakfast and lunch. Will recheck BMP and Magnesium at her next visit.   Orders: - Increase Metformin to 1,000 mg BID   Vitamin D deficiency Assessment & Plan: Lab Results  Component Value Date   VD25OH 27.3 (L) 12/21/2022   VD25OH 20.2 (L) 09/27/2022   Her vitamin D was still sub-optimal. She is compliant with ERGO 50K units twice weekly with good tolerance. Taking on Monday and Thursdays. No acute concerns.   Continue with current vitamin supplementation. No changes made today. Will continue to monitor condition. Will recheck Vit D and B12 at her next OV.   Orders: - Refill ERGO today, no changes.    Follow up:   Return in about 4 weeks (around 04/18/2023). She was informed of the importance of frequent follow up visits to maximize her success with intensive lifestyle modifications for her multiple health conditions.  Subjective:   Chief complaint: Obesity Kylie Ware is here to discuss her progress with her obesity treatment plan. She is on the keeping a food journal and adhering to recommended goals of 1400-1500 calories and 115+ g of protein and states she is following her eating plan approximately 50% of the time. She states she is walking 30-45 minutes 3 days per week.  Interval History:  Kylie Ware is here for a follow up office visit. Since last OV on 02/21/23, she is up 9 lbs. In the last 2 weeks she has been better at staying on plan but 2 weeks prior she was not  following on plan much. She has still struggled with staying on plan for lunch, doing better with breakfast and dinner. When she forgets lunch she tends to get snacks from whatever is available at work ([employer], [employer], [employer], and energy drinks). Eats breakfast around 6:30 and eating lunch around 2 pm. For breakfast she tends to have a protein shake and a low carb high protein brand of over night oats (called "Oats over night"). She usually makes dinner with her roommate. Last night she had steak bowls with spinach, a little avocado, red onion, and tomatoes. Tends to eat a Pure protein bar when snacking at home.   Of note, pt is an Airline pilot and has increased stress at work due to tax season.  Pharmacotherapy for weight loss: She is currently taking Metformin (off label use for incretin effect and / or insulin resistance and / or diabetes prevention) with adequate clinical response  and without side effects..   Review of Systems:  Pertinent positives were addressed with patient today.  Reviewed by clinician on day of visit: allergies, medications, problem list, medical history, surgical history, family history, social history, and previous encounter notes.  Weight Summary and Biometrics   Weight Lost Since Last  Visit: 0  Weight Gained Since Last Visit: 9lb    Vitals Temp: 97.6 F (36.4 C) BP: 124/84 Pulse Rate: (!) 108 SpO2: 98 %   Anthropometric Measurements Height: 5' 6.5" (1.689 m) Weight: 262 lb (118.8 kg) BMI (Calculated): 41.66 Weight at Last Visit: 253lb Weight Lost Since Last Visit: 0 Weight Gained Since Last Visit: 9lb Starting Weight: 248lb Total Weight Loss (lbs): 0 lb (0 kg) Peak Weight: 281lb   Body Composition  Body Fat %: 48.7 % Fat Mass (lbs): 127.6 lbs Muscle Mass (lbs): 127.6 lbs Total Body Water (lbs): 98.8 lbs Visceral Fat Rating : 12   Other Clinical Data Fasting: no Labs: no Today's Visit #: 8 Starting Date:  09/27/22    Objective:   PHYSICAL EXAM: Blood pressure 124/84, pulse (!) 108, temperature 97.6 F (36.4 C), height 5' 6.5" (1.689 m), weight 262 lb (118.8 kg), last menstrual period 02/20/2023, SpO2 98%. Body mass index is 41.65 kg/m.  General: she is overweight, cooperative and in no acute distress. PSYCH: Has normal mood, affect and thought process.   HEENT: EOMI, sclerae are anicteric. Lungs: Normal breathing effort, no conversational dyspnea. Extremities: Moves * 4 Neurologic: A and O * 3, good insight  DIAGNOSTIC DATA REVIEWED: BMET    Component Value Date/Time   NA 140 09/27/2022 0853   K 4.4 09/27/2022 0853   CL 103 09/27/2022 0853   CO2 22 09/27/2022 0853   GLUCOSE 86 09/27/2022 0853   GLUCOSE 84 06/06/2022 1020   BUN 9 09/27/2022 0853   CREATININE 0.55 (L) 09/27/2022 0853   CREATININE 0.60 08/02/2012 1440   CALCIUM 9.3 09/27/2022 0853   Lab Results  Component Value Date   HGBA1C 5.3 12/21/2022   HGBA1C 5.4 08/02/2012   Lab Results  Component Value Date   INSULIN 11.8 12/21/2022   INSULIN 13.6 09/27/2022   Lab Results  Component Value Date   TSH 3.530 09/27/2022   CBC    Component Value Date/Time   WBC 6.1 09/27/2022 0853   WBC 7.9 08/04/2022 0732   RBC 4.66 09/27/2022 0853   RBC 4.42 08/04/2022 0732   HGB 12.8 09/27/2022 0853   HCT 40.9 09/27/2022 0853   PLT 285 09/27/2022 0853   MCV 88 09/27/2022 0853   MCH 27.5 09/27/2022 0853   MCHC 31.3 (L) 09/27/2022 0853   MCHC 32.5 08/04/2022 0732   RDW 12.4 09/27/2022 0853   Iron Studies No results found for: "IRON", "TIBC", "FERRITIN", "IRONPCTSAT" Lipid Panel     Component Value Date/Time   CHOL 222 (H) 09/27/2022 0853   TRIG 51 09/27/2022 0853   HDL 103 09/27/2022 0853   CHOLHDL 2 08/01/2021 0950   VLDL 10.8 08/01/2021 0950   LDLCALC 110 (H) 09/27/2022 0853   Hepatic Function Panel     Component Value Date/Time   PROT 6.7 09/27/2022 0853   ALBUMIN 4.3 09/27/2022 0853   AST 17  09/27/2022 0853   ALT 24 09/27/2022 0853   ALKPHOS 93 09/27/2022 0853   BILITOT <0.2 09/27/2022 0853      Component Value Date/Time   TSH 3.530 09/27/2022 0853   Nutritional Lab Results  Component Value Date   VD25OH 27.3 (L) 12/21/2022   VD25OH 20.2 (L) 09/27/2022    Attestations:   Burnett Sheng, acting as a medical scribe for Thomasene Lot, DO., have compiled all relevant documentation for today's office visit on behalf of Thomasene Lot, DO, while in the presence of [employer], DO.  Reviewed by clinician  on day of visit: allergies, medications, problem list, medical history, surgical history, family history, social history, and previous encounter notes pertinent to patient's obesity diagnosis.  I have reviewed the above documentation for accuracy and completeness, and I agree with the above. Kylie Ware, D.O.  The 21st Century Cures Act was signed into law in 2016 which includes the topic of electronic health records.  This provides immediate access to information in MyChart.  This includes consultation notes, operative notes, office notes, lab results and pathology reports.  If you have any questions about what you read please let us know at your next visit so we can discuss your concerns and take corrective action if need be.  We are right here with you.

## 2023-03-23 DIAGNOSIS — F411 Generalized anxiety disorder: Secondary | ICD-10-CM | POA: Diagnosis not present

## 2023-03-23 DIAGNOSIS — F321 Major depressive disorder, single episode, moderate: Secondary | ICD-10-CM | POA: Diagnosis not present

## 2023-03-27 DIAGNOSIS — F411 Generalized anxiety disorder: Secondary | ICD-10-CM | POA: Diagnosis not present

## 2023-03-27 DIAGNOSIS — F321 Major depressive disorder, single episode, moderate: Secondary | ICD-10-CM | POA: Diagnosis not present

## 2023-04-03 DIAGNOSIS — F321 Major depressive disorder, single episode, moderate: Secondary | ICD-10-CM | POA: Diagnosis not present

## 2023-04-03 DIAGNOSIS — F411 Generalized anxiety disorder: Secondary | ICD-10-CM | POA: Diagnosis not present

## 2023-04-06 ENCOUNTER — Other Ambulatory Visit: Payer: Self-pay | Admitting: Family

## 2023-04-10 DIAGNOSIS — F321 Major depressive disorder, single episode, moderate: Secondary | ICD-10-CM | POA: Diagnosis not present

## 2023-04-10 DIAGNOSIS — F411 Generalized anxiety disorder: Secondary | ICD-10-CM | POA: Diagnosis not present

## 2023-04-13 ENCOUNTER — Other Ambulatory Visit: Payer: Self-pay | Admitting: Family

## 2023-04-18 ENCOUNTER — Ambulatory Visit (INDEPENDENT_AMBULATORY_CARE_PROVIDER_SITE_OTHER): Payer: BC Managed Care – PPO | Admitting: Family Medicine

## 2023-04-18 ENCOUNTER — Encounter (INDEPENDENT_AMBULATORY_CARE_PROVIDER_SITE_OTHER): Payer: Self-pay | Admitting: Family Medicine

## 2023-04-18 VITALS — BP 123/82 | HR 96 | Temp 96.0°F | Ht 66.5 in | Wt 261.0 lb

## 2023-04-18 DIAGNOSIS — E559 Vitamin D deficiency, unspecified: Secondary | ICD-10-CM | POA: Diagnosis not present

## 2023-04-18 DIAGNOSIS — E88819 Insulin resistance, unspecified: Secondary | ICD-10-CM

## 2023-04-18 DIAGNOSIS — Z6841 Body Mass Index (BMI) 40.0 and over, adult: Secondary | ICD-10-CM

## 2023-04-18 DIAGNOSIS — E669 Obesity, unspecified: Secondary | ICD-10-CM

## 2023-04-18 DIAGNOSIS — E66812 Obesity, class 2: Secondary | ICD-10-CM

## 2023-04-18 DIAGNOSIS — Z6839 Body mass index (BMI) 39.0-39.9, adult: Secondary | ICD-10-CM

## 2023-04-18 NOTE — Progress Notes (Signed)
 Kylie Ware, D.O.  ABFM, ABOM Specializing in Clinical Bariatric Medicine  Office located at: 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 FOR THE DISEASE OF OBESITY: BMI 39.0-39.9,adult, current 41.5 starting bmi 09/27/22- 39.75 Assessment & Plan: Since last office visit on 03/21/2023, patient's muscle mass has decreased by 0.4 lbs. Fat mass has not changed. Total body water has decreased by 0.6 lbs.  Counseling done on how various foods will affect these numbers and how to maximize success  Total lbs lost to date: +13 lb Total weight loss percentage to date: +5.24%    Recommended Dietary Goals Kylie Ware is currently in the action stage of change. As such, her goal is to continue weight management plan.  She has agreed to: continue current plan   Behavioral Intervention We discussed the following today: increasing lean protein intake to established goals, avoiding skipping meals, and work on tracking and journaling calories using tracking application  Additional resources provided today: None  Evidence-based interventions for health behavior change were utilized today including the discussion of self monitoring techniques, problem-solving barriers and SMART goal setting techniques.   Regarding patient's less desirable eating habits and patterns, we employed the technique of small changes.   Pt will specifically work on: Try to eat your own foods more than foods cooked by friends/coworkers for next visit.    Recommended Physical Activity Goals Kylie Ware has been advised to work up to 150 minutes of moderate intensity aerobic activity a week and strengthening exercises 2-3 times per week for cardiovascular health, weight loss maintenance and preservation of muscle mass.   She has agreed to :  Continue current level of physical activity     Pharmacotherapy We both agreed to : continue with nutritional and behavioral strategies and continue current medication regimen   FOR ASSOCIATED CONDITIONS ADDRESSED TODAY:  Insulin resistance Assessment & Plan: Kylie Ware is taking Metformin 500 mg two tablets two times daily. Pt reports sometimes she will take two tablets in the morning and one tablet in the evening because she does not feel she needs the complete dose. Hunger/cravings well controlled. Tolerating medication well. Pt declines refill at this time. Continue consistent exercise and following prudent nutrition plan. Will recheck BMP and Mg at next OV. Will continue to monitor.    Vitamin D deficiency Assessment & Plan: Kylie Ware is on ERGO 50000 units 1 po q Mon and q Thurs. She reports sometimes forgetting to take supplement in the morning so will take in the evening, but has been otherwise compliant with regimen. Pt declines refill on supplement at this time d/t only receiving refill last week. Continue current supplementation regimen. Will recheck Vit D and B12 levels at next OV. Will continue to monitor condition.    Follow up:   Return in about 6 weeks (around 05/28/2023). She was informed of the importance of frequent follow up visits to maximize her success with intensive lifestyle modifications for her multiple health conditions.  Subjective:   Chief complaint: Obesity Kylie Ware is here to discuss her progress with her obesity treatment plan. She is on the the Category 2 Plan and states she is following her eating plan approximately 75% of the time. She states she is walking 30-60 minutes 7 days per week.  Interval History:  Kylie Ware is here for a follow up office visit.  Since last OV on 03/21/2023, Kylie Ware is down 1 lb. She has been journaling occasionally, but reports it is difficult sometimes because she eats food cooked by others and does not know the ingredients. Kylie Ware reports she sometimes  will not eat until she is hungry, which could be later in the day. Pt endorses snacking on things like yogurt, not drinking many caloric beverages, and focusing on protein. Additionally, Kylie Ware eats out 1-2 times a week.  Pharmacotherapy for weight loss: She is currently taking  Metformin 500 mg two tablets twice daily .   Review of Systems:  Pertinent positives were addressed with patient today.  Reviewed by clinician on day of visit: allergies, medications, problem list, medical history, surgical history, family history, social history, and previous encounter notes.  Weight Summary and Biometrics   Weight Lost Since Last Visit: 1 lb  Weight Gained Since Last Visit: 0   Vitals Temp: (!) 96 F (35.6 C) BP: 123/82 Pulse Rate: 96 SpO2: 100 %   Anthropometric Measurements Height: 5' 6.5" (1.689 m) Weight: 261 lb (118.4 kg) BMI (Calculated): 41.5 Weight at Last Visit: 262 lb Weight Lost Since Last Visit: 1 lb Weight Gained Since Last Visit: 0 Starting Weight: 248 lb Total Weight Loss (lbs): 1 lb (0.454 kg) Peak Weight: 281 lb   Body Composition  Body Fat %: 48.8 % Fat Mass (lbs): 127.6 lbs Muscle Mass (lbs): 127.2 lbs Total Body Water (lbs): 98.2 lbs Visceral Fat Rating : 12   Other Clinical Data Fasting: no Labs: no Today's Visit #: 9 Starting Date: 09/27/22    Objective:   PHYSICAL EXAM: Blood pressure 123/82, pulse 96, temperature (!) 96 F (35.6 C), height 5' 6.5" (1.689 m), weight 261 lb (118.4 kg), last menstrual period 04/18/2023, SpO2 100%. Body mass index is 41.5 kg/m.  General: she is overweight, cooperative and in no acute distress. PSYCH: Has normal mood, affect and thought process.   HEENT: EOMI, sclerae are anicteric. Lungs: Normal breathing effort, no conversational dyspnea. Extremities: Moves * 4 Neurologic: A and O * 3, good insight  DIAGNOSTIC DATA REVIEWED: BMET    Component Value Date/Time   NA 140 09/27/2022 0853   K 4.4  09/27/2022 0853   CL 103 09/27/2022 0853   CO2 22 09/27/2022 0853   GLUCOSE 86 09/27/2022 0853   GLUCOSE 84 06/06/2022 1020   BUN 9 09/27/2022 0853   CREATININE 0.55 (L) 09/27/2022 0853   CREATININE 0.60 08/02/2012 1440   CALCIUM 9.3 09/27/2022 0853   Lab Results  Component Value Date   HGBA1C 5.3 12/21/2022   HGBA1C 5.4 08/02/2012   Lab Results  Component Value Date   INSULIN 11.8 12/21/2022   INSULIN 13.6 09/27/2022   Lab Results  Component Value Date   TSH 3.530 09/27/2022   CBC    Component Value Date/Time   WBC 6.1 09/27/2022 0853   WBC 7.9 08/04/2022 0732   RBC 4.66 09/27/2022 0853   RBC 4.42 08/04/2022 0732   HGB 12.8 09/27/2022 0853   HCT 40.9 09/27/2022 0853   PLT 285 09/27/2022 0853   MCV 88 09/27/2022 0853   MCH 27.5 09/27/2022 0853   MCHC 31.3 (L) 09/27/2022 0853   MCHC 32.5 08/04/2022 0732   RDW 12.4 09/27/2022 0853   Iron Studies No results found for: "IRON", "TIBC", "FERRITIN", "IRONPCTSAT" Lipid Panel     Component Value Date/Time   CHOL 222 (H) 09/27/2022 0853   TRIG 51 09/27/2022 0853   HDL 103 09/27/2022 0853  CHOLHDL 2 08/01/2021 0950   VLDL 10.8 08/01/2021 0950   LDLCALC 110 (H) 09/27/2022 0853   Hepatic Function Panel     Component Value Date/Time   PROT 6.7 09/27/2022 0853   ALBUMIN 4.3 09/27/2022 0853   AST 17 09/27/2022 0853   ALT 24 09/27/2022 0853   ALKPHOS 93 09/27/2022 0853   BILITOT <0.2 09/27/2022 0853      Component Value Date/Time   TSH 3.530 09/27/2022 0853   Nutritional Lab Results  Component Value Date   VD25OH 27.3 (L) 12/21/2022   VD25OH 20.2 (L) 09/27/2022    Attestations:   I, Camryn Mix, acting as a Stage manager for [employer], DO., have compiled all relevant documentation for today's office visit on behalf of Thomasene Lot, DO, while in the presence of [employer], DO.  Reviewed by clinician on day of visit: allergies, medications, problem list, medical history, surgical history,  family history, social history, and previous encounter notes pertinent to patient's obesity diagnosis.  I have reviewed the above documentation for accuracy and completeness, and I agree with the above. Kylie Ware, D.O.  The 21st Century Cures Act was signed into law in 2016 which includes the topic of electronic health records.  This provides immediate access to information in MyChart.  This includes consultation notes, operative notes, office notes, lab results and pathology reports.  If you have any questions about what you read please let us know at your next visit so we can discuss your concerns and take corrective action if need be.  We are right here with you.

## 2023-04-24 ENCOUNTER — Other Ambulatory Visit (INDEPENDENT_AMBULATORY_CARE_PROVIDER_SITE_OTHER): Payer: Self-pay | Admitting: Family Medicine

## 2023-04-24 DIAGNOSIS — E88819 Insulin resistance, unspecified: Secondary | ICD-10-CM

## 2023-04-24 DIAGNOSIS — F321 Major depressive disorder, single episode, moderate: Secondary | ICD-10-CM | POA: Diagnosis not present

## 2023-04-24 DIAGNOSIS — F411 Generalized anxiety disorder: Secondary | ICD-10-CM | POA: Diagnosis not present

## 2023-05-01 DIAGNOSIS — F411 Generalized anxiety disorder: Secondary | ICD-10-CM | POA: Diagnosis not present

## 2023-05-01 DIAGNOSIS — F321 Major depressive disorder, single episode, moderate: Secondary | ICD-10-CM | POA: Diagnosis not present

## 2023-05-08 DIAGNOSIS — F411 Generalized anxiety disorder: Secondary | ICD-10-CM | POA: Diagnosis not present

## 2023-05-08 DIAGNOSIS — F321 Major depressive disorder, single episode, moderate: Secondary | ICD-10-CM | POA: Diagnosis not present

## 2023-05-10 ENCOUNTER — Other Ambulatory Visit (INDEPENDENT_AMBULATORY_CARE_PROVIDER_SITE_OTHER): Payer: Self-pay | Admitting: Family Medicine

## 2023-05-10 DIAGNOSIS — E559 Vitamin D deficiency, unspecified: Secondary | ICD-10-CM

## 2023-05-15 DIAGNOSIS — F321 Major depressive disorder, single episode, moderate: Secondary | ICD-10-CM | POA: Diagnosis not present

## 2023-05-15 DIAGNOSIS — F411 Generalized anxiety disorder: Secondary | ICD-10-CM | POA: Diagnosis not present

## 2023-05-16 ENCOUNTER — Telehealth: Payer: Self-pay | Admitting: Family

## 2023-05-16 ENCOUNTER — Other Ambulatory Visit: Payer: Self-pay | Admitting: Family

## 2023-05-16 DIAGNOSIS — F32A Depression, unspecified: Secondary | ICD-10-CM

## 2023-05-16 NOTE — Telephone Encounter (Signed)
 Please contact pt to schedule a follow up visit.

## 2023-05-16 NOTE — Telephone Encounter (Signed)
Lvm 2 schedule.

## 2023-05-28 ENCOUNTER — Ambulatory Visit: Admitting: Family Medicine

## 2023-05-28 ENCOUNTER — Encounter: Payer: Self-pay | Admitting: Family Medicine

## 2023-05-28 VITALS — BP 98/56 | HR 95 | Temp 97.9°F | Ht 66.5 in | Wt 261.0 lb

## 2023-05-28 DIAGNOSIS — F32A Depression, unspecified: Secondary | ICD-10-CM

## 2023-05-28 DIAGNOSIS — F419 Anxiety disorder, unspecified: Secondary | ICD-10-CM | POA: Diagnosis not present

## 2023-05-28 DIAGNOSIS — E559 Vitamin D deficiency, unspecified: Secondary | ICD-10-CM

## 2023-05-28 DIAGNOSIS — Z6841 Body Mass Index (BMI) 40.0 and over, adult: Secondary | ICD-10-CM

## 2023-05-28 DIAGNOSIS — E88819 Insulin resistance, unspecified: Secondary | ICD-10-CM

## 2023-05-28 DIAGNOSIS — E66813 Obesity, class 3: Secondary | ICD-10-CM

## 2023-05-28 MED ORDER — LOMAIRA 8 MG PO TABS: ORAL_TABLET | ORAL | 0 refills | Status: DC

## 2023-05-28 MED ORDER — METFORMIN HCL 500 MG PO TABS: 500.0000 mg | ORAL_TABLET | Freq: Two times a day (BID) | ORAL | Status: DC

## 2023-05-28 NOTE — Progress Notes (Signed)
 Office: [phone]  /  Fax: [phone]  WEIGHT SUMMARY AND BIOMETRICS  Starting Date: 09/27/22  Starting Weight: 248 lb   Weight Lost Since Last Visit: 0   Vitals Temp: 97.9 F (36.6 C) BP: (!) 98/56 Pulse Rate: 95 SpO2: 100 %   Body Composition  Body Fat %: 47 % Fat Mass (lbs): 123 lbs Muscle Mass (lbs): 131.6 lbs Total Body Water (lbs): 95.2 lbs Visceral Fat Rating : 12    HPI  Chief Complaint: OBESITY  Kylie Ware is here to discuss her progress with her obesity treatment plan. She is on the the Category 2 Plan and states she is following her eating plan approximately 75 % of the time. She states she is walking 10,000-15,000 steps  5 times per week.  Interval History:  Since last office visit she is down 0 lb Her net weight gain is 13 lb in the past 8 mos; previously seen by Dr Carolene Chute She is typically skipping breakfast and overeating at night She starts her day at noon with a protein shake- (protein powder + greek yogurt + almond milk) She typically as a snack around 4 pm esp with stress at work She has tried bringing a protein bar or skinny pop to work She lacks a lot of support at home with her roommate and has been 'too lazy' to cook at home  Pharmacotherapy: metformin  500 mg bid   PHYSICAL EXAM:  Blood pressure (!) 98/56, pulse 95, temperature 97.9 F (36.6 C), height 5' 6.5" (1.689 m), weight 261 lb (118.4 kg), last menstrual period 05/23/2023, SpO2 100%. Body mass index is 41.5 kg/m.  General: She is overweight, cooperative, alert, well developed, and in no acute distress. PSYCH: Has normal mood, affect and thought process.   Lungs: Normal breathing effort, no conversational dyspnea.   ASSESSMENT AND PLAN  TREATMENT PLAN FOR OBESITY:  Recommended Dietary Goals  Kylie Ware is currently in the action stage of change. As such, her goal is to continue weight management plan. She has agreed to keeping a food journal and adhering to recommended  goals of 1600 calories and 110 g of  protein.  Behavioral Intervention  We discussed the following Behavioral Modification Strategies today: increasing lean protein intake to established goals, increasing fiber rich foods, increasing water intake , work on tracking and journaling calories using tracking application, keeping healthy foods at home, practice mindfulness eating and understand the difference between hunger signals and cravings, work on managing stress, creating time for self-care and relaxation, avoiding temptations and identifying enticing environmental cues, and continue to work on maintaining a reduced calorie state, getting the recommended amount of protein, incorporating whole foods, making healthy choices, staying well hydrated and practicing mindfulness when eating..  Additional resources provided today: NA  Recommended Physical Activity Goals  Kylie Ware has been advised to work up to 150 minutes of moderate intensity aerobic activity a week and strengthening exercises 2-3 times per week for cardiovascular health, weight loss maintenance and preservation of muscle mass.   She has agreed to Think about enjoyable ways to increase daily physical activity and overcoming barriers to exercise and Increase physical activity in their day and reduce sedentary time (increase NEAT).  Pharmacotherapy changes for the treatment of obesity: begin Lomaira  8 mg bid Avoid pregnancy while on Lomaira  (abstinence, condoms chosen) PDMP reviewed Informed consent signed Reviewed MOA and common adverse SE  ASSOCIATED CONDITIONS ADDRESSED TODAY  Insulin  resistance She has been taking metformin  500 mg twice daily without adverse side effects.  She has failed weight loss in the past 8 months of medically supervised weight management.  She has her improvement with walking.  She has been mindful of her intake of sugar and starches.  Update labs today.  Recommend tracking of daily calorie intake using the  my fitness pal app.  Read labels on food and drink for added sugar, avoiding products with over 8 g of sugar sugar per serving. -     Vitamin B12 -     Magnesium  -     Basic metabolic panel with GFR  Class 3 severe obesity due to excess calories with body mass index (BMI) of 40.0 to 44.9 in adult, unspecified whether serious comorbidity present (HCC) -     Lomaira ; 1 tab po 30 min before breakfast and 1 tab po at 2 pm  Dispense: 60 tablet; Refill: 0 Patient is a good candidate for use of Lomaira  to aid in obesity management.  She has never used an antiobesity medication.  Her blood pressure and heart rate are at goal.  She has failed to see weight loss and in fact has gained 30 pounds in the past 8 months, struggling with hunger and cravings.  She is motivated to continue working on dietary tracking and increasing walking time.  In review of her chart, she is on the following weight gaining medication: Amitriptyline  25 mg, 2 tabs at bedtime for sleep  Vitamin D  deficiency Last vitamin D  Lab Results  Component Value Date   VD25OH 27.3 (L) 12/21/2022   She has been taking vitamin D  50,000 IU twice weekly.  She is overdue for vitamin D  level today.  Will draw. -     VITAMIN D  25 Hydroxy (Vit-D Deficiency, Fractures)  Anxiety and depression Reports stable mood using Lexapro  10 mg once daily.  He has a good support system at home but has been under a lot of stress with work. Other orders -     metFORMIN  HCl; Take 1 tablet (500 mg total) by mouth 2 (two) times daily with a meal.      She was informed of the importance of frequent follow up visits to maximize her success with intensive lifestyle modifications for her multiple health conditions.   ATTESTASTION STATEMENTS:  Reviewed by clinician on day of visit: allergies, medications, problem list, medical history, surgical history, family history, social history, and previous encounter notes pertinent to obesity diagnosis.   I have  personally spent 32 minutes total time today in preparation, patient care, nutritional counseling and education,  and documentation for this visit, including the following: review of most recent clinical lab tests, prescribing medications/ refilling medications, reviewing medical assistant documentation, review and interpretation of bioimpedence results.     Micky Albee, D.O. DABFM, DABOM [facility] [address] [place], [place] [address] [phone]

## 2023-05-28 NOTE — Patient Instructions (Signed)
 Stay on metformin  500 mg twice a day  ADD Lomaira  8 mg tab for appetite control Take 1 tab 30 min before breakfast and 1 tab at 2 pm daily  Labs today Will send results to Mychart tomorrow  Add in breakfast: Jaclyn Massy Delites breakfast sandwich OR protein shake ([employer] Power or Fairlife) + one piece of fruit (protein coffee -- unsweet cold brew + shake OR shot of espresso + a protein shake)  Add a fruit (frozen preferred) to shake at lunch You an also add chia seeds or flaxseesds for fiber  Limiting dinners out to 2 x a week Think of easy dinners to make at home Prewashed greens, microwave veggies, Bare Nuggets, Rotisserie, chicken sausage OK to have one carb with dinner   Barilla protein pasta Banza Chickpea pasta

## 2023-05-29 ENCOUNTER — Telehealth: Payer: Self-pay

## 2023-05-29 DIAGNOSIS — F411 Generalized anxiety disorder: Secondary | ICD-10-CM | POA: Diagnosis not present

## 2023-05-29 DIAGNOSIS — F321 Major depressive disorder, single episode, moderate: Secondary | ICD-10-CM | POA: Diagnosis not present

## 2023-05-29 NOTE — Telephone Encounter (Signed)
 PA for Lomaira  has been denied. PA is now complete.     Denied today by [facility] [place] [employer] 2017 Denied. This health benefit plan does not cover the following services, supplies, drugs or charges: Any treatment or regimen, medical or surgical, for the purpose of reducing or controlling the weight of the member, or for the treatment of obesity, except for surgical treatment of morbid obesity, or as specifically covered by this health benefit plan.

## 2023-05-30 LAB — BASIC METABOLIC PANEL WITH GFR
BUN/Creatinine Ratio: 13 (ref 9–23)
BUN: 9 mg/dL (ref 6–20)
CO2: 23 mmol/L (ref 20–29)
Calcium: 9.7 mg/dL (ref 8.7–10.2)
Chloride: 104 mmol/L (ref 96–106)
Creatinine, Ser: 0.67 mg/dL (ref 0.57–1.00)
Glucose: 83 mg/dL (ref 70–99)
Potassium: 4.3 mmol/L (ref 3.5–5.2)
Sodium: 141 mmol/L (ref 134–144)
eGFR: 122 mL/min/{1.73_m2} (ref >59)

## 2023-05-30 LAB — MAGNESIUM: Magnesium: 2.1 mg/dL (ref 1.6–2.3)

## 2023-05-30 LAB — VITAMIN D 25 HYDROXY (VIT D DEFICIENCY, FRACTURES): Vit D, 25-Hydroxy: 44.9 ng/mL (ref 30.0–100.0)

## 2023-05-30 LAB — VITAMIN B12: Vitamin B-12: 557 pg/mL (ref 232–1245)

## 2023-06-05 DIAGNOSIS — F321 Major depressive disorder, single episode, moderate: Secondary | ICD-10-CM | POA: Diagnosis not present

## 2023-06-05 DIAGNOSIS — F411 Generalized anxiety disorder: Secondary | ICD-10-CM | POA: Diagnosis not present

## 2023-06-12 DIAGNOSIS — F321 Major depressive disorder, single episode, moderate: Secondary | ICD-10-CM | POA: Diagnosis not present

## 2023-06-12 DIAGNOSIS — F411 Generalized anxiety disorder: Secondary | ICD-10-CM | POA: Diagnosis not present

## 2023-06-19 DIAGNOSIS — F411 Generalized anxiety disorder: Secondary | ICD-10-CM | POA: Diagnosis not present

## 2023-06-19 DIAGNOSIS — F321 Major depressive disorder, single episode, moderate: Secondary | ICD-10-CM | POA: Diagnosis not present

## 2023-06-26 DIAGNOSIS — F321 Major depressive disorder, single episode, moderate: Secondary | ICD-10-CM | POA: Diagnosis not present

## 2023-06-26 DIAGNOSIS — F411 Generalized anxiety disorder: Secondary | ICD-10-CM | POA: Diagnosis not present

## 2023-06-27 ENCOUNTER — Ambulatory Visit: Admitting: Family Medicine

## 2023-06-27 ENCOUNTER — Encounter: Payer: Self-pay | Admitting: Family Medicine

## 2023-06-27 VITALS — BP 128/90 | HR 95 | Temp 98.0°F | Ht 66.5 in | Wt 258.0 lb

## 2023-06-27 DIAGNOSIS — E559 Vitamin D deficiency, unspecified: Secondary | ICD-10-CM

## 2023-06-27 DIAGNOSIS — F32A Depression, unspecified: Secondary | ICD-10-CM

## 2023-06-27 DIAGNOSIS — F419 Anxiety disorder, unspecified: Secondary | ICD-10-CM | POA: Diagnosis not present

## 2023-06-27 DIAGNOSIS — E66813 Obesity, class 3: Secondary | ICD-10-CM

## 2023-06-27 DIAGNOSIS — E88819 Insulin resistance, unspecified: Secondary | ICD-10-CM

## 2023-06-27 DIAGNOSIS — Z6841 Body Mass Index (BMI) 40.0 and over, adult: Secondary | ICD-10-CM

## 2023-06-27 MED ORDER — LOMAIRA 8 MG PO TABS: ORAL_TABLET | ORAL | 0 refills | Status: DC

## 2023-06-27 MED ORDER — VITAMIN D (ERGOCALCIFEROL) 1.25 MG (50000 UNIT) PO CAPS: ORAL_CAPSULE | ORAL | 0 refills | Status: DC

## 2023-06-27 NOTE — Progress Notes (Signed)
 Office: [phone]  /  Fax: [phone]  WEIGHT SUMMARY AND BIOMETRICS  Starting Date: 09/27/22  Starting Weight: 248lb   Weight Lost Since Last Visit: 3lb   Vitals Temp: 98 F (36.7 C) BP: (!) 128/90 Pulse Rate: 95 SpO2: 97 %   Body Composition  Body Fat %: 47.5 % Fat Mass (lbs): 123 lbs Muscle Mass (lbs): 129 lbs Total Body Water (lbs): 96.8 lbs Visceral Fat Rating : 12    HPI  Chief Complaint: OBESITY  Kylie Ware is here to discuss her progress with her obesity treatment plan. She is on the keeping a food journal and adhering to recommended goals of 1600 calories and 110 protein and states she is following her eating plan approximately 75 % of the time. She states she is exercising 30 minutes 1-2 times per week.  Interval History:  Since last office visit she is down 3 lb She is down 2.6 lb of muscle mass and down 0 lb of body fat She did start on Lomaira  8 mg twice daily for appetite control and this is helping She is working more on meal planning with her roommate She has been able to reduce portion sizes and snacking She is not craving as many sweets She plans to add in more walking Denies meal skipping   Pharmacotherapy: metformin  500 mg bid + Lomaira  8 mg bid She is on the following weight - promoting medications by PCP: Amitriptyline  25 mg at bedtime prn sleep   PHYSICAL EXAM:  Blood pressure (!) 128/90, pulse 95, temperature 98 F (36.7 C), height 5' 6.5" (1.689 m), weight 258 lb (117 kg), last menstrual period 05/23/2023, SpO2 97%. Body mass index is 41.02 kg/m.  General: She is overweight, cooperative, alert, well developed, and in no acute distress. PSYCH: Has normal mood, affect and thought process.   Lungs: Normal breathing effort, no conversational dyspnea.   ASSESSMENT AND PLAN  TREATMENT PLAN FOR OBESITY:  Recommended Dietary Goals  Kylie Ware is currently in the action stage of change. As such, her goal is to continue weight  management plan. She has agreed to keeping a food journal and adhering to recommended goals of 1600 calories and 110 g of  protein.  Behavioral Intervention  We discussed the following Behavioral Modification Strategies today: increasing lean protein intake to established goals, increasing fiber rich foods, increasing water intake , work on meal planning and preparation, work on tracking and journaling calories using tracking application, keeping healthy foods at home, decreasing eating out or consumption of processed foods, and making healthy choices when eating convenient foods, practice mindfulness eating and understand the difference between hunger signals and cravings, work on managing stress, creating time for self-care and relaxation, avoiding temptations and identifying enticing environmental cues, continue to work on implementation of reduced calorie nutritional plan, and continue to work on maintaining a reduced calorie state, getting the recommended amount of protein, incorporating whole foods, making healthy choices, staying well hydrated and practicing mindfulness when eating.. Dining out guide given Recipe handout given  Additional resources provided today: NA  Recommended Physical Activity Goals  [name] has been advised to work up to 150 minutes of moderate intensity aerobic activity a week and strengthening exercises 2-3 times per week for cardiovascular health, weight loss maintenance and preservation of muscle mass.   She has agreed to Start aerobic activity with a goal of 150 minutes a week at moderate intensity.   Pharmacotherapy changes for the treatment of obesity: none  ASSOCIATED CONDITIONS ADDRESSED TODAY  Insulin  resistance She is doing well on metformin  500 mg bid Reviewed BMP, B12 results with patient from last visit, WNL Continue current dose of metformin  + active plan for weight reduction Ramp up walking time to 30 min daily  Class 3 severe obesity due to  excess calories with body mass index (BMI) of 40.0 to 44.9 in adult, unspecified whether serious comorbidity present -     Lomaira ; 1 tab po 30 min before breakfast and 1 tab po at 2 pm  Dispense: 60 tablet; Refill: 0 DBP is 90 today but has previously been low - normal. Continue Lomaira  as the benefit exceeds the risk   Vitamin D  deficiency -     Vitamin D  (Ergocalciferol ); 1 po q Mon and q Thurs  Dispense: 10 capsule; Refill: 0 Last vitamin D  Lab Results  Component Value Date   VD25OH 44.9 05/28/2023   Improving. Reviewed vitamin D  lab with patient. She has been taking RX vitamin D  weekly Continue RX vitamin D  weekly w/ a goal >50 and repeat lab in 3 mos  Anxiety and depression Well controlled on Lexapro  10 mg daily and Amitriptyline  50 mg at bedtime for sleep Has a good support at home and emotional eating is under better control now     She was informed of the importance of frequent follow up visits to maximize her success with intensive lifestyle modifications for her multiple health conditions.   ATTESTASTION STATEMENTS:  Reviewed by clinician on day of visit: allergies, medications, problem list, medical history, surgical history, family history, social history, and previous encounter notes pertinent to obesity diagnosis.   I have personally spent 30 minutes total time today in preparation, patient care, nutritional counseling and education,  and documentation for this visit, including the following: review of most recent clinical lab tests, prescribing medications/ refilling medications, reviewing medical assistant documentation, review and interpretation of bioimpedence results.     Micky Albee, D.O. DABFM, DABOM [facility] [address] [place], [place] [address] [phone]

## 2023-07-03 DIAGNOSIS — F321 Major depressive disorder, single episode, moderate: Secondary | ICD-10-CM | POA: Diagnosis not present

## 2023-07-03 DIAGNOSIS — F411 Generalized anxiety disorder: Secondary | ICD-10-CM | POA: Diagnosis not present

## 2023-07-10 DIAGNOSIS — F321 Major depressive disorder, single episode, moderate: Secondary | ICD-10-CM | POA: Diagnosis not present

## 2023-07-10 DIAGNOSIS — F411 Generalized anxiety disorder: Secondary | ICD-10-CM | POA: Diagnosis not present

## 2023-07-30 ENCOUNTER — Encounter: Payer: Self-pay | Admitting: Family Medicine

## 2023-07-30 ENCOUNTER — Ambulatory Visit: Admitting: Family Medicine

## 2023-07-30 ENCOUNTER — Other Ambulatory Visit: Payer: Self-pay | Admitting: Family Medicine

## 2023-07-30 VITALS — BP 122/88 | HR 102 | Temp 98.2°F | Ht 66.5 in | Wt 258.0 lb

## 2023-07-30 DIAGNOSIS — F419 Anxiety disorder, unspecified: Secondary | ICD-10-CM | POA: Diagnosis not present

## 2023-07-30 DIAGNOSIS — E559 Vitamin D deficiency, unspecified: Secondary | ICD-10-CM

## 2023-07-30 DIAGNOSIS — Z6841 Body Mass Index (BMI) 40.0 and over, adult: Secondary | ICD-10-CM

## 2023-07-30 DIAGNOSIS — E66813 Obesity, class 3: Secondary | ICD-10-CM

## 2023-07-30 DIAGNOSIS — E88819 Insulin resistance, unspecified: Secondary | ICD-10-CM | POA: Diagnosis not present

## 2023-07-30 DIAGNOSIS — F32A Depression, unspecified: Secondary | ICD-10-CM | POA: Diagnosis not present

## 2023-07-30 MED ORDER — CONTRAVE 8-90 MG PO TB12: ORAL_TABLET | ORAL | 0 refills | Status: DC

## 2023-07-30 MED ORDER — VITAMIN D (ERGOCALCIFEROL) 1.25 MG (50000 UNIT) PO CAPS
ORAL_CAPSULE | ORAL | 0 refills | Status: DC
Start: 2023-07-30 — End: 2023-10-30

## 2023-07-30 MED ORDER — METFORMIN HCL 500 MG PO TABS: 500.0000 mg | ORAL_TABLET | Freq: Two times a day (BID) | ORAL | Status: DC

## 2023-07-30 NOTE — Progress Notes (Signed)
 Office: [phone]  /  Fax: [phone]  WEIGHT SUMMARY AND BIOMETRICS  Starting Date: 09/27/22  Starting Weight: 248lb   Weight Lost Since Last Visit: 0lb   Vitals Temp: 98.2 F (36.8 C) BP: 122/88 Pulse Rate: (!) 102 SpO2: 96 %   Body Composition  Body Fat %: 47.2 % Fat Mass (lbs): 121.8 lbs Muscle Mass (lbs): 129.4 lbs Total Body Water (lbs): 96.6 lbs Visceral Fat Rating : 12     HPI  Chief Complaint: OBESITY  Kylie Ware is here to discuss her progress with her obesity treatment plan. She is on the keeping a food journal and adhering to recommended goals of 1600 calories and 110 protein and states she is following her eating plan approximately 75 % of the time. She states she is exercising 0 minutes 0 times per week.  Interval History:  Since last office visit she is down 0 lb This gives her a net weight gain of 10 lb in 10 mos She is still taking Lomaira  8 mg bid She is tolerating metformin  500 mg bid She is trying to track her calories and is sometimes under 1600 cal/ day She lacks a big appetite when stressed and work stress has been high She either skips meals or makes bad food choice She is lacking a great support at home She anticipates work stress to improve She has a Photographer but isn't going  Pharmacotherapy: Lomaira  8 mg bid + metformin  500 mg bid   PHYSICAL EXAM:  Blood pressure 122/88, pulse (!) 102, temperature 98.2 F (36.8 C), height 5' 6.5 (1.689 m), weight 258 lb (117 kg), SpO2 96%. Body mass index is 41.02 kg/m.  General: She is overweight, cooperative, alert, well developed, and in no acute distress. PSYCH: Has normal mood, affect and thought process.   Lungs: Normal breathing effort, no conversational dyspnea.   ASSESSMENT AND PLAN  TREATMENT PLAN FOR OBESITY:  Recommended Dietary Goals  Lucillia is currently in the action stage of change. As such, her goal is to continue weight management plan. She has agreed to  the Category 3 Plan and keeping a food journal and adhering to recommended goals of 1600 calories and 100 g of  protein.  Behavioral Intervention  We discussed the following Behavioral Modification Strategies today: increasing lean protein intake to established goals, increasing fiber rich foods, avoiding skipping meals, increasing water intake , work on tracking and journaling calories using tracking application, keeping healthy foods at home, practice mindfulness eating and understand the difference between hunger signals and cravings, work on managing stress, creating time for self-care and relaxation, planning for success, and continue to work on maintaining a reduced calorie state, getting the recommended amount of protein, incorporating whole foods, making healthy choices, staying well hydrated and practicing mindfulness when eating..  Additional resources provided today: NA  Recommended Physical Activity Goals  Afrika has been advised to work up to 150 minutes of moderate intensity aerobic activity a week and strengthening exercises 2-3 times per week for cardiovascular health, weight loss maintenance and preservation of muscle mass.   She has agreed to Start aerobic activity with a goal of 150 minutes a week at moderate intensity.   Pharmacotherapy changes for the treatment of obesity: discontinue Lomaira ; begin Contrave ramping dose Contrave Discussed mechanism of action and potential adverse SEs. Pt denies a hx of seizure disorder or use of opoid pain medication. Use along with reduced kcal diet and regular exercise.  ASSOCIATED CONDITIONS ADDRESSED TODAY  Anxiety and  depression Taking Lexapro  10 mg daily.  She has a stable home life but work stress has been higher, needing amitriptyline  at bedtime for sleep.  Continue current plan of care, working on sleep, self care, good nutrition and regular exercise. Watch for worsening anxiety with the addition of Contrave  Vitamin D   deficiency Last vitamin D  Lab Results  Component Value Date   VD25OH 44.9 05/28/2023  She reduced her RX vitamin D  from 2 x a week to 1x a week Repeat labs in 2 mos -     Vitamin D  (Ergocalciferol ); 1 capsule po once weekly  Dispense: 5 capsule; Refill: 0  Insulin  resistance Doing well on metformin  500 mg bid, doing well without adverse SE Has room for improvement with diet and exercise BMP, B12, fasting insulin  due in 1-2 mos Ramp up walking time Continue prescribed diet -     metFORMIN  HCl; Take 1 tablet (500 mg total) by mouth 2 (two) times daily with a meal.  Class 3 severe obesity due to excess calories with body mass index (BMI) of 40.0 to 44.9 in adult, unspecified whether serious comorbidity present  -     Contrave; Start 1 tablet every morning for 7 days, then 1 tablet twice daily for 7 days, then 2 tablets every morning and one every evening  Dispense: 70 tablet; Refill: 0      She was informed of the importance of frequent follow up visits to maximize her success with intensive lifestyle modifications for her multiple health conditions.   ATTESTASTION STATEMENTS:  Reviewed by clinician on day of visit: allergies, medications, problem list, medical history, surgical history, family history, social history, and previous encounter notes pertinent to obesity diagnosis.   I have personally spent 30 minutes total time today in preparation, patient care, nutritional counseling and education,  and documentation for this visit, including the following: review of most recent clinical lab tests, prescribing medications/ refilling medications, reviewing medical assistant documentation, review and interpretation of bioimpedence results.     Darice Haddock, D.O. DABFM, DABOM [facility] [address] [place], [place] [address] [phone]

## 2023-07-30 NOTE — Patient Instructions (Signed)
 Stop Lomaira    Start Contrave ramping dose  Call if any problems or questions  Continue metformin  500 mg twice daily  Continue RX vitamin D  weekly  Aim for a good 30 min of walking 3 days/ wk  Keep calories 1600 / day which should include 100 g of protein daily

## 2023-08-03 ENCOUNTER — Other Ambulatory Visit: Payer: Self-pay | Admitting: Family Medicine

## 2023-08-03 DIAGNOSIS — E559 Vitamin D deficiency, unspecified: Secondary | ICD-10-CM

## 2023-08-06 ENCOUNTER — Telehealth: Payer: Self-pay | Admitting: *Deleted

## 2023-08-06 DIAGNOSIS — Z6841 Body Mass Index (BMI) 40.0 and over, adult: Secondary | ICD-10-CM

## 2023-08-06 NOTE — Telephone Encounter (Signed)
 Prior authorization done via cover my meds for patients Contrave. Waiting on determination.

## 2023-08-07 ENCOUNTER — Ambulatory Visit (INDEPENDENT_AMBULATORY_CARE_PROVIDER_SITE_OTHER): Admitting: Family

## 2023-08-07 ENCOUNTER — Encounter: Payer: Self-pay | Admitting: Family

## 2023-08-07 VITALS — BP 122/77 | HR 98 | Temp 98.2°F | Resp 16 | Ht 66.0 in | Wt 267.0 lb

## 2023-08-07 DIAGNOSIS — G4700 Insomnia, unspecified: Secondary | ICD-10-CM

## 2023-08-07 DIAGNOSIS — G43009 Migraine without aura, not intractable, without status migrainosus: Secondary | ICD-10-CM

## 2023-08-07 DIAGNOSIS — Z0000 Encounter for general adult medical examination without abnormal findings: Secondary | ICD-10-CM

## 2023-08-07 DIAGNOSIS — E88819 Insulin resistance, unspecified: Secondary | ICD-10-CM

## 2023-08-07 DIAGNOSIS — F32A Depression, unspecified: Secondary | ICD-10-CM

## 2023-08-07 DIAGNOSIS — F411 Generalized anxiety disorder: Secondary | ICD-10-CM | POA: Diagnosis not present

## 2023-08-07 DIAGNOSIS — F419 Anxiety disorder, unspecified: Secondary | ICD-10-CM

## 2023-08-07 DIAGNOSIS — F321 Major depressive disorder, single episode, moderate: Secondary | ICD-10-CM | POA: Diagnosis not present

## 2023-08-07 MED ORDER — SUMATRIPTAN SUCCINATE 50 MG PO TABS: ORAL_TABLET | ORAL | 6 refills | Status: AC

## 2023-08-07 NOTE — Assessment & Plan Note (Signed)
 Only fair control. Continues counseling and lexapro  10mg . Offered to increase lexapro  dose- she declines at this time.

## 2023-08-07 NOTE — Assessment & Plan Note (Signed)
 Out of imitrex . Will refill.  She is getting about 5 migraines/month.  Continues Elavil .  Declines referral to Neurology.

## 2023-08-07 NOTE — Progress Notes (Signed)
 Subjective:     Patient ID: Kylie Ware, female    DOB: 1995-12-06, 28 y.o.   MRN: [id number]  Chief Complaint  Patient presents with   Annual Exam    HPI  Discussed the use of AI scribe software for clinical note transcription with the patient, who gave verbal consent to proceed.  History of Present Illness  Kylie Ware is a 28 year old female who presents for an update on her physicals and management of stress and migraines.  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 She is currently taking Lexapro  10 mg and feels comfortable with this dose. She is engaged in counseling sessions, with one scheduled immediately after this visit.  Her migraines have increased in frequency to several times a month. She had a severe migraine the previous night but was out of Imitrex , which she typically uses for relief. She takes amitriptyline , two tablets every night, which helps her sleep well.  She has not started taking Contrave  due to its cost and is focusing on diet and exercise instead. Her exercise routine is limited due to the hot weather, despite having access to a gym.  She occasionally experiences digestive issues, including severe cramping and diarrhea, which she suspects might be related to certain foods, particularly [place] food. No consistent link to dairy is noted.  No cough, cold, skin concerns, hearing, or vision issues. No swelling in legs, urinary concerns, or unusual muscle or joint pain. Periods are normal. She is current with dental check-ups but overdue for an eye exam.  Immunizations: up to date Diet: working on diet Wt Readings from Last 3 Encounters:  08/07/23 267 lb (121.1 kg)  07/30/23 258 lb (117 kg)  06/27/23 258 lb (117 kg)  Exercise: not exercising regularly Pap Smear:  Neg 7/23 Vision: due Dental: up to  date     Health Maintenance Due  Topic Date Due   COVID-19 Vaccine (1 - 2024-25 season) Never done    Past Medical History:  Diagnosis Date   Ankle pain    Anxiety    Back pain    pt states d/t slipped disc   Back pain    Depression    Displaced bimalleolar fracture of left lower leg, initial encounter for closed fracture 05/15/2019   Displaced bimalleolar fracture of left lower leg, subsequent encounter for closed fracture with delayed healing 09/04/2019   Edema    Joint pain    Migraines    SOB (shortness of breath) on exertion     Past Surgical History:  Procedure Laterality Date   ORIF ANKLE FRACTURE Left 05/15/2019   Procedure: OPEN REDUCTION INTERNAL FIXATION (ORIF) LEFT BIMALLEOLAR ANKLE AND SYNDESMOSIS;  Surgeon: Jerri Kay HERO, MD;  Location: [facility];  Service: Orthopedics;  Laterality: Left;   WISDOM TOOTH EXTRACTION      Family History  Problem Relation Age of Onset   Cancer Mother 50       breast   Hypertension Mother    Diabetes Father 65       type 110   Aortic stenosis Father 60       had AVR in his 28's   Depression Father    Hypertension Father    Anxiety disorder Father    Liver disease Father    Sleep apnea Father    Obesity Father    Osteogenesis  imperfecta Brother    Depression Brother    Stroke Paternal Uncle    Heart failure Paternal Uncle    Cancer Maternal Grandmother 73       breast CA   Glaucoma Maternal Grandfather    Heart disease Maternal Grandfather        cad   Cancer Maternal Grandfather        unsure type   Alcoholism Paternal Grandmother    Emphysema Paternal Grandmother    CVA Paternal Grandfather    Dementia Paternal Grandfather     Social History   Socioeconomic History   Marital status: Single    Spouse name: Not on file   Number of children: Not on file   Years of education: Not on file   Highest education level: Bachelor's degree (e.g., BA, AB, BS)  Occupational History   Not on file   Tobacco Use   Smoking status: Never   Smokeless tobacco: Never  Vaping Use   Vaping status: Never Used  Substance and Sexual Activity   Alcohol use: No    Alcohol/week: 0.0 standard drinks of alcohol   Drug use: No   Sexual activity: Not Currently  Other Topics Concern   Not on file  Social History Narrative   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   Completed a bachelor's degree   Enjoys reading, video games, [place]   No pets   Social Drivers of Health   Financial Resource Strain: Low Risk  (08/02/2023)   Overall Financial Resource Strain (CARDIA)    Difficulty of Paying Living Expenses: Not very hard  Food Insecurity: No Food Insecurity (08/02/2023)   Hunger Vital Sign    Worried About Running Out of Food in the Last Year: Never true    Ran Out of Food in the Last Year: Never true  Transportation Needs: No Transportation Needs (08/02/2023)   PRAPARE - Administrator, Civil Service (Medical): No    Lack of Transportation (Non-Medical): No  Physical Activity: Insufficiently Active (08/02/2023)   Exercise Vital Sign    Days of Exercise per Week: 2 days    Minutes of Exercise per Session: 40 min  Stress: Stress Concern Present (08/02/2023)   [employer] of Occupational Health - Occupational Stress Questionnaire    Feeling of Stress: Rather much  Social Connections: Moderately Integrated (08/02/2023)   Social Connection and Isolation Panel    Frequency of Communication with Friends and Family: Twice a week    Frequency of Social Gatherings with Friends and Family: Twice a week    Attends Religious Services: More than 4 times per year    Active Member of [employer] or Organizations: Yes    Attends Banker Meetings: More than 4 times per year    Marital Status: Never married  Intimate Partner Violence: Unknown (05/23/2022)   Received from [facility]   HITS    Physically Hurt: Not on file    Insult or Talk Down To: Not  on file    Threaten Physical Harm: Not on file    Scream or Curse: Not on file    Outpatient Medications Prior to Visit  Medication Sig Dispense Refill   amitriptyline  (ELAVIL ) 25 MG tablet TAKE 2 TABLETS (50 MG TOTAL) BY MOUTH AT BEDTIME AS NEEDED FOR SLEEP. 180 tablet 0   escitalopram  (LEXAPRO ) 10 MG tablet TAKE 1 TABLET BY MOUTH EVERY DAY 90 tablet 0   ibuprofen (  ADVIL) 200 MG tablet Take 200 mg by mouth every 6 (six) hours as needed.     metFORMIN  (GLUCOPHAGE ) 500 MG tablet Take 1 tablet (500 mg total) by mouth 2 (two) times daily with a meal.     Naltrexone-buPROPion HCl ER (CONTRAVE ) 8-90 MG TB12 Start 1 tablet every morning for 7 days, then 1 tablet twice daily for 7 days, then 2 tablets every morning and one every evening 70 tablet 0   Vitamin D , Ergocalciferol , (DRISDOL ) 1.25 MG (50000 UNIT) CAPS capsule 1 capsule po once weekly 5 capsule 0   SUMAtriptan  (IMITREX ) 50 MG tablet 1 TABLET BY MOUTH AT START OF MIGRAINE, MAY REPEAT IN 2 HOURS IF NEEDED (MAX 2 TABS/24 HRS) 9 tablet 6   No facility-administered medications prior to visit.    No Known Allergies  Review of Systems  Constitutional:  Negative for weight loss.  HENT:  Negative for congestion and hearing loss.   Eyes:  Negative for blurred vision.  Respiratory:  Negative for cough.   Cardiovascular:  Negative for leg swelling.  Gastrointestinal:  Negative for constipation and diarrhea.  Genitourinary:  Negative for dysuria and frequency.  Musculoskeletal:  Negative for joint pain and myalgias.  Skin:  Negative for rash.  Neurological:  Positive for headaches.  Psychiatric/Behavioral:         + anxiety and depression       Objective:    Physical Exam   BP 122/77 (BP Location: Right Arm, Patient Position: Sitting, Cuff Size: Large)   Pulse 98   Temp 98.2 F (36.8 C) (Oral)   Resp 16   Ht 5' 6 (1.676 m)   Wt 267 lb (121.1 kg)   SpO2 98%   BMI 43.09 kg/m  Wt Readings from Last 3 Encounters:  08/07/23 267  lb (121.1 kg)  07/30/23 258 lb (117 kg)  06/27/23 258 lb (117 kg)   Physical Exam  Constitutional: She is oriented to person, place, and time. She appears well-developed and well-nourished. No distress.  HENT:  Head: Normocephalic and atraumatic.  Right Ear: Tympanic membrane and ear canal normal.  Left Ear: Tympanic membrane and ear canal normal.  Mouth/Throat: Oropharynx is clear and moist.  Eyes: Pupils are equal, round, and reactive to light. No scleral icterus.  Neck: Normal range of motion. No thyromegaly present.  Cardiovascular: Normal rate and regular rhythm.   No murmur heard. Pulmonary/Chest: Effort normal and breath sounds normal. No respiratory distress. He has no wheezes. She has no rales. She exhibits no tenderness.  Abdominal: Soft. Bowel sounds are normal. She exhibits no distension and no mass. There is no tenderness. There is no rebound and no guarding.  Musculoskeletal: She exhibits no edema.  Lymphadenopathy:    She has no cervical adenopathy.  Neurological: She is alert and oriented to person, place, and time. She has normal patellar reflexes. She exhibits normal muscle tone. Coordination normal.  Skin: Skin is warm and dry.  Psychiatric: She has a normal mood and affect. Her behavior is normal. Judgment and thought content normal.  Breast/pelvic: deferred          Assessment & Plan:       Assessment & Plan:   Problem List Items Addressed This Visit       Unprioritized   Preventative health care - Primary    Due for cholesterol check (declines), needs eye exam, limited exercise due to weather. - Pap up to date - Schedule eye exam. - Encourage regular exercise, (can  use gym due to hot weather outside).      Morbid obesity (HCC)   Following with [facility], working on diet/exercise.      Migraine without aura and without status migrainosus, not intractable   Out of imitrex . Will refill.  She is getting about 5 migraines/month.   Continues Elavil .  Declines referral to Neurology.       Relevant Medications   SUMAtriptan  (IMITREX ) 50 MG tablet   Insulin  resistance   Maintained on metformin .  Lab Results  Component Value Date   HGBA1C 5.3 12/21/2022         Insomnia   Sleeping well with Elavil . Continue same.       Anxiety and depression   Only fair control. Continues counseling and lexapro  10mg . Offered to increase lexapro  dose- she declines at this time.        I have changed Kylie CHARLENA Ware Kylie Ware's SUMAtriptan . I am also having her maintain her ibuprofen, amitriptyline , escitalopram , Vitamin D  (Ergocalciferol ), metFORMIN , and Contrave .  Meds ordered this encounter  Medications   SUMAtriptan  (IMITREX ) 50 MG tablet    Sig: May repeat in 2 hours if headache persists or recurs.1 TABLET BY MOUTH AT START OF MIGRAINE, MAY REPEAT IN 2 HOURS IF NEEDED (MAX 2 TABS/24 HRS)    Dispense:  9 tablet    Refill:  6    Supervising Provider:   DOMENICA BLACKBIRD A [[id number]]

## 2023-08-07 NOTE — Assessment & Plan Note (Signed)
 Following with [facility], working on diet/exercise.

## 2023-08-07 NOTE — Patient Instructions (Signed)
 VISIT SUMMARY:  During your visit, we discussed your migraines, stress management, possible food allergies, and general health maintenance. We have updated your prescriptions and provided recommendations to help manage your symptoms.  YOUR PLAN:  MIGRAINE: You experience migraines a few times a month, which are currently managed with amitriptyline  and Imitrex . -We have prescribed Imitrex  to be picked up at [facility] on [address] [facility]. -Continue taking amitriptyline  at your current dose. -If your migraines worsen, we may consider referring you to a headache specialist.  STRESS: You are experiencing stress related to your work and upcoming mission trip, managed with Lexapro  and counseling. -Continue taking Lexapro  10 mg daily. -Continue attending your counseling sessions.  You have cramping and diarrhea after eating certain foods, likely due to a food intolerance. -Avoid known trigger foods, such as [place] food.  GENERAL HEALTH MAINTENANCE: You are due for a cholesterol check and an eye exam, and your exercise routine is limited due to the weather. -We have ordered a cholesterol test for you. -Please schedule an eye exam. -Try to engage in regular exercise, possibly indoors due to the hot weather.  FOLLOW-UP: Routine follow-up to monitor your health issues. -Schedule a follow-up appointment in six months. -Report any changes or worsening of symptoms.

## 2023-08-07 NOTE — Assessment & Plan Note (Signed)
 Sleeping well with Elavil . Continue same.

## 2023-08-07 NOTE — Assessment & Plan Note (Signed)
 Maintained on metformin .  Lab Results  Component Value Date   HGBA1C 5.3 12/21/2022

## 2023-08-07 NOTE — Assessment & Plan Note (Signed)
  Due for cholesterol check (declines), needs eye exam, limited exercise due to weather. - Pap up to date - Schedule eye exam. - Encourage regular exercise, (can use gym due to hot weather outside).

## 2023-08-08 NOTE — Telephone Encounter (Signed)
 Contrave  was denied by insurance. Per plan it is not a covered benefit.

## 2023-08-13 MED ORDER — CONTRAVE 8-90 MG PO TB12: ORAL_TABLET | ORAL | 0 refills | Status: DC

## 2023-08-13 NOTE — Addendum Note (Signed)
 Addended by: WAYLAN DARICE BRAVO on: 08/13/2023 10:38 AM   Modules accepted: Orders

## 2023-08-14 DIAGNOSIS — F321 Major depressive disorder, single episode, moderate: Secondary | ICD-10-CM | POA: Diagnosis not present

## 2023-08-14 DIAGNOSIS — F411 Generalized anxiety disorder: Secondary | ICD-10-CM | POA: Diagnosis not present

## 2023-08-19 ENCOUNTER — Other Ambulatory Visit: Payer: Self-pay | Admitting: Family

## 2023-08-21 DIAGNOSIS — F321 Major depressive disorder, single episode, moderate: Secondary | ICD-10-CM | POA: Diagnosis not present

## 2023-08-21 DIAGNOSIS — F411 Generalized anxiety disorder: Secondary | ICD-10-CM | POA: Diagnosis not present

## 2023-08-23 ENCOUNTER — Other Ambulatory Visit: Payer: Self-pay | Admitting: Family

## 2023-08-23 ENCOUNTER — Ambulatory Visit: Admitting: Family Medicine

## 2023-08-23 DIAGNOSIS — F32A Depression, unspecified: Secondary | ICD-10-CM

## 2023-08-28 DIAGNOSIS — F321 Major depressive disorder, single episode, moderate: Secondary | ICD-10-CM | POA: Diagnosis not present

## 2023-08-28 DIAGNOSIS — F411 Generalized anxiety disorder: Secondary | ICD-10-CM | POA: Diagnosis not present

## 2023-09-10 ENCOUNTER — Other Ambulatory Visit: Payer: Self-pay | Admitting: Family Medicine

## 2023-09-10 DIAGNOSIS — E66813 Obesity, class 3: Secondary | ICD-10-CM

## 2023-09-14 ENCOUNTER — Encounter: Admitting: Family

## 2023-09-18 DIAGNOSIS — F321 Major depressive disorder, single episode, moderate: Secondary | ICD-10-CM | POA: Diagnosis not present

## 2023-09-18 DIAGNOSIS — F411 Generalized anxiety disorder: Secondary | ICD-10-CM | POA: Diagnosis not present

## 2023-09-25 DIAGNOSIS — F321 Major depressive disorder, single episode, moderate: Secondary | ICD-10-CM | POA: Diagnosis not present

## 2023-09-25 DIAGNOSIS — F411 Generalized anxiety disorder: Secondary | ICD-10-CM | POA: Diagnosis not present

## 2023-10-02 DIAGNOSIS — F411 Generalized anxiety disorder: Secondary | ICD-10-CM | POA: Diagnosis not present

## 2023-10-02 DIAGNOSIS — F321 Major depressive disorder, single episode, moderate: Secondary | ICD-10-CM | POA: Diagnosis not present

## 2023-10-09 DIAGNOSIS — F321 Major depressive disorder, single episode, moderate: Secondary | ICD-10-CM | POA: Diagnosis not present

## 2023-10-09 DIAGNOSIS — F411 Generalized anxiety disorder: Secondary | ICD-10-CM | POA: Diagnosis not present

## 2023-10-16 DIAGNOSIS — F321 Major depressive disorder, single episode, moderate: Secondary | ICD-10-CM | POA: Diagnosis not present

## 2023-10-16 DIAGNOSIS — F411 Generalized anxiety disorder: Secondary | ICD-10-CM | POA: Diagnosis not present

## 2023-10-23 DIAGNOSIS — F411 Generalized anxiety disorder: Secondary | ICD-10-CM | POA: Diagnosis not present

## 2023-10-23 DIAGNOSIS — F321 Major depressive disorder, single episode, moderate: Secondary | ICD-10-CM | POA: Diagnosis not present

## 2023-10-30 ENCOUNTER — Ambulatory Visit: Admitting: Family Medicine

## 2023-10-30 ENCOUNTER — Encounter: Payer: Self-pay | Admitting: Family Medicine

## 2023-10-30 VITALS — BP 126/86 | HR 80 | Temp 97.5°F | Ht 66.5 in | Wt 279.0 lb

## 2023-10-30 DIAGNOSIS — E88819 Insulin resistance, unspecified: Secondary | ICD-10-CM | POA: Diagnosis not present

## 2023-10-30 DIAGNOSIS — F419 Anxiety disorder, unspecified: Secondary | ICD-10-CM

## 2023-10-30 DIAGNOSIS — F32A Depression, unspecified: Secondary | ICD-10-CM | POA: Diagnosis not present

## 2023-10-30 DIAGNOSIS — F321 Major depressive disorder, single episode, moderate: Secondary | ICD-10-CM | POA: Diagnosis not present

## 2023-10-30 DIAGNOSIS — F411 Generalized anxiety disorder: Secondary | ICD-10-CM | POA: Diagnosis not present

## 2023-10-30 DIAGNOSIS — E559 Vitamin D deficiency, unspecified: Secondary | ICD-10-CM

## 2023-10-30 DIAGNOSIS — E66813 Obesity, class 3: Secondary | ICD-10-CM

## 2023-10-30 DIAGNOSIS — Z6841 Body Mass Index (BMI) 40.0 and over, adult: Secondary | ICD-10-CM

## 2023-10-30 MED ORDER — METFORMIN HCL 500 MG PO TABS: 500.0000 mg | ORAL_TABLET | Freq: Every day | ORAL | 1 refills | Status: AC

## 2023-10-30 MED ORDER — PHENTERMINE HCL 15 MG PO CAPS: ORAL_CAPSULE | ORAL | 0 refills | Status: DC

## 2023-10-30 NOTE — Patient Instructions (Signed)
 Keep metformin  500 mg once a day with food  Start Phentermine  15 mg capsule 30 min before breakfast daily  Work on tracking calorie intake: 1600 cal/ day Do not add in your exercise Daily protein goal 100+ g/ day Avoid high sugar foods and drinks Remember fruits and veggies daily Don't skip meals  Think about adding in more daily steps: Use a smart watch Add in a walking pad

## 2023-10-30 NOTE — Progress Notes (Signed)
 Office: [phone]  /  Fax: [phone]  WEIGHT SUMMARY AND BIOMETRICS  Starting Date: 09/27/22  Starting Weight: 248lb   Weight Lost Since Last Visit: 0lb   Vitals Temp: (!) 97.5 F (36.4 C) BP: 126/86 Pulse Rate: 80 SpO2: 96 %   Body Composition  Body Fat %: 51 % Fat Mass (lbs): 142.4 lbs Muscle Mass (lbs): 130 lbs Total Body Water (lbs): 101 lbs Visceral Fat Rating : 14     HPI  Chief Complaint: OBESITY  Kylie Ware is here to discuss her progress with her obesity treatment plan. She is on the the Category 3 Plan and states she is following her eating plan approximately 0 % of the time. She states she is exercising 0 minutes 0 times per week.   Interval History:  Since last office visit she is up 21 lb in 3 mos She has a net weight gain of 31 lb in the past 13 mos of medically supervised weight management She did change to Contrave  last visit and didn't see any appetite control or weight loss She feels like the motivation to make change is still there Her roommate is also struggling with dietary choices She is still on Metformin  500 mg daily She has a lot of stress at home She has been having more nightmares She is not doing any regular exercise and has a sedentary job as an Airline pilot Previously failed to see weight loss on Lomaira   Pharmacotherapy: metformin  500 mg daily; ran out of Contrave  1 mo ago  PHYSICAL EXAM:  Blood pressure 126/86, pulse 80, temperature (!) 97.5 F (36.4 C), height 5' 6.5 (1.689 m), weight 279 lb (126.6 kg), SpO2 96%. Body mass index is 44.36 kg/m.  General: She is overweight, cooperative, alert, well developed, and in no acute distress. PSYCH: Has normal mood, affect and thought process.   Lungs: Normal breathing effort, no conversational dyspnea.   ASSESSMENT AND PLAN  TREATMENT PLAN FOR OBESITY:  Recommended Dietary Goals  Peter is currently in the action stage of change. As such, her goal is to continue  weight management plan. She has agreed to keeping a food journal and adhering to recommended goals of 1600 calories and 100+ grams of protein.  Behavioral Intervention  We discussed the following Behavioral Modification Strategies today: increasing lean protein intake to established goals, increasing fiber rich foods, increasing water intake , work on meal planning and preparation, work on Counselling psychologist calories using tracking application, keeping healthy foods at home, work on managing stress, creating time for self-care and relaxation, avoiding temptations and identifying enticing environmental cues, planning for success, and getting back on track after recent relapse.  Additional resources provided today: NA  Recommended Physical Activity Goals  Maranatha has been advised to work up to 150 minutes of moderate intensity aerobic activity a week and strengthening exercises 2-3 times per week for cardiovascular health, weight loss maintenance and preservation of muscle mass.   She has agreed to Start aerobic activity with a goal of 150 minutes a week at moderate intensity.  and Increase and monitor steps for a goal of 10,000 per day  Pharmacotherapy changes for the treatment of obesity: Begin phentermine  15 mg capsule 30 minutes before breakfast daily PDMP has been reviewed and informed consent was previously signed for Lomaira  Avoid pregnancy while on phentermine   ASSOCIATED CONDITIONS ADDRESSED TODAY  Anxiety and depression Worsening with associated stress eating.  She is still in counseling and on Lexapro  10 mg daily per her PCP.  She lacks a good support system at home.  We discussed the importance of addressing mental health issues and improving sleep and stress reduction.  Keep junk food trigger foods out of the house.  Insulin  resistance She has done well on metformin  without adverse side effects.  She has been taking 500 mg once daily with food.  Repeat chemistry panel and  fasting insulin  next visit -     metFORMIN  HCl; Take 1 tablet (500 mg total) by mouth daily with breakfast.  Dispense: 30 tablet; Refill: 1  Obesity, Class III, BMI 40-49.9 (morbid obesity) -     Phentermine  HCl; 1 capsule po 30 min before breakfast daily  Dispense: 30 capsule; Refill: 0 Patient has failed to respond to both Lomaira  and Contrave .  She lacks insurance coverage for use of a GLP-1 receptor agonist.  She has room for improvement with dietary and exercise compliance.  Barriers to success include lack of a support system, depression and sedentary job.  She remains motivated to actively work on weight reduction and will consider the route of weight loss surgery if not seeing adequate weight loss with medical weight management.  Begin phentermine  15 mg capsule 30 minutes before breakfast daily.  Follow-up in 4 weeks  BMI 40.0-44.9, adult (HCC)  Vitamin D  deficiency She was taking vitamin D  50,000 IU once weekly.  She ran out of her prescription about a week ago and will be due for vitamin D  level next visit.  She does have complaints of fatigue     She was informed of the importance of frequent follow up visits to maximize her success with intensive lifestyle modifications for her multiple health conditions.   ATTESTASTION STATEMENTS:  Reviewed by clinician on day of visit: allergies, medications, problem list, medical history, surgical history, family history, social history, and previous encounter notes pertinent to obesity diagnosis.   I have personally spent 30 minutes total time today in preparation, patient care, nutritional counseling and education,  and documentation for this visit, including the following: review of most recent clinical lab tests, prescribing medications/ refilling medications, reviewing medical assistant documentation, review and interpretation of bioimpedence results.     Darice Haddock, D.O. DABFM, DABOM [facility] [address] [place], [place] [address] [phone]

## 2023-11-06 DIAGNOSIS — F411 Generalized anxiety disorder: Secondary | ICD-10-CM | POA: Diagnosis not present

## 2023-11-06 DIAGNOSIS — F321 Major depressive disorder, single episode, moderate: Secondary | ICD-10-CM | POA: Diagnosis not present

## 2023-11-13 DIAGNOSIS — F411 Generalized anxiety disorder: Secondary | ICD-10-CM | POA: Diagnosis not present

## 2023-11-13 DIAGNOSIS — F321 Major depressive disorder, single episode, moderate: Secondary | ICD-10-CM | POA: Diagnosis not present

## 2023-11-14 ENCOUNTER — Other Ambulatory Visit: Payer: Self-pay | Admitting: Family

## 2023-11-20 ENCOUNTER — Other Ambulatory Visit: Payer: Self-pay | Admitting: Family

## 2023-11-20 DIAGNOSIS — F419 Anxiety disorder, unspecified: Secondary | ICD-10-CM

## 2023-11-27 DIAGNOSIS — F411 Generalized anxiety disorder: Secondary | ICD-10-CM | POA: Diagnosis not present

## 2023-11-27 DIAGNOSIS — F321 Major depressive disorder, single episode, moderate: Secondary | ICD-10-CM | POA: Diagnosis not present

## 2023-11-28 ENCOUNTER — Ambulatory Visit: Admitting: Family Medicine

## 2023-11-30 ENCOUNTER — Other Ambulatory Visit: Payer: Self-pay | Admitting: Family Medicine

## 2023-11-30 DIAGNOSIS — E88819 Insulin resistance, unspecified: Secondary | ICD-10-CM

## 2023-12-04 DIAGNOSIS — F321 Major depressive disorder, single episode, moderate: Secondary | ICD-10-CM | POA: Diagnosis not present

## 2023-12-04 DIAGNOSIS — F411 Generalized anxiety disorder: Secondary | ICD-10-CM | POA: Diagnosis not present

## 2023-12-06 ENCOUNTER — Encounter: Payer: Self-pay | Admitting: Family Medicine

## 2023-12-06 ENCOUNTER — Ambulatory Visit: Admitting: Family Medicine

## 2023-12-06 VITALS — BP 125/85 | HR 89 | Temp 98.0°F | Ht 66.5 in | Wt 286.0 lb

## 2023-12-06 DIAGNOSIS — Z6841 Body Mass Index (BMI) 40.0 and over, adult: Secondary | ICD-10-CM

## 2023-12-06 DIAGNOSIS — E559 Vitamin D deficiency, unspecified: Secondary | ICD-10-CM

## 2023-12-06 DIAGNOSIS — E88819 Insulin resistance, unspecified: Secondary | ICD-10-CM | POA: Diagnosis not present

## 2023-12-06 DIAGNOSIS — Z9189 Other specified personal risk factors, not elsewhere classified: Secondary | ICD-10-CM

## 2023-12-06 DIAGNOSIS — E66813 Obesity, class 3: Secondary | ICD-10-CM

## 2023-12-06 DIAGNOSIS — F32A Depression, unspecified: Secondary | ICD-10-CM

## 2023-12-06 DIAGNOSIS — F419 Anxiety disorder, unspecified: Secondary | ICD-10-CM | POA: Diagnosis not present

## 2023-12-06 DIAGNOSIS — Z7289 Other problems related to lifestyle: Secondary | ICD-10-CM

## 2023-12-06 NOTE — Progress Notes (Signed)
 Office: [phone]  /  Fax: [phone]  WEIGHT SUMMARY AND BIOMETRICS  Starting Date: 09/27/22  Starting Weight: 248lb   Weight Lost Since Last Visit: 0lb   Vitals Temp: 98 F (36.7 C) BP: 125/85 Pulse Rate: 89 SpO2: 97 %   Body Composition  Body Fat %: 51.9 % Fat Mass (lbs): 148.8 lbs Muscle Mass (lbs): 130.8 lbs Total Body Water (lbs): 103 lbs Visceral Fat Rating : 15     HPI  Chief Complaint: OBESITY  Kylie Ware is here to discuss her progress with her obesity treatment plan. She is on the keeping a food journal and adhering to recommended goals of 1600 calories and 100 protein and states she is following her eating plan approximately 25 % of the time. She states she is exercising 30 minutes 2 times per week.  Interval History:  Since last office visit she is up 7 lb This gives her a net weight gain of 38 lb in 14 mos of medically supervised weight management She has had more financial strain, eating out more, poor support at home and at work She is getting in all of her meals but seems to be oversnacking She is getting in some fruits and veggies She has been doing some walking She has been limited some exercise due to her work schedule She did try Phentermine  15 mg qAM with some improvements in appetite control She didn't see any changes in axiety  Pharmacotherapy: phentermine  15 mg daily, Metformin  XR 500 mg daily Previously failed to progress with Lomaira  and contrave  [employer] coverage for GLP-1 RA  PHYSICAL EXAM:  Blood pressure 125/85, pulse 89, temperature 98 F (36.7 C), height 5' 6.5 (1.689 m), weight 286 lb (129.7 kg), SpO2 97%. Body mass index is 45.47 kg/m.  General: She is overweight, cooperative, alert, well developed, and in no acute distress. PSYCH: Has normal mood, affect and thought process.   Lungs: Normal breathing effort, no conversational dyspnea.   ASSESSMENT AND PLAN  TREATMENT PLAN FOR OBESITY:  Recommended  Dietary Goals  Kylie Ware is currently in the action stage of change. As such, her goal is to continue weight management plan. She has agreed to keeping a food journal and adhering to recommended goals of 1600 calories and 100 g of  protein.  Behavioral Intervention  We discussed the following Behavioral Modification Strategies today: increasing lean protein intake to established goals, increasing water intake , work on meal planning and preparation, work on tracking and journaling calories using tracking application, keeping healthy foods at home, work on managing stress, creating time for self-care and relaxation, avoiding temptations and identifying enticing environmental cues, and planning for success.  Additional resources provided today: NA  Recommended Physical Activity Goals  Kylie Ware has been advised to work up to 150 minutes of moderate intensity aerobic activity a week and strengthening exercises 2-3 times per week for cardiovascular health, weight loss maintenance and preservation of muscle mass.   She has agreed to Start aerobic activity with a goal of 150 minutes a week at moderate intensity.   Pharmacotherapy changes for the treatment of obesity: d/c Phentermine  due to lack of weight loss  ASSOCIATED CONDITIONS ADDRESSED TODAY  Vitamin D  deficiency Last vitamin D  Lab Results  Component Value Date   VD25OH 44.9 05/28/2023  She has been taking OTC vitamin D  2,000 international units  daily for maintenance, repeat lab today  -     VITAMIN D  25 Hydroxy (Vit-D Deficiency, Fractures)  Insulin  resistance Doing well on  metformin  XR 500 mg daily Repeat fasting insulin  and BMP today Has room for improvement with diet and exercise changes -     Insulin , random -     Basic metabolic panel with GFR -     Vitamin B12  Obesity, Class III, BMI 40-49.9 (morbid obesity) (HCC) Failing to see improving and has been gaining weight in the past year We discussed the role of bariatric  surgery She declined ref to PREP program due to her work schedule Begin tracking calories and prioritize time for walking  Anxiety and depression Stable on Lexapro  10 mg daily Continue current meds and counseling  Sedentary lifestyle unchanged     She was informed of the importance of frequent follow up visits to maximize her success with intensive lifestyle modifications for her multiple health conditions.   ATTESTASTION STATEMENTS:  Reviewed by clinician on day of visit: allergies, medications, problem list, medical history, surgical history, family history, social history, and previous encounter notes pertinent to obesity diagnosis.   I have personally spent 30 minutes total time today in preparation, patient care, nutritional counseling and education,  and documentation for this visit, including the following: review of most recent clinical lab tests, prescribing medications/ refilling medications, reviewing medical assistant documentation, review and interpretation of bioimpedence results.     Darice Haddock, D.O. DABFM, DABOM [facility] [address] [place], [place] [address] [phone]

## 2023-12-08 LAB — BASIC METABOLIC PANEL WITH GFR
BUN/Creatinine Ratio: 20 (ref 9–23)
BUN: 12 mg/dL (ref 6–20)
CO2: 23 mmol/L (ref 20–29)
Calcium: 9.6 mg/dL (ref 8.7–10.2)
Chloride: 102 mmol/L (ref 96–106)
Creatinine, Ser: 0.59 mg/dL (ref 0.57–1.00)
Glucose: 82 mg/dL (ref 70–99)
Potassium: 4.3 mmol/L (ref 3.5–5.2)
Sodium: 138 mmol/L (ref 134–144)
eGFR: 126 mL/min/1.73 (ref >59)

## 2023-12-08 LAB — VITAMIN D 25 HYDROXY (VIT D DEFICIENCY, FRACTURES): Vit D, 25-Hydroxy: 14.8 ng/mL — ABNORMAL LOW (ref 30.0–100.0)

## 2023-12-08 LAB — INSULIN, RANDOM: INSULIN: 14.2 u[IU]/mL (ref 2.6–24.9)

## 2023-12-08 LAB — VITAMIN B12: Vitamin B-12: 616 pg/mL (ref 232–1245)

## 2023-12-10 ENCOUNTER — Ambulatory Visit: Payer: Self-pay | Admitting: Family Medicine

## 2023-12-11 DIAGNOSIS — F321 Major depressive disorder, single episode, moderate: Secondary | ICD-10-CM | POA: Diagnosis not present

## 2023-12-11 DIAGNOSIS — F411 Generalized anxiety disorder: Secondary | ICD-10-CM | POA: Diagnosis not present

## 2023-12-25 DIAGNOSIS — F411 Generalized anxiety disorder: Secondary | ICD-10-CM | POA: Diagnosis not present

## 2023-12-25 DIAGNOSIS — F321 Major depressive disorder, single episode, moderate: Secondary | ICD-10-CM | POA: Diagnosis not present

## 2023-12-31 ENCOUNTER — Ambulatory Visit: Admitting: Family Medicine

## 2024-01-01 DIAGNOSIS — F321 Major depressive disorder, single episode, moderate: Secondary | ICD-10-CM | POA: Diagnosis not present

## 2024-01-01 DIAGNOSIS — F411 Generalized anxiety disorder: Secondary | ICD-10-CM | POA: Diagnosis not present

## 2024-01-11 ENCOUNTER — Other Ambulatory Visit: Payer: Self-pay | Admitting: Family Medicine

## 2024-01-11 DIAGNOSIS — E88819 Insulin resistance, unspecified: Secondary | ICD-10-CM

## 2024-01-15 DIAGNOSIS — F321 Major depressive disorder, single episode, moderate: Secondary | ICD-10-CM | POA: Diagnosis not present

## 2024-01-15 DIAGNOSIS — F411 Generalized anxiety disorder: Secondary | ICD-10-CM | POA: Diagnosis not present

## 2024-02-13 ENCOUNTER — Other Ambulatory Visit: Payer: Self-pay | Admitting: Family

## 2024-02-26 ENCOUNTER — Other Ambulatory Visit: Payer: Self-pay | Admitting: Family

## 2024-02-26 DIAGNOSIS — F419 Anxiety disorder, unspecified: Secondary | ICD-10-CM

## 2024-02-26 NOTE — Telephone Encounter (Signed)
Please contact pt to schedule follow up visit.

## 2024-02-26 NOTE — Telephone Encounter (Signed)
 Lvm to sched
